# Patient Record
Sex: Female | Born: 1995 | Race: Black or African American | Hispanic: No | Marital: Single | Smoking: Never smoker
Health system: Southern US, Community
[De-identification: ages and names within clinical notes are randomized; demographics above are authoritative.]

## PROBLEM LIST (undated history)

## (undated) ENCOUNTER — Inpatient Hospital Stay (HOSPITAL_COMMUNITY): Payer: Self-pay

## (undated) DIAGNOSIS — F32A Depression, unspecified: Secondary | ICD-10-CM

## (undated) DIAGNOSIS — E669 Obesity, unspecified: Secondary | ICD-10-CM

## (undated) DIAGNOSIS — K8020 Calculus of gallbladder without cholecystitis without obstruction: Secondary | ICD-10-CM

## (undated) DIAGNOSIS — E559 Vitamin D deficiency, unspecified: Secondary | ICD-10-CM

## (undated) DIAGNOSIS — G43109 Migraine with aura, not intractable, without status migrainosus: Secondary | ICD-10-CM

## (undated) DIAGNOSIS — N946 Dysmenorrhea, unspecified: Secondary | ICD-10-CM

## (undated) DIAGNOSIS — F419 Anxiety disorder, unspecified: Secondary | ICD-10-CM

## (undated) HISTORY — DX: Dysmenorrhea, unspecified: N94.6

## (undated) HISTORY — PX: TONSILLECTOMY AND ADENOIDECTOMY: SUR1326

## (undated) HISTORY — PX: OTHER SURGICAL HISTORY: SHX169

## (undated) HISTORY — PX: HERNIA REPAIR: SHX51

## (undated) HISTORY — DX: Migraine with aura, not intractable, without status migrainosus: G43.109

## (undated) HISTORY — DX: Vitamin D deficiency, unspecified: E55.9

---
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
## 2016-03-16 ENCOUNTER — Ambulatory Visit: Payer: Self-pay

## 2016-03-18 ENCOUNTER — Ambulatory Visit (INDEPENDENT_AMBULATORY_CARE_PROVIDER_SITE_OTHER): Payer: BLUE CROSS/BLUE SHIELD

## 2016-03-18 DIAGNOSIS — Z3042 Encounter for surveillance of injectable contraceptive: Secondary | ICD-10-CM | POA: Diagnosis not present

## 2016-03-18 DIAGNOSIS — N912 Amenorrhea, unspecified: Secondary | ICD-10-CM

## 2016-03-18 LAB — POCT URINE PREGNANCY: PREG TEST UR: NEGATIVE

## 2016-03-18 MED ORDER — MEDROXYPROGESTERONE ACETATE 150 MG/ML IM SUSP
150.0000 mg | Freq: Once | INTRAMUSCULAR | Status: AC
  Administered 2016-03-18: 150 mg via INTRAMUSCULAR

## 2016-03-18 NOTE — Progress Notes (Signed)
Patient presents today for 2nd depo provera injection. Her last injection was 13 weeks and 2 days ago on 12/16/15. Per protocol, a urine pregnancy test was performed. Results were negative. Depo Provera given as noted in MAR.

## 2016-03-19 ENCOUNTER — Ambulatory Visit: Payer: Self-pay

## 2016-06-10 ENCOUNTER — Ambulatory Visit: Payer: Self-pay

## 2016-06-16 ENCOUNTER — Ambulatory Visit (INDEPENDENT_AMBULATORY_CARE_PROVIDER_SITE_OTHER): Payer: BLUE CROSS/BLUE SHIELD

## 2016-06-16 DIAGNOSIS — Z3042 Encounter for surveillance of injectable contraceptive: Secondary | ICD-10-CM | POA: Diagnosis not present

## 2016-06-16 MED ORDER — MEDROXYPROGESTERONE ACETATE 150 MG/ML IM SUSP
150.0000 mg | Freq: Once | INTRAMUSCULAR | Status: AC
  Administered 2016-06-16: 150 mg via INTRAMUSCULAR

## 2016-09-08 ENCOUNTER — Ambulatory Visit: Payer: BLUE CROSS/BLUE SHIELD

## 2016-09-21 ENCOUNTER — Ambulatory Visit: Payer: BLUE CROSS/BLUE SHIELD

## 2016-09-22 ENCOUNTER — Ambulatory Visit (INDEPENDENT_AMBULATORY_CARE_PROVIDER_SITE_OTHER): Payer: BLUE CROSS/BLUE SHIELD

## 2016-09-22 DIAGNOSIS — Z308 Encounter for other contraceptive management: Secondary | ICD-10-CM | POA: Diagnosis not present

## 2016-09-22 LAB — POCT PREGNANCY, URINE: Preg Test, Ur: NEGATIVE

## 2016-09-22 MED ORDER — MEDROXYPROGESTERONE ACETATE 150 MG/ML IM SUSP
150.0000 mg | Freq: Once | INTRAMUSCULAR | Status: AC
  Administered 2016-09-22: 150 mg via INTRAMUSCULAR

## 2016-09-22 NOTE — Progress Notes (Signed)
Pt here for depo which was given IM right glut after neg urine preg test.  NDC# [address]-2 

## 2016-11-05 ENCOUNTER — Ambulatory Visit: Payer: BLUE CROSS/BLUE SHIELD | Admitting: Maternal Newborn

## 2016-11-18 ENCOUNTER — Ambulatory Visit: Payer: BLUE CROSS/BLUE SHIELD | Admitting: Maternal Newborn

## 2016-12-15 ENCOUNTER — Ambulatory Visit: Payer: BLUE CROSS/BLUE SHIELD

## 2017-01-05 HISTORY — PX: WISDOM TOOTH EXTRACTION: SHX21

## 2017-02-08 ENCOUNTER — Other Ambulatory Visit: Payer: Self-pay

## 2017-02-08 ENCOUNTER — Emergency Department (HOSPITAL_COMMUNITY)
Admission: EM | Admit: 2017-02-08 | Discharge: 2017-02-08 | Disposition: A | Discharge status: Home / Self Care | Payer: BLUE CROSS/BLUE SHIELD | Attending: Emergency Medicine | Admitting: Emergency Medicine

## 2017-02-08 ENCOUNTER — Encounter (HOSPITAL_COMMUNITY): Payer: Self-pay | Admitting: Emergency Medicine

## 2017-02-08 DIAGNOSIS — K0889 Other specified disorders of teeth and supporting structures: Secondary | ICD-10-CM | POA: Diagnosis not present

## 2017-02-08 MED ORDER — OXYCODONE-ACETAMINOPHEN 5-325 MG PO TABS
1.0000 | ORAL_TABLET | ORAL | Status: DC | PRN
  Administered 2017-02-08: 1 via ORAL
  Filled 2017-02-08: qty 1

## 2017-02-08 MED ORDER — NAPROXEN 500 MG PO TABS: 500.0000 mg | ORAL_TABLET | Freq: Two times a day (BID) | ORAL | 0 refills | Status: DC

## 2017-02-08 NOTE — Discharge Instructions (Signed)
Please go to your follow-up appointment with the oral surgeon.  Please take Naprosyn 500 mg twice a day.  Return to the ER if you have fever greater than 100.4 F, neck swelling, face swelling or have any new or worsening symptoms.

## 2017-02-08 NOTE — ED Triage Notes (Signed)
Pt mother sts pt had 4 wisdom teeth pulled last week. Pt is out of medication and needs something to help. Pt sts that her L ear also has pain along with he throat.

## 2017-02-08 NOTE — ED Provider Notes (Signed)
[facility] Provider Note   CSN: [id number] Arrival date & time: 02/08/17  [id number]     History   Chief Complaint Chief Complaint  Patient presents with  . Dental Pain    HPI Paige Blanchard is a 22 y.o. female.  HPI   Ms. Paige Blanchard is a 22 year old female with a history of migraines who presents to the emergency department for evaluation of bilateral upper and lower mouth pain.  Patient states that she had her 4 wisdom teeth removed 4 days ago and has had persistent pain ever since.  Pain is 10/10 in severity and throbbing.  Her pain radiates into the left ear at times.  Worsened with chewing. Has been taking ibuprofen every 6 hours for pain along with acetaminophen-codeine prescribed by her oral surgeon. Tried percocet in the ER without relief. 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  States that she has a follow-up appointment on Friday.  She denies fevers, chills, facial swelling, neck pain or swelling, dysphagia, trismus.  Past Medical History:  Diagnosis Date  . Dysmenorrhea   . Migraine with aura   . Vitamin D deficiency     There are no active problems to display for this patient.   Past Surgical History:  Procedure Laterality Date  . HERNIA REPAIR     AGE 71  . TONSILLECTOMY AND ADENOIDECTOMY    . TUBES IN EAR      OB History    Gravida Para Term Preterm AB Living   0 0 0 0 0 0   SAB TAB Ectopic Multiple Live Births   0 0 0 0 0       Home Medications    Prior to Admission medications   Not on File    Family History Family History  Problem Relation Age of Onset  . Diabetes Mother   . Hypertension Mother   . Heart disease Father   . Diabetes Maternal Grandmother        TYPE 2  . Diabetes Maternal Grandfather        TYPE 2  . Breast cancer Other   . Cancer Other        PROSTATE    Social History Social History   Tobacco Use  . Smoking status: Never Smoker  . Smokeless tobacco:  Never Used  Substance Use Topics  . Alcohol use: No    Frequency: Never  . Drug use: No     Allergies   Peanut oil   Review of Systems Review of Systems  Constitutional: Negative for chills and fever.  HENT: Positive for dental problem and ear pain. Negative for ear discharge, facial swelling, hearing loss and trouble swallowing.   Respiratory: Negative for shortness of breath.      Physical Exam Updated Vital Signs BP (!) 137/92 (BP Location: Right Arm)   Pulse (!) 109   Temp 98.2 F (36.8 C) (Oral)   SpO2 99%   Physical Exam  Constitutional: She appears well-developed and well-nourished. No distress.  HENT:  Head: Normocephalic and atraumatic.  Mouth/Throat:    Wisdom teeth absent. Pain along gums as depicted in image. No gingival erythema or abscess noted. Midline uvula. No trismus. OP moist and clear. No oropharyngeal erythema or edema. Neck supple with no tenderness. No facial edema.  Eyes: Conjunctivae are normal. Pupils are equal, round, and reactive to light. Right eye exhibits no discharge. Left eye exhibits no discharge.  Neck:  Normal range of motion. Neck supple.  Cardiovascular: Normal rate and regular rhythm. Exam reveals no friction rub.  No murmur heard. Pulmonary/Chest: Effort normal and breath sounds normal. No stridor. No respiratory distress. She has no wheezes. She has no rales.  Lymphadenopathy:    She has no cervical adenopathy.  Neurological: She is alert. Coordination normal.  Skin: She is not diaphoretic.  Psychiatric: She has a normal mood and affect. Her behavior is normal.  Nursing note and vitals reviewed.    ED Treatments / Results  Labs (all labs ordered are listed, but only abnormal results are displayed) Labs Reviewed - No data to display  EKG  EKG Interpretation None       Radiology No results found.  Procedures Procedures (including critical care time)  Medications Ordered in ED Medications    oxyCODONE-acetaminophen (PERCOCET/ROXICET) 5-325 MG per tablet 1 tablet (1 tablet Oral Given 02/08/17 1028)     Initial Impression / Assessment and Plan / ED Course  I have reviewed the triage vital signs and the nursing notes.  Pertinent labs & imaging results that were available during my care of the patient were reviewed by me and considered in my medical decision making (see chart for details).    Patient with dental pain after wisdom tooth extraction four days ago.  No gross abscess or signs of infection.  Exam unconcerning for Ludwig's angina or spread of infection.  Will treat with anti-inflammatories medicine and have her follow up with oral surgeon for further evaluation and pain management.  Discussed return precautions and patient agrees to plan.     Final Clinical Impressions(s) / ED Diagnoses   Final diagnoses:  Pain, dental    ED Discharge Orders        Ordered    naproxen (NAPROSYN) 500 MG tablet  2 times daily     02/08/17 1407       Lawrence Marseilles 02/08/17 1409    Raeford Razor, MD 02/09/17 [phone]

## 2017-03-03 ENCOUNTER — Encounter (HOSPITAL_COMMUNITY): Payer: Self-pay | Admitting: Emergency Medicine

## 2017-03-03 ENCOUNTER — Emergency Department (HOSPITAL_COMMUNITY)
Admission: EM | Admit: 2017-03-03 | Discharge: 2017-03-03 | Disposition: A | Discharge status: Home / Self Care | Payer: BLUE CROSS/BLUE SHIELD | Attending: Emergency Medicine | Admitting: Emergency Medicine

## 2017-03-03 DIAGNOSIS — R55 Syncope and collapse: Secondary | ICD-10-CM | POA: Insufficient documentation

## 2017-03-03 DIAGNOSIS — Z5321 Procedure and treatment not carried out due to patient leaving prior to being seen by health care provider: Secondary | ICD-10-CM | POA: Insufficient documentation

## 2017-03-03 LAB — BASIC METABOLIC PANEL
ANION GAP: 8 (ref 5–15)
BUN: 14 mg/dL (ref 6–20)
CHLORIDE: 107 mmol/L (ref 101–111)
CO2: 24 mmol/L (ref 22–32)
Calcium: 9.1 mg/dL (ref 8.9–10.3)
Creatinine, Ser: 0.92 mg/dL (ref 0.44–1.00)
GFR calc Af Amer: >60 mL/min (ref >60)
GFR calc non Af Amer: >60 mL/min (ref >60)
Glucose, Bld: 113 mg/dL — ABNORMAL HIGH (ref 65–99)
POTASSIUM: 4.2 mmol/L (ref 3.5–5.1)
SODIUM: 139 mmol/L (ref 135–145)

## 2017-03-03 LAB — URINALYSIS, ROUTINE W REFLEX MICROSCOPIC
Bilirubin Urine: NEGATIVE
GLUCOSE, UA: NEGATIVE mg/dL
Hgb urine dipstick: NEGATIVE
Ketones, ur: NEGATIVE mg/dL
LEUKOCYTES UA: NEGATIVE
Nitrite: NEGATIVE
Protein, ur: NEGATIVE mg/dL
SPECIFIC GRAVITY, URINE: 1.018 (ref 1.005–1.030)
pH: 6 (ref 5.0–8.0)

## 2017-03-03 LAB — CBC
HCT: 38.3 % (ref 36.0–46.0)
HEMOGLOBIN: 13.2 g/dL (ref 12.0–15.0)
MCH: 32.6 pg (ref 26.0–34.0)
MCHC: 34.5 g/dL (ref 30.0–36.0)
MCV: 94.6 fL (ref 78.0–100.0)
Platelets: 268 10*3/uL (ref 150–400)
RBC: 4.05 MIL/uL (ref 3.87–5.11)
RDW: 13.3 % (ref 11.5–15.5)
WBC: 4.8 10*3/uL (ref 4.0–10.5)

## 2017-03-03 LAB — I-STAT BETA HCG BLOOD, ED (MC, WL, AP ONLY)

## 2017-03-03 MED ORDER — ONDANSETRON 4 MG PO TBDP
4.0000 mg | ORAL_TABLET | Freq: Once | ORAL | Status: AC
  Administered 2017-03-03: 4 mg via ORAL
  Filled 2017-03-03: qty 1

## 2017-03-03 NOTE — ED Notes (Signed)
Pt decided to leave without being seen by a Dr. Rock NephewPt was urged to stay but she refused.

## 2017-03-03 NOTE — ED Triage Notes (Signed)
Pt reports she had syncopal episode lasting approx 5 minutes. Witnessed by her roommate. Pt now states she is dizzy, lightheaded, nauseous. Pt dry heaving in triage. Pt also reports HA, states she has hx of migraines.

## 2017-03-04 NOTE — ED Notes (Signed)
03/04/2017, 15:20, follow -up call completed.

## 2017-03-14 HISTORY — PX: OTHER SURGICAL HISTORY: SHX169

## 2017-03-19 ENCOUNTER — Ambulatory Visit: Payer: BLUE CROSS/BLUE SHIELD | Admitting: Maternal Newborn

## 2017-03-24 ENCOUNTER — Encounter: Payer: Self-pay | Admitting: Maternal Newborn

## 2017-03-24 ENCOUNTER — Ambulatory Visit (INDEPENDENT_AMBULATORY_CARE_PROVIDER_SITE_OTHER): Payer: BLUE CROSS/BLUE SHIELD | Admitting: Maternal Newborn

## 2017-03-24 VITALS — BP 120/90 | HR 84 | Ht 64.0 in | Wt 288.0 lb

## 2017-03-24 DIAGNOSIS — Z3009 Encounter for other general counseling and advice on contraception: Secondary | ICD-10-CM | POA: Diagnosis not present

## 2017-03-24 NOTE — Progress Notes (Signed)
Obstetrics & Gynecology Office Visit   Chief Complaint:  Chief Complaint  Patient presents with  . Contraception    overdue for annual    History of Present Illness: Patient stopped Depo Provera in December. Does not desire to restart due to weight gain and other side effects. Not a candidate for estrogen containing contraception as she has migraines with aura. Wants to discuss other forms of birth control.  Review of Systems: 10 point review of systems negative unless otherwise noted in HPI.  Past Medical History:  Past Medical History:  Diagnosis Date  . Dysmenorrhea   . Migraine with aura   . Vitamin D deficiency     Past Surgical History:  Past Surgical History:  Procedure Laterality Date  . abcessed tooth  03/14/2017   drained in ED of [facility]  . HERNIA REPAIR     AGE 22  . TONSILLECTOMY AND ADENOIDECTOMY    . TUBES IN EAR    . WISDOM TOOTH EXTRACTION  01/2017    Gynecologic History: Patient's last menstrual period was 03/01/2017.  Obstetric History: G0P0000  Family History:  Family History  Problem Relation Age of Onset  . Diabetes Mother   . Hypertension Mother   . Heart disease Father   . Diabetes Maternal Grandmother        TYPE 2  . Diabetes Maternal Grandfather        TYPE 2  . Breast cancer Other   . Cancer Other        PROSTATE    Social History:  Social History   Socioeconomic History  . Marital status: Single    Spouse name: Not on file  . Number of children: 0  . Years of education: Not on file  . Highest education level: Not on file  Occupational History  . Not on file  Social Needs  . Financial resource strain: Not on file  . Food insecurity:    Worry: Not on file    Inability: Not on file  . Transportation needs:    Medical: Not on file    Non-medical: Not on file  Tobacco Use  . Smoking status: Never Smoker  . Smokeless tobacco: Never Used  Substance and Sexual Activity  . Alcohol use: No    Frequency: Never  . Drug  use: No  . Sexual activity: Yes    Birth control/protection: Injection, None  Lifestyle  . Physical activity:    Days per week: Not on file    Minutes per session: Not on file  . Stress: Not on file  Relationships  . Social connections:    Talks on phone: Not on file    Gets together: Not on file    Attends religious service: Not on file    Active member of club or organization: Not on file    Attends meetings of clubs or organizations: Not on file    Relationship status: Not on file  . Intimate partner violence:    Fear of current or ex partner: Not on file    Emotionally abused: Not on file    Physically abused: Not on file    Forced sexual activity: Not on file  Other Topics Concern  . Not on file  Social History Narrative  . Not on file    Allergies:  Allergies  Allergen Reactions  . Peanut Oil Hives    Medications: Prior to Admission medications   Medication Sig Start Date End Date Taking? Authorizing Provider  naproxen (NAPROSYN) 500 MG tablet Take 1 tablet (500 mg total) by mouth 2 (two) times daily. 02/08/17   Kellie Shropshire, PA-C    Physical Exam Vitals:  Vitals:   03/24/17 1109  BP: 120/90  Pulse: 84   Patient's last menstrual period was 03/01/2017.  General: NAD HEENT: normocephalic, anicteric Pulmonary: No increased work of breathing Genitourinary: deferred Neurologic: Grossly intact Psychiatric: mood appropriate, affect full  Assessment: 22 y.o. G0P0000 presents for contraceptive counseling.  Plan: Problem List Items Addressed This Visit    None    Visit Diagnoses    Encounter for other general counseling or advice on contraception    -  Primary     Discussed birth control options including over the counter/barrier methods; hormonal contraceptive medication including injection, contraceptive implant; and hormonal and nonhormonal IUDs. Efficacy, risks, side effects and benefits reviewed.  Questions were answered.  Written information and  brochures were given to patient to review. She is currently considering a [place] IUD. Advised to use condoms until she makes her decision.  A total of 15 minutes were spent in face-to-face contact with the patient during this encounter with over half of that time devoted to counseling and coordination of care.  Marcelyn Bruins, CNM 03/24/2017

## 2017-03-30 ENCOUNTER — Encounter: Payer: Self-pay | Admitting: Maternal Newborn

## 2017-04-05 ENCOUNTER — Ambulatory Visit: Payer: BLUE CROSS/BLUE SHIELD | Admitting: Maternal Newborn

## 2017-04-22 ENCOUNTER — Encounter: Payer: Self-pay | Admitting: Obstetrics and Gynecology

## 2017-04-22 ENCOUNTER — Ambulatory Visit (INDEPENDENT_AMBULATORY_CARE_PROVIDER_SITE_OTHER): Payer: BLUE CROSS/BLUE SHIELD | Admitting: Obstetrics and Gynecology

## 2017-04-22 VITALS — BP 126/74 | Ht 64.0 in | Wt 288.0 lb

## 2017-04-22 DIAGNOSIS — Z1339 Encounter for screening examination for other mental health and behavioral disorders: Secondary | ICD-10-CM | POA: Diagnosis not present

## 2017-04-22 DIAGNOSIS — Z01419 Encounter for gynecological examination (general) (routine) without abnormal findings: Secondary | ICD-10-CM | POA: Diagnosis not present

## 2017-04-22 DIAGNOSIS — N926 Irregular menstruation, unspecified: Secondary | ICD-10-CM | POA: Diagnosis not present

## 2017-04-22 DIAGNOSIS — Z124 Encounter for screening for malignant neoplasm of cervix: Secondary | ICD-10-CM | POA: Diagnosis not present

## 2017-04-22 DIAGNOSIS — Z1331 Encounter for screening for depression: Secondary | ICD-10-CM

## 2017-04-22 DIAGNOSIS — Z113 Encounter for screening for infections with a predominantly sexual mode of transmission: Secondary | ICD-10-CM | POA: Diagnosis not present

## 2017-04-22 LAB — POCT URINE PREGNANCY: Preg Test, Ur: NEGATIVE

## 2017-04-22 NOTE — Progress Notes (Signed)
Gynecology Annual Exam  PCP: Patient, No Pcp Per  Chief Complaint  Patient presents with  . Annual Exam   History of Present Illness:  Ms. Hilma FavorsKeziah R Ketterman is a 22 y.o. G0P0000 who LMP was Patient's last menstrual period was 03/07/2017., presents today for her annual examination.  Her menses are regular every 24 days, lasting 5 day(s).  Dysmenorrhea mild, occurring first 1-2 days of flow. She does not have intermenstrual bleeding. She is late for her most recent period. She took one home UPT that was positive, one that was "unresponsive" and one that was negative. She did pass some big clots on 03/16/17.  Her last depo provera dose in 12/2016 after taking it for one year.    She is single partner, contraception - none.  Last Pap: never   Hx of STDs: none  There is no FH of breast cancer. There is no FH of ovarian cancer. The patient does not do self-breast exams.  Tobacco use: The patient denies current or previous tobacco use. Alcohol use: none Exercise: moderately active  The patient wears seatbelts: yes.   The patient reports that domestic violence in her life is absent.   Past Medical History:  Diagnosis Date  . Dysmenorrhea   . Migraine with aura   . Vitamin D deficiency     Past Surgical History:  Procedure Laterality Date  . abcessed tooth  03/14/2017   drained in ED of [facility]  . HERNIA REPAIR     AGE 22  . TONSILLECTOMY AND ADENOIDECTOMY    . TUBES IN EAR    . WISDOM TOOTH EXTRACTION  01/2017    Medications   Medication Sig Start Date End Date Taking? Authorizing Provider  albuterol (PROVENTIL HFA) 108 (90 Base) MCG/ACT inhaler Inhale 2 puffs into the lungs as needed. 03/12/15  Yes [provider]  Cholecalciferol (VITAMIN D3) 1000 units CAPS Take 1 capsule by mouth daily. 11/15/16 11/15/17 Yes [provider]  topiramate (TOPAMAX) 50 MG tablet Take 200 mg by mouth daily. 03/17/17 03/17/18 Yes [provider]    Allergies  Allergen  Reactions  . Peanut Oil Hives   Obstetric History: G0P0000  Social History   Socioeconomic History  . Marital status: Single    Spouse name: Not on file  . Number of children: 0  . Years of education: Not on file  . Highest education level: Not on file  Occupational History  . Not on file  Social Needs  . Financial resource strain: Not on file  . Food insecurity:    Worry: Not on file    Inability: Not on file  . Transportation needs:    Medical: Not on file    Non-medical: Not on file  Tobacco Use  . Smoking status: Never Smoker  . Smokeless tobacco: Never Used  Substance and Sexual Activity  . Alcohol use: No    Frequency: Never  . Drug use: No  . Sexual activity: Yes    Birth control/protection: None  Lifestyle  . Physical activity:    Days per week: Not on file    Minutes per session: Not on file  . Stress: Not on file  Relationships  . Social connections:    Talks on phone: Not on file    Gets together: Not on file    Attends religious service: Not on file    Active member of club or organization: Not on file    Attends meetings of clubs or  organizations: Not on file    Relationship status: Not on file  . Intimate partner violence:    Fear of current or ex partner: Not on file    Emotionally abused: Not on file    Physically abused: Not on file    Forced sexual activity: Not on file  Other Topics Concern  . Not on file  Social History Narrative  . Not on file    Family History  Problem Relation Age of Onset  . Diabetes Mother   . Hypertension Mother   . Heart disease Father   . Diabetes Maternal Grandmother        TYPE 2  . Diabetes Maternal Grandfather        TYPE 2  . Breast cancer Other   . Cancer Other        PROSTATE    Review of Systems  Constitutional: Negative.   HENT: Negative.   Eyes: Negative.   Respiratory: Negative.   Cardiovascular: Negative.   Gastrointestinal: Negative.   Genitourinary: Negative.   Musculoskeletal:  Negative.   Skin: Negative.   Neurological: Negative.   Psychiatric/Behavioral: Negative.      Physical Exam BP 126/74   Ht 5\' 4"  (1.626 m)   Wt 288 lb (130.6 kg)   LMP 03/07/2017   BMI 49.44 kg/m    Physical Exam  Constitutional: She is oriented to person, place, and time. She appears well-developed and well-nourished. No distress.  Genitourinary: Uterus normal. Pelvic exam was performed with patient supine. There is no rash, tenderness, lesion or injury on the right labia. There is no rash, tenderness, lesion or injury on the left labia. No erythema, tenderness or bleeding in the vagina. No signs of injury around the vagina. No vaginal discharge found. Right adnexum does not display mass, does not display tenderness and does not display fullness. Left adnexum does not display mass, does not display tenderness and does not display fullness. Cervix does not exhibit motion tenderness, lesion, discharge or polyp.   Uterus is mobile and anteverted. Uterus is not enlarged, tender or exhibiting a mass.  HENT:  Head: Normocephalic and atraumatic.  Eyes: EOM are normal. No scleral icterus.  Neck: Normal range of motion. Neck supple. No thyromegaly present.  Cardiovascular: Normal rate and regular rhythm. Exam reveals no gallop and no friction rub.  No murmur heard. Pulmonary/Chest: Effort normal and breath sounds normal. No respiratory distress. She has no wheezes. She has no rales. Right breast exhibits no inverted nipple, no mass, no nipple discharge, no skin change and no tenderness. Left breast exhibits no inverted nipple, no mass, no nipple discharge, no skin change and no tenderness.  Abdominal: Soft. Bowel sounds are normal. She exhibits no distension and no mass. There is no tenderness. There is no rebound and no guarding.  Musculoskeletal: Normal range of motion. She exhibits no edema or tenderness.  Lymphadenopathy:    She has no cervical adenopathy.       Right: No inguinal  adenopathy present.       Left: No inguinal adenopathy present.  Neurological: She is alert and oriented to person, place, and time. No cranial nerve deficit.  Skin: Skin is warm and dry. No rash noted. No erythema.  Psychiatric: She has a normal mood and affect. Her behavior is normal. Judgment normal.   Female chaperone present for pelvic and breast  portions of the physical exam  Results: AUDIT Questionnaire (screen for alcoholism): 0 PHQ-9: 8  Lab Results  Component Value  Date   PREGTESTUR Negative 04/22/2017   Assessment: 22 y.o. G0P0000 female here for routine annual gynecologic examination  Plan: Problem List Items Addressed This Visit    None    Visit Diagnoses    Missed period    -  Primary   Relevant Orders   POCT urine pregnancy (Completed)   Women's annual routine gynecological examination       Screening for depression       Screening for alcoholism       Pap smear for cervical cancer screening       Screen for STD (sexually transmitted disease)         Screening: -- Blood pressure screen normal -- Weight screening: obese: discussed management options, including lifestyle, dietary, and exercise. -- Depression screening negative (PHQ-9) -- Nutrition: normal -- cholesterol screening: not due for screening -- osteoporosis screening: not due -- tobacco screening: not using -- alcohol screening: AUDIT questionnaire indicates low-risk usage. -- family history of breast cancer screening: done. not at high risk. -- no evidence of domestic violence or intimate partner violence. -- STD screening: gonorrhea/chlamydia NAAT collected -- pap smear collected per ASCCP guidelines   Thomasene Mohair, MD 04/22/2017 10:06 AM

## 2017-04-26 ENCOUNTER — Other Ambulatory Visit: Payer: Self-pay

## 2017-04-26 ENCOUNTER — Encounter (HOSPITAL_COMMUNITY): Payer: Self-pay

## 2017-04-26 ENCOUNTER — Ambulatory Visit (HOSPITAL_COMMUNITY)
Admission: EM | Admit: 2017-04-26 | Discharge: 2017-04-26 | Disposition: A | Discharge status: Home / Self Care | Payer: BLUE CROSS/BLUE SHIELD | Attending: Family Medicine | Admitting: Family Medicine

## 2017-04-26 DIAGNOSIS — H6690 Otitis media, unspecified, unspecified ear: Secondary | ICD-10-CM

## 2017-04-26 DIAGNOSIS — R05 Cough: Secondary | ICD-10-CM

## 2017-04-26 DIAGNOSIS — J321 Chronic frontal sinusitis: Secondary | ICD-10-CM

## 2017-04-26 DIAGNOSIS — R059 Cough, unspecified: Secondary | ICD-10-CM

## 2017-04-26 MED ORDER — AMOXICILLIN-POT CLAVULANATE 875-125 MG PO TABS: 1.0000 | ORAL_TABLET | Freq: Two times a day (BID) | ORAL | 0 refills | Status: AC

## 2017-04-26 NOTE — Discharge Instructions (Signed)
Get plenty of rest and push fluids Use OTC medication as needed for symptomatic relief (afrin or sudafed for congestion, antihistamine or allergy medication for runny nose, tylenol/ibuprofen for pain) Take antibiotic as directed and to completion Follow up with PCP if symptoms persist Return or go to ER if you have any new or worsening symptoms

## 2017-04-26 NOTE — ED Provider Notes (Signed)
[facility]    CSN: [id number] Arrival date & time: 04/26/17  1010     History   Chief Complaint Chief Complaint  Patient presents with  . Nasal Congestion  . Otalgia    HPI Paige Blanchard is a 22 y.o. female.   Patient complains of chills, congestion, sinus headache and left ear ache for 5 days. Patient also reports dry cough that began yesterday.  She denies sick contacts or precipitating event.  She has tried theraflu without relief.  Her symptoms are made worse at night.  She reports history of chronic ear infections as a child and had tubes placed in both ears.          Past Medical History:  Diagnosis Date  . Dysmenorrhea   . Migraine with aura   . Vitamin D deficiency     Patient Active Problem List   Diagnosis Date Noted  . Recurrent major depressive disorder (HCC) 10/26/2014  . Class 3 severe obesity due to excess calories without serious comorbidity with body mass index (BMI) of 50.0 to 59.9 in adult (HCC) 11/14/2013  . Migraine 12/17/2011    Past Surgical History:  Procedure Laterality Date  . abcessed tooth  03/14/2017   drained in ED of [facility]  . HERNIA REPAIR     AGE 71  . TONSILLECTOMY AND ADENOIDECTOMY    . TUBES IN EAR    . WISDOM TOOTH EXTRACTION  01/2017    OB History    Gravida  0   Para  0   Term  0   Preterm  0   AB  0   Living  0     SAB  0   TAB  0   Ectopic  0   Multiple  0   Live Births  0            Home Medications    Prior to Admission medications   Medication Sig Start Date End Date Taking? Authorizing Provider  albuterol (PROVENTIL HFA) 108 (90 Base) MCG/ACT inhaler Inhale 2 puffs into the lungs as needed. 03/12/15  Yes [provider]  Cholecalciferol (VITAMIN D3) 1000 units CAPS Take 1 capsule by mouth daily. 11/15/16 11/15/17 Yes [provider]  topiramate (TOPAMAX) 50 MG tablet Take 200 mg by mouth daily. 03/17/17 03/17/18 Yes [provider]    amoxicillin-clavulanate (AUGMENTIN) 875-125 MG tablet Take 1 tablet by mouth every 12 (twelve) hours for 10 days. 04/26/17 05/06/17  Rennis Harding, PA-C    Family History Family History  Problem Relation Age of Onset  . Diabetes Mother   . Hypertension Mother   . Heart disease Father   . Diabetes Maternal Grandmother        TYPE 2  . Diabetes Maternal Grandfather        TYPE 2  . Breast cancer Other   . Cancer Other        PROSTATE    Social History Social History   Tobacco Use  . Smoking status: Never Smoker  . Smokeless tobacco: Never Used  Substance Use Topics  . Alcohol use: No    Frequency: Never  . Drug use: No     Allergies   Peanut oil   Review of Systems Review of Systems  Constitutional: Positive for chills and fatigue. Negative for fever.  HENT: Positive for congestion, ear pain, postnasal drip, rhinorrhea, sinus pressure, sinus pain and sneezing. Negative for ear discharge and sore throat.   Respiratory:  Positive for cough. Negative for shortness of breath and wheezing.   Cardiovascular: Negative for chest pain.  Gastrointestinal: Positive for nausea. Negative for abdominal pain and vomiting.  Genitourinary: Negative for difficulty urinating and dysuria.     Physical Exam Triage Vital Signs ED Triage Vitals  Enc Vitals Group     BP 04/26/17 1114 (!) 111/50     Pulse Rate 04/26/17 1111 99     Resp 04/26/17 1111 18     Temp 04/26/17 1111 98.3 F (36.8 C)     Temp src --      SpO2 04/26/17 1111 98 %     Weight 04/26/17 1108 288 lb (130.6 kg)     Height --      Head Circumference --      Peak Flow --      Pain Score 04/26/17 1108 10     Pain Loc --      Pain Edu? --      Excl. in GC? --    No data found.  Updated Vital Signs BP (!) 111/50   Pulse 99   Temp 98.3 F (36.8 C)   Resp 18   Wt 288 lb (130.6 kg)   LMP 03/12/2017   SpO2 98%   BMI 49.44 kg/m      Physical Exam  Constitutional: She is oriented to person, place, and  time. She appears well-developed and well-nourished. No distress.  HENT:  Head: Normocephalic and atraumatic.  Right Ear: Hearing, external ear and ear canal normal. No lacerations. No drainage or tenderness. No foreign bodies. No mastoid tenderness. Tympanic membrane is scarred. Tympanic membrane is not erythematous.  Left Ear: Hearing and external ear normal. No lacerations. No drainage or tenderness. No foreign bodies. No mastoid tenderness. Tympanic membrane is scarred and erythematous.  Nose: Rhinorrhea present. Right sinus exhibits maxillary sinus tenderness and frontal sinus tenderness. Left sinus exhibits maxillary sinus tenderness and frontal sinus tenderness.  Mouth/Throat: Uvula is midline and mucous membranes are normal. Uvula swelling present. No oropharyngeal exudate.  Bilateral TMs show scarring.  LT ear has mild erythema.  LT EAC mild cerumen present.    Eyes: Pupils are equal, round, and reactive to light. Conjunctivae and EOM are normal.  Neck: Normal range of motion. Neck supple.  Cardiovascular: Normal rate, regular rhythm and normal heart sounds. Exam reveals no gallop and no friction rub.  No murmur heard. Pulmonary/Chest: Effort normal and breath sounds normal. No respiratory distress. She has no wheezes. She has no rales.  Lymphadenopathy:    She has cervical adenopathy.  Neurological: She is alert and oriented to person, place, and time.  Skin: Skin is warm and dry. Capillary refill takes 2 to 3 seconds. She is not diaphoretic.  Psychiatric: She has a normal mood and affect. Her behavior is normal. Judgment and thought content normal.  Nursing note and vitals reviewed.    UC Treatments / Results  Labs (all labs ordered are listed, but only abnormal results are displayed) Labs Reviewed - No data to display  EKG None Radiology No results found.  Procedures Procedures (including critical care time)  Medications Ordered in UC Medications - No data to  display   Initial Impression / Assessment and Plan / UC Course  I have reviewed the triage vital signs and the nursing notes.  Pertinent labs & imaging results that were available during my care of the patient were reviewed by me and considered in my medical decision making (see chart for  details).     Discussed with patient history and exam most consistent with otitis media and sinusitis. Augmentin 875 prescribed. Symptomatic treatment as needed. Push fluids. Return and ER precautions given.   Final Clinical Impressions(s) / UC Diagnoses   Final diagnoses:  Acute otitis media, unspecified otitis media type  Chronic frontal sinusitis  Cough    ED Discharge Orders        Ordered    amoxicillin-clavulanate (AUGMENTIN) 875-125 MG tablet  Every 12 hours     04/26/17 1134       Controlled Substance Prescriptions [place] Controlled Substance Registry consulted? Not Applicable   Rennis Harding, [place] 04/26/17 1146

## 2017-04-26 NOTE — ED Triage Notes (Signed)
Presents with complaints of chills, congestions, headache and ear ache x 5 days.

## 2017-04-27 LAB — IGP, CTNG, RFX APTIMA HPV ASCU
Chlamydia, Nuc. Acid Amp: NEGATIVE
Gonococcus by Nucleic Acid Amp: NEGATIVE
PAP SMEAR COMMENT: 0

## 2017-05-07 ENCOUNTER — Telehealth: Payer: Self-pay | Admitting: Obstetrics and Gynecology

## 2017-05-07 NOTE — Telephone Encounter (Signed)
Discussed LGSIL pap smear with her. Per ASCCP guidelines, recommendation for repeat pap smear in one year. Discussed importance of follow up and risk of cancer with no follow up. She voiced understanding and agreement.

## 2017-05-21 DIAGNOSIS — R87612 Low grade squamous intraepithelial lesion on cytologic smear of cervix (LGSIL): Secondary | ICD-10-CM | POA: Insufficient documentation

## 2017-05-26 ENCOUNTER — Encounter: Payer: Self-pay | Admitting: Obstetrics and Gynecology

## 2017-05-26 ENCOUNTER — Ambulatory Visit (INDEPENDENT_AMBULATORY_CARE_PROVIDER_SITE_OTHER): Payer: BLUE CROSS/BLUE SHIELD | Admitting: Obstetrics and Gynecology

## 2017-05-26 VITALS — BP 128/82 | HR 94 | Ht 64.0 in | Wt 294.5 lb

## 2017-05-26 DIAGNOSIS — N644 Mastodynia: Secondary | ICD-10-CM | POA: Insufficient documentation

## 2017-05-26 DIAGNOSIS — N938 Other specified abnormal uterine and vaginal bleeding: Secondary | ICD-10-CM

## 2017-05-26 DIAGNOSIS — Z3202 Encounter for pregnancy test, result negative: Secondary | ICD-10-CM

## 2017-05-26 LAB — POCT URINE PREGNANCY: Preg Test, Ur: NEGATIVE

## 2017-05-26 NOTE — Patient Instructions (Signed)
I value your feedback and entrusting us with your care. If you get a [facility] patient survey, I would appreciate you taking the time to let us know about your experience today. Thank you! 

## 2017-05-26 NOTE — Progress Notes (Signed)
Patient, No Pcp Per   Chief Complaint  Patient presents with  . Menstrual Problem    LMP started on 05/11/17 and is still on her period     HPI:      Ms. Paige Blanchard is a 22 y.o. G0P0000 who LMP was Patient's last menstrual period was 05/11/2017 (exact date)., presents today for irreg bleeding. Pt started bleeding 05/11/17 and has been bleeding since. Changing pads/tampons about QID. Blood is red or dark brown/black. Pt missed 4/19 menses. Last depo given 12/18. Had neg pap/STD testing 4/19. Did have menses  monthly 2/19 and 3/19, lasting 5 days. Pt is sex active, not using BC/condoms. She had wt gain with depo and is working with PCP to lose wt. Doesn't want hormones. Has also noticed breast/nipple pain bilat for the past month. No masses, nipple d/c. Denies caffeine use.    Past Medical History:  Diagnosis Date  . Dysmenorrhea   . Migraine with aura   . Vitamin D deficiency     Past Surgical History:  Procedure Laterality Date  . abcessed tooth  03/14/2017   drained in ED of [facility]  . HERNIA REPAIR     AGE 64  . TONSILLECTOMY AND ADENOIDECTOMY    . TUBES IN EAR    . WISDOM TOOTH EXTRACTION  01/2017    Family History  Problem Relation Age of Onset  . Diabetes Mother   . Hypertension Mother   . Heart disease Father   . Diabetes Maternal Grandmother        TYPE 2  . Diabetes Maternal Grandfather        TYPE 2  . Breast cancer Other   . Cancer Other        PROSTATE    Social History   Socioeconomic History  . Marital status: Single    Spouse name: Not on file  . Number of children: 0  . Years of education: Not on file  . Highest education level: Not on file  Occupational History  . Not on file  Social Needs  . Financial resource strain: Not on file  . Food insecurity:    Worry: Not on file    Inability: Not on file  . Transportation needs:    Medical: Not on file    Non-medical: Not on file  Tobacco Use  . Smoking status: Never Smoker  . Smokeless  tobacco: Never Used  Substance and Sexual Activity  . Alcohol use: No    Frequency: Never  . Drug use: No  . Sexual activity: Yes    Birth control/protection: None  Lifestyle  . Physical activity:    Days per week: Not on file    Minutes per session: Not on file  . Stress: Not on file  Relationships  . Social connections:    Talks on phone: Not on file    Gets together: Not on file    Attends religious service: Not on file    Active member of club or organization: Not on file    Attends meetings of clubs or organizations: Not on file    Relationship status: Not on file  . Intimate partner violence:    Fear of current or ex partner: Not on file    Emotionally abused: Not on file    Physically abused: Not on file    Forced sexual activity: Not on file  Other Topics Concern  . Not on file  Social History Narrative  . Not  on file    Outpatient Medications Prior to Visit  Medication Sig Dispense Refill  . albuterol (PROVENTIL HFA) 108 (90 Base) MCG/ACT inhaler Inhale 2 puffs into the lungs as needed.    Marland Kitchen buPROPion (WELLBUTRIN XL) 150 MG 24 hr tablet Take by mouth.    . topiramate (TOPAMAX) 100 MG tablet     . Cholecalciferol (VITAMIN D3) 1000 units CAPS Take 1 capsule by mouth daily.    Marland Kitchen topiramate (TOPAMAX) 50 MG tablet Take 200 mg by mouth daily.     No facility-administered medications prior to visit.     ROS:  Review of Systems  Constitutional: Negative for fever.  Gastrointestinal: Negative for blood in stool, constipation, diarrhea, nausea and vomiting.  Genitourinary: Positive for menstrual problem and vaginal pain. Negative for dyspareunia, dysuria, flank pain, frequency, hematuria, urgency, vaginal bleeding and vaginal discharge.  Musculoskeletal: Negative for back pain.  Skin: Negative for rash.   BREAST: tenderness, nipple pain  OBJECTIVE:   Vitals:  BP 128/82   Pulse 94   Ht  (1.626 m)   Wt 294 lb 8 oz (133.6 kg)   LMP 05/11/2017 (Exact Date)    BMI 50.55 kg/m   Physical Exam  Constitutional: She is oriented to person, place, and time. Vital signs are normal. She appears well-developed.  Pulmonary/Chest: Effort normal. Right breast exhibits tenderness. Right breast exhibits no inverted nipple, no mass, no nipple discharge and no skin change. Left breast exhibits tenderness. Left breast exhibits no inverted nipple, no mass, no nipple discharge and no skin change. Breasts are symmetrical.  Genitourinary: Uterus normal. There is no rash, tenderness or lesion on the right labia. There is no rash, tenderness or lesion on the left labia. Uterus is not enlarged and not tender. Cervix exhibits no motion tenderness. Right adnexum displays no mass and no tenderness. Left adnexum displays no mass and no tenderness. There is bleeding in the vagina. No erythema or tenderness in the vagina. No vaginal discharge found.  Musculoskeletal: Normal range of motion.  Lymphadenopathy:    She has no axillary adenopathy.  Neurological: She is alert and oriented to person, place, and time.  Psychiatric: She has a normal mood and affect. Her behavior is normal. Judgment and thought content normal.  Vitals reviewed.   Results: Results for orders placed or performed in visit on 05/26/17 (from the past 24 hour(s))  POCT urine pregnancy     Status: Normal   Collection Time: 05/26/17  1:46 PM  Result Value Ref Range   Preg Test, Ur Negative Negative    Assessment/Plan: DUB (dysfunctional uterine bleeding) - For 1 cycle/neg UPT. Last depo given 12/18- most likely related to hormonal changes wtih depo. F/u if sx persist. Discussed short term OCPs for cycle control. - Plan: POCT urine pregnancy  Mastodynia - Neg exam except tenderness. Most likely related to hormonal changes with depo. NSAIDs. F/u prn.     Return if symptoms worsen or fail to improve.  Yoshiko Keleher B. Baraa Tubbs, PA-C 05/26/2017 4:59 PM

## 2017-07-13 ENCOUNTER — Telehealth: Payer: Self-pay | Admitting: Obstetrics and Gynecology

## 2017-07-13 NOTE — Telephone Encounter (Signed)
Pt aware he periods are probably still trying to get regular from being on the depo. She states its not heavy, and I told her she could come in and try some other type of BC to get her periods on a regular schedule and declined, please advise if there is anything else I should recommend

## 2017-07-13 NOTE — Telephone Encounter (Signed)
RN response if correct. Had neg eval 5/19. Last depo 12/18. Still getting out of her system.

## 2017-07-13 NOTE — Telephone Encounter (Signed)
Patient wants to know if having two periods in one month is normal.  She had her cycle two weeks again and has started again.

## 2017-07-20 ENCOUNTER — Ambulatory Visit (INDEPENDENT_AMBULATORY_CARE_PROVIDER_SITE_OTHER): Payer: BLUE CROSS/BLUE SHIELD | Admitting: Obstetrics and Gynecology

## 2017-07-20 ENCOUNTER — Other Ambulatory Visit: Payer: Self-pay

## 2017-07-20 ENCOUNTER — Encounter: Payer: Self-pay | Admitting: Obstetrics and Gynecology

## 2017-07-20 VITALS — BP 118/80 | HR 88 | Temp 98.5°F | Ht 64.0 in | Wt 301.0 lb

## 2017-07-20 DIAGNOSIS — Z3202 Encounter for pregnancy test, result negative: Secondary | ICD-10-CM

## 2017-07-20 DIAGNOSIS — N3001 Acute cystitis with hematuria: Secondary | ICD-10-CM | POA: Diagnosis not present

## 2017-07-20 DIAGNOSIS — R300 Dysuria: Secondary | ICD-10-CM | POA: Diagnosis not present

## 2017-07-20 DIAGNOSIS — N938 Other specified abnormal uterine and vaginal bleeding: Secondary | ICD-10-CM

## 2017-07-20 LAB — POCT URINALYSIS DIPSTICK
Appearance: NORMAL
Bilirubin, UA: NEGATIVE
Blood, UA: NEGATIVE
Glucose, UA: NEGATIVE
KETONES UA: NEGATIVE
NITRITE UA: NEGATIVE
Odor: NORMAL
PH UA: 5 (ref 5.0–8.0)
PROTEIN UA: NEGATIVE
SPEC GRAV UA: 1.02 (ref 1.010–1.025)
UROBILINOGEN UA: 0.2 U/dL

## 2017-07-20 LAB — POCT URINE PREGNANCY: Preg Test, Ur: NEGATIVE

## 2017-07-20 MED ORDER — NITROFURANTOIN MONOHYD MACRO 100 MG PO CAPS: 100.0000 mg | ORAL_CAPSULE | Freq: Two times a day (BID) | ORAL | 0 refills | Status: AC

## 2017-07-20 NOTE — Progress Notes (Signed)
Patient, No Pcp Per   Chief Complaint  Patient presents with  . Urinary Tract Infection    Urinary Frequency & Dysuria, nausea x 2 days blood when wipes.    HPI:      Ms. Paige Blanchard is a 22 y.o. G0P0000 who LMP was Patient's last menstrual period was 07/13/2017., presents today for dysuria, urinary frequency/urgency for the past 2 days.  Did notice blood with urination today but none in toilet/on underwear. No LBP, belly pain, fevers. No vag sx.  Pt with irreg bleeidng since stopping depo 12/18. Bled 6/24 and then again 7/9. Pt would like to conceive. Is sex active, no new partners. Neg STD test 4/19.  Neg UPT 5/19 at DUB eval. Discussed trying OCPs for a few months to regulate cycles as depo getting out of system. Pt not interested.    Past Medical History:  Diagnosis Date  . Dysmenorrhea   . Migraine with aura   . Vitamin D deficiency     Past Surgical History:  Procedure Laterality Date  . abcessed tooth  03/14/2017   drained in ED of [facility]  . HERNIA REPAIR     AGE 62  . TONSILLECTOMY AND ADENOIDECTOMY    . TUBES IN EAR    . WISDOM TOOTH EXTRACTION  01/2017    Family History  Problem Relation Age of Onset  . Diabetes Mother   . Hypertension Mother   . Heart disease Father   . Diabetes Maternal Grandmother        TYPE 2  . Diabetes Maternal Grandfather        TYPE 2  . Breast cancer Other   . Cancer Other        PROSTATE    Social History   Socioeconomic History  . Marital status: Single    Spouse name: Not on file  . Number of children: 0  . Years of education: Not on file  . Highest education level: Not on file  Occupational History  . Not on file  Social Needs  . Financial resource strain: Not on file  . Food insecurity:    Worry: Not on file    Inability: Not on file  . Transportation needs:    Medical: Not on file    Non-medical: Not on file  Tobacco Use  . Smoking status: Never Smoker  . Smokeless tobacco: Never Used  Substance and  Sexual Activity  . Alcohol use: No    Frequency: Never  . Drug use: No  . Sexual activity: Yes    Birth control/protection: None  Lifestyle  . Physical activity:    Days per week: Not on file    Minutes per session: Not on file  . Stress: Not on file  Relationships  . Social connections:    Talks on phone: Not on file    Gets together: Not on file    Attends religious service: Not on file    Active member of club or organization: Not on file    Attends meetings of clubs or organizations: Not on file    Relationship status: Not on file  . Intimate partner violence:    Fear of current or ex partner: Not on file    Emotionally abused: Not on file    Physically abused: Not on file    Forced sexual activity: Not on file  Other Topics Concern  . Not on file  Social History Narrative  . Not on file  Outpatient Medications Prior to Visit  Medication Sig Dispense Refill  . albuterol (PROVENTIL HFA) 108 (90 Base) MCG/ACT inhaler Inhale 2 puffs into the lungs as needed.    Marland Kitchen buPROPion (WELLBUTRIN XL) 150 MG 24 hr tablet Take by mouth.    . Cholecalciferol (VITAMIN D3) 1000 units CAPS Take 1 capsule by mouth daily.    Marland Kitchen topiramate (TOPAMAX) 100 MG tablet     . topiramate (TOPAMAX) 50 MG tablet Take 200 mg by mouth daily.     No facility-administered medications prior to visit.       ROS:  Review of Systems  Constitutional: Negative for fever.  Gastrointestinal: Negative for blood in stool, constipation, diarrhea, nausea and vomiting.  Genitourinary: Positive for dysuria, frequency, urgency, vaginal bleeding and vaginal discharge. Negative for dyspareunia, flank pain, hematuria and vaginal pain.  Musculoskeletal: Negative for back pain.  Skin: Negative for rash.    OBJECTIVE:   Vitals:  BP 118/80 (BP Location: Left Arm, Patient Position: Sitting, Cuff Size: Large)   Pulse 88   Temp 98.5 F (36.9 C)   Ht 5\' 4"  (1.626 m)   Wt (!) 301 lb (136.5 kg)   LMP 07/13/2017  Comment: 2 periods in past month/BTB  BMI 51.67 kg/m   Physical Exam  Constitutional: She is oriented to person, place, and time. Vital signs are normal. She appears well-developed.  Neck: Normal range of motion.  Pulmonary/Chest: Effort normal.  Abdominal: There is no CVA tenderness.  Genitourinary: Vagina normal and uterus normal. There is no rash, tenderness or lesion on the right labia. There is no rash, tenderness or lesion on the left labia. Uterus is not enlarged and not tender. Cervix exhibits no motion tenderness. Right adnexum displays no mass and no tenderness. Left adnexum displays no mass and no tenderness. No erythema, tenderness or bleeding in the vagina. No vaginal discharge found.  Genitourinary Comments: No vaginal bleeding.   Musculoskeletal: Normal range of motion.  Neurological: She is alert and oriented to person, place, and time. No cranial nerve deficit.  Psychiatric: She has a normal mood and affect. Her behavior is normal. Judgment and thought content normal.  Vitals reviewed.   Results: Results for orders placed or performed in visit on 07/20/17 (from the past 24 hour(s))  POCT Urinalysis Dipstick     Status: Abnormal   Collection Time: 07/20/17  4:04 PM  Result Value Ref Range   Color, UA yeloow    Clarity, UA clear    Glucose, UA Negative Negative   Bilirubin, UA Neg    Ketones, UA neg    Spec Grav, UA 1.020 1.010 - 1.025   Blood, UA Neg    pH, UA 5.0 5.0 - 8.0   Protein, UA Negative Negative   Urobilinogen, UA 0.2 0.2 or 1.0 E.U./dL   Nitrite, UA neg    Leukocytes, UA Trace (A) Negative   Appearance normal    Odor normal   POCT urine pregnancy     Status: Normal   Collection Time: 07/20/17  4:09 PM  Result Value Ref Range   Preg Test, Ur Negative Negative     Assessment/Plan: Acute cystitis with hematuria - Dip with small leuks, pos sx. Treat wtih Rx macrobid. Check C&S. F/u prn.  - Plan: nitrofurantoin, macrocrystal-monohydrate, (MACROBID)  100 MG capsule  Dysuria - Plan: POCT Urinalysis Dipstick, Urine Culture  DUB (dysfunctional uterine bleeding) - Neg UPT. Discussed OCPS short term prn cycle control off depo. Pt wants to follow expectantly  -  Plan: POCT urine pregnancy    Meds ordered this encounter  Medications  . nitrofurantoin, macrocrystal-monohydrate, (MACROBID) 100 MG capsule    Sig: Take 1 capsule (100 mg total) by mouth 2 (two) times daily for 5 days.    Dispense:  10 capsule    Refill:  0    Order Specific Question:   Supervising Provider    Answer:   Nadara Mustard [[id number]]      Return if symptoms worsen or fail to improve.  Alicia B. Copland, PA-C 07/20/2017 4:09 PM

## 2017-07-20 NOTE — Patient Instructions (Signed)
I value your feedback and entrusting us with your care. If you get a [facility] patient survey, I would appreciate you taking the time to let us know about your experience today. Thank you! 

## 2017-07-22 LAB — URINE CULTURE

## 2017-09-23 ENCOUNTER — Ambulatory Visit: Payer: BLUE CROSS/BLUE SHIELD | Admitting: Obstetrics and Gynecology

## 2018-01-05 NOTE — L&D Delivery Note (Signed)
Delivery Note At 8:12 AM a viable and healthy female was delivered via  (Presentation: Right occiput; anterior ).  APGAR: 8, 9; weight pending .   Placenta status: spontaneous, intact .  Cord: 3V with loose nuchal x 1 reduced on perineum  Anesthesia:  Epidural  Episiotomy:  None Lacerations:  Hymenal Suture Repair: 2.0 vicryl rapide Est. Blood Loss (mL):  537  Mom to postpartum.  Baby to Couplet care / Skin to Skin.  Vanessa Kick 09/01/2018, 8:29 AM

## 2018-01-13 ENCOUNTER — Telehealth: Payer: Self-pay

## 2018-01-13 NOTE — Telephone Encounter (Signed)
Pt has had 3 positive preg tests; is 6d late.  Has been cramping like period cramps.  Is this normal?  [phone]  Adv cramping is normal through out the whole pregnancy as long as it doesn't double her over or stop her in her tracks.  Pt worried about miscarriage b/c of the cramping.  She has not seen blood at all.  Pt reassured.

## 2018-01-18 ENCOUNTER — Emergency Department (HOSPITAL_COMMUNITY): Payer: BLUE CROSS/BLUE SHIELD

## 2018-01-18 ENCOUNTER — Encounter (HOSPITAL_COMMUNITY): Payer: Self-pay | Admitting: Emergency Medicine

## 2018-01-18 ENCOUNTER — Emergency Department (HOSPITAL_COMMUNITY)
Admission: EM | Admit: 2018-01-18 | Discharge: 2018-01-18 | Disposition: A | Discharge status: Home / Self Care | Payer: BLUE CROSS/BLUE SHIELD | Attending: Emergency Medicine | Admitting: Emergency Medicine

## 2018-01-18 ENCOUNTER — Other Ambulatory Visit: Payer: Self-pay

## 2018-01-18 DIAGNOSIS — O26859 Spotting complicating pregnancy, unspecified trimester: Secondary | ICD-10-CM | POA: Diagnosis not present

## 2018-01-18 DIAGNOSIS — O4690 Antepartum hemorrhage, unspecified, unspecified trimester: Secondary | ICD-10-CM

## 2018-01-18 DIAGNOSIS — Z3A00 Weeks of gestation of pregnancy not specified: Secondary | ICD-10-CM | POA: Diagnosis not present

## 2018-01-18 LAB — WET PREP, GENITAL
CLUE CELLS WET PREP: NONE SEEN
Sperm: NONE SEEN
Trich, Wet Prep: NONE SEEN
WBC WET PREP: NONE SEEN
Yeast Wet Prep HPF POC: NONE SEEN

## 2018-01-18 LAB — ABO/RH: ABO/RH(D): A POS

## 2018-01-18 LAB — HCG, QUANTITATIVE, PREGNANCY: hCG, Beta Chain, Quant, S: 1688 m[IU]/mL — ABNORMAL HIGH (ref <5)

## 2018-01-18 NOTE — ED Provider Notes (Signed)
[facility] Provider Note   CSN: [id number] Arrival date & time: 01/18/18  1712     History   Chief Complaint Chief Complaint  Patient presents with  . Vaginal Bleeding    HPI Paige Blanchard is a 23 y.o. female.  The history is provided by the patient. No language interpreter was used.  Possible Pregnancy  This is a new problem. The current episode started more than 1 week ago. The problem occurs constantly. Pertinent negatives include no abdominal pain. Nothing aggravates the symptoms. Nothing relieves the symptoms. She has tried nothing for the symptoms. The treatment provided no relief.  Pt complains of spotting and early pregnancy. Pt reports she had a positive test.  Pt noticed a small amount of blood   Past Medical History:  Diagnosis Date  . Dysmenorrhea   . Migraine with aura   . Vitamin D deficiency     Patient Active Problem List   Diagnosis Date Noted  . Mastodynia 05/26/2017  . Recurrent major depressive disorder (HCC) 10/26/2014  . Class 3 severe obesity due to excess calories without serious comorbidity with body mass index (BMI) of 50.0 to 59.9 in adult (HCC) 11/14/2013  . Migraine 12/17/2011    Past Surgical History:  Procedure Laterality Date  . abcessed tooth  03/14/2017   drained in ED of [facility]  . HERNIA REPAIR     AGE 61  . TONSILLECTOMY AND ADENOIDECTOMY    . TUBES IN EAR    . WISDOM TOOTH EXTRACTION  01/2017     OB History    Gravida  1   Para  0   Term  0   Preterm  0   AB  0   Living  0     SAB  0   TAB  0   Ectopic  0   Multiple  0   Live Births  0            Home Medications    Prior to Admission medications   Medication Sig Start Date End Date Taking? Authorizing Provider  albuterol (PROVENTIL HFA) 108 (90 Base) MCG/ACT inhaler Inhale 2 puffs into the lungs as needed. 03/12/15   [provider]  buPROPion (WELLBUTRIN XL) 150 MG 24 hr tablet Take by mouth.  05/21/17 06/20/17  [provider]  topiramate (TOPAMAX) 100 MG tablet  05/21/17   [provider]  topiramate (TOPAMAX) 50 MG tablet Take 200 mg by mouth daily. 03/17/17 03/17/18  [provider]    Family History Family History  Problem Relation Age of Onset  . Diabetes Mother   . Hypertension Mother   . Heart disease Father   . Diabetes Maternal Grandmother        TYPE 2  . Diabetes Maternal Grandfather        TYPE 2  . Breast cancer Other   . Cancer Other        PROSTATE    Social History Social History   Tobacco Use  . Smoking status: Never Smoker  . Smokeless tobacco: Never Used  Substance Use Topics  . Alcohol use: No    Frequency: Never  . Drug use: No     Allergies   Peanut oil   Review of Systems Review of Systems  Gastrointestinal: Negative for abdominal pain.  Genitourinary: Negative for pelvic pain.  All other systems reviewed and are negative.    Physical Exam Updated Vital Signs BP (!) 142/85 (  BP Location: Right Arm)   Pulse (!) 108   Temp 98.6 F (37 C) (Oral)   Resp 16   Ht 5\' 4"  (1.626 m)   Wt 136.1 kg   LMP 07/13/2017 Comment: 2 periods in past month/BTB  SpO2 99%   BMI 51.49 kg/m   Physical Exam Vitals signs and nursing note reviewed.  Constitutional:      Appearance: She is well-developed.  HENT:     Head: Normocephalic.     Right Ear: Tympanic membrane normal.     Left Ear: Tympanic membrane normal.     Nose: Nose normal.  Eyes:     Pupils: Pupils are equal, round, and reactive to light.  Neck:     Musculoskeletal: Normal range of motion.  Cardiovascular:     Rate and Rhythm: Tachycardia present.  Pulmonary:     Effort: Pulmonary effort is normal.  Abdominal:     General: Abdomen is flat. There is no distension.  Genitourinary:    Comments: Small amount of dark dried blood  Musculoskeletal: Normal range of motion.  Skin:    General: Skin is warm.  Neurological:     General: No focal deficit  present.     Mental Status: She is alert and oriented to person, place, and time.  Psychiatric:        Mood and Affect: Mood normal.      ED Treatments / Results  Labs (all labs ordered are listed, but only abnormal results are displayed) Labs Reviewed  WET PREP, GENITAL  HCG, QUANTITATIVE, PREGNANCY  ABO/RH  GC/CHLAMYDIA PROBE AMP ([place]) NOT AT [facility]    EKG None  Radiology No results found.  Procedures Procedures (including critical care time)  Medications Ordered in ED Medications - No data to display   Initial Impression / Assessment and Plan / ED Course  I have reviewed the triage vital signs and the nursing notes.  Pertinent labs & imaging results that were available during my care of the patient were reviewed by me and considered in my medical decision making (see chart for details).     Ultrasound shows early gest sac. [facility]   Final Clinical Impressions(s) / ED Diagnoses   Final diagnoses:  Vaginal bleeding in pregnancy    ED Discharge Orders    None    An After Visit Summary was printed and given to the patient.    Elson AreasSofia, Valori Hollenkamp K, Cordelia Poche-C 01/18/18 2308    Wynetta FinesMessick, Peter C, MD 01/19/18 1059

## 2018-01-18 NOTE — ED Notes (Signed)
Patient verbalizes understanding of discharge instructions. Opportunity for questioning and answers were provided. Armband removed by staff, pt discharged from ED by Kathlene November, RN.

## 2018-01-18 NOTE — ED Triage Notes (Signed)
Pt presents to ED for evaluation of vaginal bleeding and abdominal cramping. Pt [redacted]w[redacted]d pregnant and presents for evaluation of vaginal bleeding onset today and abdominal cramping that began 6 days ago.

## 2018-01-18 NOTE — Discharge Instructions (Signed)
Follow up at [facility] for recheck and repeat ultrasound in 2 weeks

## 2018-01-18 NOTE — ED Notes (Signed)
Patient transported to Ultrasound 

## 2018-01-19 ENCOUNTER — Telehealth: Payer: Self-pay

## 2018-01-19 LAB — GC/CHLAMYDIA PROBE AMP (~~LOC~~) NOT AT ARMC
CHLAMYDIA, DNA PROBE: NEGATIVE
Neisseria Gonorrhea: NEGATIVE

## 2018-01-19 NOTE — Telephone Encounter (Signed)
Pt calling; is early pregnant; brown d/c; still cramping; is it okay to take cold med?  [phone]  Adv brown means it's old, cramping normal as long as it doesn't double her over or stop her in her tracks.  May take plain robitussin, plain sudafed, Hall's cough drops; e.s. tylenol; warm salt water gargles; sip hot beverages/hot soup; push fluids and rest.  Pt has appt on the 21st.  Adv it worsens to be seen sooner.

## 2018-01-26 ENCOUNTER — Encounter

## 2018-01-26 ENCOUNTER — Ambulatory Visit (INDEPENDENT_AMBULATORY_CARE_PROVIDER_SITE_OTHER): Payer: BLUE CROSS/BLUE SHIELD | Admitting: Maternal Newborn

## 2018-01-26 ENCOUNTER — Encounter: Payer: Self-pay | Admitting: Maternal Newborn

## 2018-01-26 VITALS — BP 124/74

## 2018-01-26 DIAGNOSIS — O0991 Supervision of high risk pregnancy, unspecified, first trimester: Secondary | ICD-10-CM

## 2018-01-26 DIAGNOSIS — O99211 Obesity complicating pregnancy, first trimester: Secondary | ICD-10-CM

## 2018-01-26 DIAGNOSIS — Z369 Encounter for antenatal screening, unspecified: Secondary | ICD-10-CM

## 2018-01-26 DIAGNOSIS — Z3A01 Less than 8 weeks gestation of pregnancy: Secondary | ICD-10-CM

## 2018-01-26 DIAGNOSIS — Z6841 Body Mass Index (BMI) 40.0 and over, adult: Secondary | ICD-10-CM

## 2018-01-26 DIAGNOSIS — O0990 Supervision of high risk pregnancy, unspecified, unspecified trimester: Secondary | ICD-10-CM

## 2018-01-26 NOTE — Progress Notes (Signed)
01/26/2018   Chief Complaint: Amenorrhea, positive home pregnancy test, desires prenatal care.  Transfer of Care Patient: no  History of Present Illness: Paige Blanchard is a 23 y.o. G2P0010 at [redacted]w[redacted]d based on Patient's last menstrual period on 12/15/2017, with an Estimated Date of Delivery: 09/21/2018, with the above CC.   Her periods were: irregular periods  She has Positive signs or symptoms of nausea/vomiting of pregnancy. She has Negative signs or symptoms of miscarriage or preterm labor She identifies Negative Zika risk factors for her and her partner On any different medications around the time she conceived/early pregnancy: Yes , albuterol, Topamax  History of varicella: No   Review of Systems  Constitutional: Negative.   HENT: Negative.   Eyes: Negative.   Respiratory: Negative for shortness of breath and wheezing.   Cardiovascular: Negative for chest pain and palpitations.  Gastrointestinal: Positive for diarrhea and nausea.  Genitourinary: Positive for frequency. Negative for dysuria.  Musculoskeletal: Negative.   Skin: Negative.   Neurological: Positive for headaches.  Endo/Heme/Allergies: Negative.   Psychiatric/Behavioral: Negative.   Breasts: positive for breast tenderness Review of systems was otherwise negative, except as stated in the above HPI.  OBGYN History: As per HPI. OB History  Gravida Para Term Preterm AB Living  2 0 0 0 1 0  SAB TAB Ectopic Multiple Live Births  1 0 0 0 0    # Outcome Date GA Lbr Len/2nd Weight Sex Delivery Anes PTL Lv  2 Current           1 SAB             Any issues with any prior pregnancies: yes, SAB G1 Any prior children are healthy, doing well, without any problems or issues: not applicable History of pap smears: Yes. Last pap smear 04/22/2017. Abnormal: yes, LSIL History of STIs: No   Past Medical History: Past Medical History:  Diagnosis Date  . Dysmenorrhea   . Migraine with aura   . Vitamin D deficiency     Past  Surgical History: Past Surgical History:  Procedure Laterality Date  . abcessed tooth  03/14/2017   drained in ED of [facility]  . HERNIA REPAIR     AGE 23  . TONSILLECTOMY AND ADENOIDECTOMY    . TUBES IN EAR    . WISDOM TOOTH EXTRACTION  01/2017    Family History:  Family History  Problem Relation Age of Onset  . Diabetes Mother   . Hypertension Mother   . Heart disease Father   . Diabetes Maternal Grandmother        TYPE 2  . Diabetes Maternal Grandfather        TYPE 2  . Breast cancer Other   . Cancer Other        PROSTATE   She denies any female cancers, bleeding or blood clotting disorders.  She denies any history of intellectual disability, birth defects or genetic disorders in her or the FOB's history  Social History:  Social History   Socioeconomic History  . Marital status: Single    Spouse name: Not on file  . Number of children: 0  . Years of education: Not on file  . Highest education level: Not on file  Occupational History  . Not on file  Social Needs  . Financial resource strain: Not on file  . Food insecurity:    Worry: Not on file    Inability: Not on file  . Transportation needs:    Medical: Not  on file    Non-medical: Not on file  Tobacco Use  . Smoking status: Never Smoker  . Smokeless tobacco: Never Used  Substance and Sexual Activity  . Alcohol use: No    Frequency: Never  . Drug use: No  . Sexual activity: Yes    Birth control/protection: None  Lifestyle  . Physical activity:    Days per week: Not on file    Minutes per session: Not on file  . Stress: Not on file  Relationships  . Social connections:    Talks on phone: Not on file    Gets together: Not on file    Attends religious service: Not on file    Active member of club or organization: Not on file    Attends meetings of clubs or organizations: Not on file    Relationship status: Not on file  . Intimate partner violence:    Fear of current or ex partner: Not on file     Emotionally abused: Not on file    Physically abused: Not on file    Forced sexual activity: Not on file  Other Topics Concern  . Not on file  Social History Narrative  . Not on file   Any cats in the household: no Domestic violence screening was negative.  Allergy: Allergies  Allergen Reactions  . Peanut Oil Hives    Current Outpatient Medications:  Current Outpatient Medications:  .  albuterol (PROVENTIL HFA) 108 (90 Base) MCG/ACT inhaler, Inhale 2 puffs into the lungs as needed., Disp: , Rfl:  .  buPROPion (WELLBUTRIN XL) 150 MG 24 hr tablet, Take by mouth., Disp: , Rfl:  .  topiramate (TOPAMAX) 100 MG tablet, , Disp: , Rfl:  .  topiramate (TOPAMAX) 50 MG tablet, Take 200 mg by mouth daily., Disp: , Rfl:    Physical Exam:   BP 124/74   LMP 12/15/2017 Comment: 2 periods in past month/BTB . Constitutional: Well nourished, well developed female in no acute distress.  Neck:  Supple, normal appearance, and no thyromegaly  Cardiovascular: S1, S2 normal, no murmur, rub or gallop, regular rate and rhythm Respiratory:  Clear to auscultation bilaterally. Normal respiratory effort Abdomen: No masses, hernias; diffusely non tender to palpation, non distended Breasts: breasts appear normal, no suspicious masses, no skin or nipple changes or axillary nodes. Neuro/Psych:  Normal mood and affect.  Skin:  Warm and dry.  Lymphatic:  No inguinal lymphadenopathy.   Pelvic exam: External genitalia, Bartholin's glands, Urethra, Skene's glands: within normal limits Vagina: within normal limits and with no blood in the vault  Cervix: deferred after shared decision-making Uterus:  normal contour Adnexa:  no mass, fullness, tenderness  Assessment: Paige Blanchard is a 23 y.o. G2P0010 at [redacted]w[redacted]d based on Patient's last menstrual period on 12/15/2017, with an Estimated Date of Delivery: 09/21/2018, presenting for prenatal care.  Plan:  1) Avoid alcoholic beverages. 2) Patient encouraged not to  smoke.  3) Discontinue the use of all non-medicinal drugs and chemicals.  4) Take prenatal vitamins daily.  5) Seatbelt use advised 6) Nutrition, food safety (fish, cheese advisories, and high nitrite foods) and exercise discussed. 7) Hospital and practice style delivering at [facility] discussed  8) Patient is asked about travel to areas at risk for the Zika virus, and counseled to avoid travel and exposure to people who may have themselves been exposed to the virus. Testing is discussed, and will be ordered as appropriate.  9) [facility]birth classes at [facility] advised 10) Genetic Screening,  such as with 1st Trimester Screening, cell free fetal DNA, AFP testing, and Ultrasound, as well as with amniocentesis and CVS as appropriate, is discussed with patient. She plans to have genetic testing this pregnancy. 11) GTT and NOB labs ordered, will return for these. 12) Dating/viability scan ordered. She had some spotting on 1/15 and was seen in the ED. Has not been spotting since then. Ultrasound showed an early gestational sac at that time.  Problem list reviewed and updated.  Marcelyn BruinsJacelyn Kamla Skilton, CNM [facility], [facility] 01/26/2018

## 2018-01-26 NOTE — Progress Notes (Signed)
NOB today. LMP 12/15/2017

## 2018-01-27 LAB — URINE DRUG PANEL 7
AMPHETAMINES, URINE: NEGATIVE ng/mL
BARBITURATE QUANT UR: NEGATIVE ng/mL
BENZODIAZEPINE QUANT UR: NEGATIVE ng/mL
Cannabinoid Quant, Ur: NEGATIVE ng/mL
Cocaine (Metab.): NEGATIVE ng/mL
OPIATE QUANT UR: NEGATIVE ng/mL
PCP Quant, Ur: NEGATIVE ng/mL

## 2018-01-28 LAB — URINE CULTURE

## 2018-02-02 ENCOUNTER — Ambulatory Visit (INDEPENDENT_AMBULATORY_CARE_PROVIDER_SITE_OTHER): Payer: BLUE CROSS/BLUE SHIELD | Admitting: Advanced Practice Midwife

## 2018-02-02 ENCOUNTER — Ambulatory Visit (INDEPENDENT_AMBULATORY_CARE_PROVIDER_SITE_OTHER): Payer: BLUE CROSS/BLUE SHIELD

## 2018-02-02 ENCOUNTER — Encounter: Payer: Self-pay | Admitting: Advanced Practice Midwife

## 2018-02-02 VITALS — BP 128/84 | Wt 304.0 lb

## 2018-02-02 DIAGNOSIS — O0990 Supervision of high risk pregnancy, unspecified, unspecified trimester: Secondary | ICD-10-CM

## 2018-02-02 DIAGNOSIS — O99210 Obesity complicating pregnancy, unspecified trimester: Secondary | ICD-10-CM

## 2018-02-02 DIAGNOSIS — Z369 Encounter for antenatal screening, unspecified: Secondary | ICD-10-CM

## 2018-02-02 DIAGNOSIS — Z131 Encounter for screening for diabetes mellitus: Secondary | ICD-10-CM

## 2018-02-02 DIAGNOSIS — Z3687 Encounter for antenatal screening for uncertain dates: Secondary | ICD-10-CM | POA: Diagnosis not present

## 2018-02-02 DIAGNOSIS — O99211 Obesity complicating pregnancy, first trimester: Secondary | ICD-10-CM

## 2018-02-02 DIAGNOSIS — Z3A01 Less than 8 weeks gestation of pregnancy: Secondary | ICD-10-CM

## 2018-02-02 NOTE — Progress Notes (Signed)
No vb. No lof. U/s today.  

## 2018-02-02 NOTE — Progress Notes (Signed)
  Routine Prenatal Care Visit  Subjective  Paige Blanchard is a 23 y.o. G2P0010 at [redacted]w[redacted]d being seen today for ongoing prenatal care.  She is currently monitored for the following issues for this high-risk pregnancy and has Migraine; Class 3 severe obesity due to excess calories without serious comorbidity with body mass index (BMI) of 50.0 to 59.9 in adult [facility]); Recurrent major depressive disorder (HCC); Mastodynia; Peanut allergy; LGSIL on Pap smear of cervix; and Supervision of high risk pregnancy, antepartum on their problem list.  ----------------------------------------------------------------------------------- Patient reports no complaints.    . Vag. Bleeding: None.   . Denies leaking of fluid.  ----------------------------------------------------------------------------------- The following portions of the patient's history were reviewed and updated as appropriate: allergies, current medications, past family history, past medical history, past social history, past surgical history and problem list. Problem list updated.   Objective  Blood pressure 128/84, weight (!) 304 lb (137.9 kg), last menstrual period 12/15/2017. Pregravid weight 291 lb (132 kg) Total Weight Gain 13 lb (5.897 kg) Urinalysis: Urine Protein    Urine Glucose    Fetal Status: Fetal Heart Rate (bpm): 123         Dating scan today: [redacted]w[redacted]d with EDD of 09/23/2018, no adjustment of EDD  General:  Alert, oriented and cooperative. Patient is in no acute distress.  Skin: Skin is warm and dry. No rash noted.   Cardiovascular: Normal heart rate noted  Respiratory: Normal respiratory effort, no problems with respiration noted  Abdomen: Soft, gravid, appropriate for gestational age.       Pelvic:  Cervical exam deferred        Extremities: Normal range of motion.     Mental Status: Normal mood and affect. Normal behavior. Normal judgment and thought content.   Assessment   23 y.o. G2P0010 at [redacted]w[redacted]d by  09/21/2018, by Last  Menstrual Period presenting for routine prenatal visit  Plan   pregnancy Problems (from 01/26/18 to present)    No problems associated with this episode.       Preterm labor symptoms and general obstetric precautions including but not limited to vaginal bleeding, contractions, leaking of fluid and fetal movement were reviewed in detail with the patient. Please refer to After Visit Summary for other counseling recommendations.   Return in about 3 weeks (around 02/23/2018) for early 1 hour gtt and other labs and rob.  Tresea Mall, CNM 02/02/2018 3:18 PM

## 2018-02-02 NOTE — Patient Instructions (Signed)
First Trimester of Pregnancy  The first trimester of pregnancy is from week 1 until the end of week 13 (months 1 through 3). A week after a sperm fertilizes an egg, the egg will implant on the wall of the uterus. This embryo will begin to develop into a baby. Genes from you and your partner will form the baby. The female genes will determine whether the baby will be a boy or a girl. At 6-8 weeks, the eyes and face will be formed, and the heartbeat can be seen on ultrasound. At the end of 12 weeks, all the baby's organs will be formed.  Now that you are pregnant, you will want to do everything you can to have a healthy baby. Two of the most important things are to get good prenatal care and to follow your health care provider's instructions. Prenatal care is all the medical care you receive before the baby's birth. This care will help prevent, find, and treat any problems during the pregnancy and childbirth.  Body changes during your first trimester  Your body goes through many changes during pregnancy. The changes vary from woman to woman.   You may gain or lose a couple of pounds at first.   You may feel sick to your stomach (nauseous) and you may throw up (vomit). If the vomiting is uncontrollable, call your health care provider.   You may tire easily.   You may develop headaches that can be relieved by medicines. All medicines should be approved by your health care provider.   You may urinate more often. Painful urination may mean you have a bladder infection.   You may develop heartburn as a result of your pregnancy.   You may develop constipation because certain hormones are causing the muscles that push stool through your intestines to slow down.   You may develop hemorrhoids or swollen veins (varicose veins).   Your breasts may begin to grow larger and become tender. Your nipples may stick out more, and the tissue that surrounds them (areola) may become darker.   Your gums may bleed and may be  sensitive to brushing and flossing.   Dark spots or blotches (chloasma, mask of pregnancy) may develop on your face. This will likely fade after the baby is born.   Your menstrual periods will stop.   You may have a loss of appetite.   You may develop cravings for certain kinds of food.   You may have changes in your emotions from day to day, such as being excited to be pregnant or being concerned that something may go wrong with the pregnancy and baby.   You may have more vivid and strange dreams.   You may have changes in your hair. These can include thickening of your hair, rapid growth, and changes in texture. Some women also have hair loss during or after pregnancy, or hair that feels dry or thin. Your hair will most likely return to normal after your baby is born.  What to expect at prenatal visits  During a routine prenatal visit:   You will be weighed to make sure you and the baby are growing normally.   Your blood pressure will be taken.   Your abdomen will be measured to track your baby's growth.   The fetal heartbeat will be listened to between weeks 10 and 14 of your pregnancy.   Test results from any previous visits will be discussed.  Your health care provider may ask you:     How you are feeling.   If you are feeling the baby move.   If you have had any abnormal symptoms, such as leaking fluid, bleeding, severe headaches, or abdominal cramping.   If you are using any tobacco products, including cigarettes, chewing tobacco, and electronic cigarettes.   If you have any questions.  Other tests that may be performed during your first trimester include:   Blood tests to find your blood type and to check for the presence of any previous infections. The tests will also be used to check for low iron levels (anemia) and protein on red blood cells (Rh antibodies). Depending on your risk factors, or if you previously had diabetes during pregnancy, you may have tests to check for high blood sugar  that affects pregnant women (gestational diabetes).   Urine tests to check for infections, diabetes, or protein in the urine.   An ultrasound to confirm the proper growth and development of the baby.   Fetal screens for spinal cord problems (spina bifida) and Down syndrome.   HIV (human immunodeficiency virus) testing. Routine prenatal testing includes screening for HIV, unless you choose not to have this test.   You may need other tests to make sure you and the baby are doing well.  Follow these instructions at home:  Medicines   Follow your health care provider's instructions regarding medicine use. Specific medicines may be either safe or unsafe to take during pregnancy.   Take a prenatal vitamin that contains at least 600 micrograms (mcg) of folic acid.   If you develop constipation, try taking a stool softener if your health care provider approves.  Eating and drinking     Eat a balanced diet that includes fresh fruits and vegetables, whole grains, good sources of protein such as meat, eggs, or tofu, and low-fat dairy. Your health care provider will help you determine the amount of weight gain that is right for you.   Avoid raw meat and uncooked cheese. These carry germs that can cause birth defects in the baby.   Eating four or five small meals rather than three large meals a day may help relieve nausea and vomiting. If you start to feel nauseous, eating a few soda crackers can be helpful. Drinking liquids between meals, instead of during meals, also seems to help ease nausea and vomiting.   Limit foods that are high in fat and processed sugars, such as fried and sweet foods.   To prevent constipation:  ? Eat foods that are high in fiber, such as fresh fruits and vegetables, whole grains, and beans.  ? Drink enough fluid to keep your urine clear or pale yellow.  Activity   Exercise only as directed by your health care provider. Most women can continue their usual exercise routine during  pregnancy. Try to exercise for 30 minutes at least 5 days a week. Exercising will help you:  ? Control your weight.  ? Stay in shape.  ? Be prepared for labor and delivery.   Experiencing pain or cramping in the lower abdomen or lower back is a good sign that you should stop exercising. Check with your health care provider before continuing with normal exercises.   Try to avoid standing for long periods of time. Move your legs often if you must stand in one place for a long time.   Avoid heavy lifting.   Wear low-heeled shoes and practice good posture.   You may continue to have sex unless your health care   provider tells you not to.  Relieving pain and discomfort   Wear a good support bra to relieve breast tenderness.   Take warm sitz baths to soothe any pain or discomfort caused by hemorrhoids. Use hemorrhoid cream if your health care provider approves.   Rest with your legs elevated if you have leg cramps or low back pain.   If you develop varicose veins in your legs, wear support hose. Elevate your feet for 15 minutes, 3-4 times a day. Limit salt in your diet.  Prenatal care   Schedule your prenatal visits by the twelfth week of pregnancy. They are usually scheduled monthly at first, then more often in the last 2 months before delivery.   Write down your questions. Take them to your prenatal visits.   Keep all your prenatal visits as told by your health care provider. This is important.  Safety   Wear your seat belt at all times when driving.   Make a list of emergency phone numbers, including numbers for family, friends, the hospital, and police and fire departments.  General instructions   Ask your health care provider for a referral to a local prenatal education class. Begin classes no later than the beginning of month 6 of your pregnancy.   Ask for help if you have counseling or nutritional needs during pregnancy. Your health care provider can offer advice or refer you to specialists for help  with various needs.   Do not use hot tubs, steam rooms, or saunas.   Do not douche or use tampons or scented sanitary pads.   Do not cross your legs for long periods of time.   Avoid cat litter boxes and soil used by cats. These carry germs that can cause birth defects in the baby and possibly loss of the fetus by miscarriage or stillbirth.   Avoid all smoking, herbs, alcohol, and medicines not prescribed by your health care provider. Chemicals in these products affect the formation and growth of the baby.   Do not use any products that contain nicotine or tobacco, such as cigarettes and e-cigarettes. If you need help quitting, ask your health care provider. You may receive counseling support and other resources to help you quit.   Schedule a dentist appointment. At home, brush your teeth with a soft toothbrush and be gentle when you floss.  Contact a health care provider if:   You have dizziness.   You have mild pelvic cramps, pelvic pressure, or nagging pain in the abdominal area.   You have persistent nausea, vomiting, or diarrhea.   You have a bad smelling vaginal discharge.   You have pain when you urinate.   You notice increased swelling in your face, hands, legs, or ankles.   You are exposed to fifth disease or chickenpox.   You are exposed to German measles (rubella) and have never had it.  Get help right away if:   You have a fever.   You are leaking fluid from your vagina.   You have spotting or bleeding from your vagina.   You have severe abdominal cramping or pain.   You have rapid weight gain or loss.   You vomit blood or material that looks like coffee grounds.   You develop a severe headache.   You have shortness of breath.   You have any kind of trauma, such as from a fall or a car accident.  Summary   The first trimester of pregnancy is from week 1 until   the end of week 13 (months 1 through 3).   Your body goes through many changes during pregnancy. The changes vary from  woman to woman.   You will have routine prenatal visits. During those visits, your health care provider will examine you, discuss any test results you may have, and talk with you about how you are feeling.  This information is not intended to replace advice given to you by your health care provider. Make sure you discuss any questions you have with your health care provider.  Document Released: 12/16/2000 Document Revised: 12/04/2015 Document Reviewed: 12/04/2015  Elsevier Interactive Patient Education  2019 [employer]

## 2018-02-05 ENCOUNTER — Inpatient Hospital Stay (HOSPITAL_COMMUNITY)
Admission: AD | Admit: 2018-02-05 | Discharge: 2018-02-05 | Disposition: A | Discharge status: Home / Self Care | Payer: BLUE CROSS/BLUE SHIELD | Source: Ambulatory Visit | Attending: Obstetrics and Gynecology | Admitting: Obstetrics and Gynecology

## 2018-02-05 ENCOUNTER — Encounter (HOSPITAL_COMMUNITY): Payer: Self-pay | Admitting: *Deleted

## 2018-02-05 ENCOUNTER — Inpatient Hospital Stay (HOSPITAL_COMMUNITY): Payer: BLUE CROSS/BLUE SHIELD

## 2018-02-05 DIAGNOSIS — Z3A01 Less than 8 weeks gestation of pregnancy: Secondary | ICD-10-CM

## 2018-02-05 DIAGNOSIS — N930 Postcoital and contact bleeding: Secondary | ICD-10-CM

## 2018-02-05 DIAGNOSIS — O208 Other hemorrhage in early pregnancy: Secondary | ICD-10-CM | POA: Diagnosis present

## 2018-02-05 LAB — URINALYSIS, MICROSCOPIC (REFLEX)

## 2018-02-05 LAB — URINALYSIS, ROUTINE W REFLEX MICROSCOPIC
Bilirubin Urine: NEGATIVE
GLUCOSE, UA: NEGATIVE mg/dL
KETONES UR: NEGATIVE mg/dL
LEUKOCYTES UA: NEGATIVE
Nitrite: NEGATIVE
PROTEIN: 30 mg/dL — AB
Specific Gravity, Urine: 1.02 (ref 1.005–1.030)
pH: 6 (ref 5.0–8.0)

## 2018-02-05 LAB — CBC
HCT: 37.1 % (ref 36.0–46.0)
Hemoglobin: 12.6 g/dL (ref 12.0–15.0)
MCH: 31.7 pg (ref 26.0–34.0)
MCHC: 34 g/dL (ref 30.0–36.0)
MCV: 93.2 fL (ref 80.0–100.0)
NRBC: 0 % (ref 0.0–0.2)
Platelets: 221 10*3/uL (ref 150–400)
RBC: 3.98 MIL/uL (ref 3.87–5.11)
RDW: 14 % (ref 11.5–15.5)
WBC: 6.5 10*3/uL (ref 4.0–10.5)

## 2018-02-05 NOTE — MAU Note (Signed)
Paige Blanchard is a 23 y.o. at [redacted]w[redacted]d here in MAU reporting:  +vaginal bleeding with clots States had her first ultrasound on Wednesday and was assured everything was fine at the time. Goes to [facility] but wants to transfer care to [facility]. Onset of complaint: 622pm prior to arrival +lower abdominal cramping. Intermittent. Has been ongoing and was told was normal Pain score: 8/10 Vitals:   02/05/18 1856  BP: 115/64  Pulse: (!) 104  Resp: 17  Temp: 98.3 F (36.8 C)  SpO2: 100%   Lab orders placed from triage: ua

## 2018-02-05 NOTE — Discharge Instructions (Signed)

## 2018-02-05 NOTE — MAU Provider Note (Signed)
History     CSN: [id number]  Arrival date and time: 02/05/18 1837    Chief Complaint  Patient presents with  . Vaginal Bleeding  . Abdominal Pain   HPI Paige Blanchard is a 23 y.o. G2P0010 at [redacted]w[redacted]d who presents with vaginal bleeding. She states she had intercourse last night and today noticed that she had bleeding when she wiped. She states she passed one clot. She denies any pain.  OB History    Gravida  2   Para  0   Term  0   Preterm  0   AB  1   Living  0     SAB  1   TAB  0   Ectopic  0   Multiple  0   Live Births  0           Past Medical History:  Diagnosis Date  . Dysmenorrhea   . Migraine with aura   . Vitamin D deficiency     Past Surgical History:  Procedure Laterality Date  . abcessed tooth  03/14/2017   drained in ED of [facility]  . HERNIA REPAIR     AGE 32  . TONSILLECTOMY AND ADENOIDECTOMY    . TUBES IN EAR    . WISDOM TOOTH EXTRACTION  01/2017    Family History  Problem Relation Age of Onset  . Diabetes Mother   . Hypertension Mother   . Heart disease Father   . Diabetes Maternal Grandmother        TYPE 2  . Diabetes Maternal Grandfather        TYPE 2  . Breast cancer Other   . Cancer Other        PROSTATE    Social History   Tobacco Use  . Smoking status: Never Smoker  . Smokeless tobacco: Never Used  Substance Use Topics  . Alcohol use: No    Frequency: Never  . Drug use: No    Allergies:  Allergies  Allergen Reactions  . Peanut Oil Hives    Medications Prior to Admission  Medication Sig Dispense Refill Last Dose  . albuterol (PROVENTIL HFA) 108 (90 Base) MCG/ACT inhaler Inhale 2 puffs into the lungs as needed.   Not Taking  . buPROPion (WELLBUTRIN XL) 150 MG 24 hr tablet Take by mouth.   Taking    Review of Systems  Constitutional: Negative.  Negative for fatigue and fever.  HENT: Negative.   Respiratory: Negative.  Negative for shortness of breath.   Cardiovascular: Negative.  Negative for chest pain.   Gastrointestinal: Negative.  Negative for abdominal pain, constipation, diarrhea, nausea and vomiting.  Genitourinary: Positive for vaginal bleeding. Negative for dysuria.  Neurological: Negative.  Negative for dizziness and headaches.   Physical Exam   Blood pressure 115/64, pulse (!) 104, temperature 98.3 F (36.8 C), temperature source Oral, resp. rate 17, weight (!) 137.9 kg, last menstrual period 12/15/2017, SpO2 100 %.  Physical Exam  Nursing note and vitals reviewed. Constitutional: She is oriented to person, place, and time. She appears well-developed and well-nourished. No distress.  HENT:  Head: Normocephalic.  Eyes: Pupils are equal, round, and reactive to light.  Cardiovascular: Normal rate, regular rhythm and normal heart sounds.  Respiratory: Effort normal and breath sounds normal. No respiratory distress.  GI: Soft. Bowel sounds are normal. She exhibits no distension. There is no abdominal tenderness.  Genitourinary:    Genitourinary Comments: Scant vaginal bleeding   Neurological: She is alert and  oriented to person, place, and time.  Skin: Skin is warm and dry.  Psychiatric: She has a normal mood and affect. Her behavior is normal. Judgment and thought content normal.    MAU Course  Procedures Results for orders placed or performed during the hospital encounter of 02/05/18 (from the past 24 hour(s))  CBC     Status: None   Collection Time: 02/05/18  7:22 PM  Result Value Ref Range   WBC 6.5 4.0 - 10.5 K/uL   RBC 3.98 3.87 - 5.11 MIL/uL   Hemoglobin 12.6 12.0 - 15.0 g/dL   HCT 19.4 17.4 - 08.1 %   MCV 93.2 80.0 - 100.0 fL   MCH 31.7 26.0 - 34.0 pg   MCHC 34.0 30.0 - 36.0 g/dL   RDW 44.8 18.5 - 63.1 %   Platelets 221 150 - 400 K/uL   nRBC 0.0 0.0 - 0.2 %  Urinalysis, Routine w reflex microscopic     Status: Abnormal   Collection Time: 02/05/18  7:27 PM  Result Value Ref Range   Color, Urine YELLOW YELLOW   APPearance CLEAR CLEAR   Specific Gravity, Urine  1.020 1.005 - 1.030   pH 6.0 5.0 - 8.0   Glucose, UA NEGATIVE NEGATIVE mg/dL   Hgb urine dipstick LARGE (A) NEGATIVE   Bilirubin Urine NEGATIVE NEGATIVE   Ketones, ur NEGATIVE NEGATIVE mg/dL   Protein, ur 30 (A) NEGATIVE mg/dL   Nitrite NEGATIVE NEGATIVE   Leukocytes, UA NEGATIVE NEGATIVE  Urinalysis, Microscopic (reflex)     Status: Abnormal   Collection Time: 02/05/18  7:27 PM  Result Value Ref Range   RBC / HPF 21-50 0 - 5 RBC/hpf   WBC, UA 0-5 0 - 5 WBC/hpf   Bacteria, UA FEW (A) NONE SEEN   Squamous Epithelial / LPF 0-5 0 - 5   US Ob Transvaginal  Result Date: 02/05/2018 CLINICAL DATA:  Cramping with vaginal bleeding. Positive pregnancy test. EXAM: TRANSVAGINAL OB ULTRASOUND TECHNIQUE: Transvaginal ultrasound was performed for complete evaluation of the gestation as well as the maternal uterus, adnexal regions, and pelvic cul-de-sac. COMPARISON:  01/18/2018 and office ultrasound 02/02/2018 FINDINGS: Intrauterine gestational sac: Single. Yolk sac:  visualized. Embryo:  Visualized. Cardiac Activity: Visualized. Heart Rate: 131 bpm CRL: 9.7 mm   7 w   0 d                  [place] EDC: 09/24/2018 Subchorionic hemorrhage:  Small Maternal uterus/adnexae: Maternal ovaries unremarkable with a tiny amount of simple appearing free fluid in the cul-de-sac. IMPRESSION: Single living intrauterine gestation at 7 week 0 day gestational age by crown-rump length. Small subchorionic hemorrhage. Electronically Signed   By: Kennith Center M.D.   On: 02/05/2018 20:25    MDM UA CBC US OB Transvaginal  Assessment and Plan   1. Postcoital bleeding   2. [redacted] weeks gestation of pregnancy    -Discharge home in stable condition -Vaginal bleeding precautions discussed -Patient advised to follow-up with [facility] as scheduled for prenatal care -Patient may return to MAU as needed or if her condition were to change or worsen  Rolm Bookbinder CNM 02/05/2018, 7:35 PM

## 2018-02-07 ENCOUNTER — Ambulatory Visit (INDEPENDENT_AMBULATORY_CARE_PROVIDER_SITE_OTHER): Payer: BLUE CROSS/BLUE SHIELD | Admitting: Certified Nurse Midwife

## 2018-02-07 ENCOUNTER — Other Ambulatory Visit (INDEPENDENT_AMBULATORY_CARE_PROVIDER_SITE_OTHER): Payer: BLUE CROSS/BLUE SHIELD

## 2018-02-07 ENCOUNTER — Telehealth: Payer: Self-pay

## 2018-02-07 ENCOUNTER — Other Ambulatory Visit: Payer: Self-pay | Admitting: Certified Nurse Midwife

## 2018-02-07 VITALS — BP 104/66 | Wt 303.0 lb

## 2018-02-07 DIAGNOSIS — O208 Other hemorrhage in early pregnancy: Secondary | ICD-10-CM | POA: Diagnosis not present

## 2018-02-07 DIAGNOSIS — O209 Hemorrhage in early pregnancy, unspecified: Secondary | ICD-10-CM

## 2018-02-07 DIAGNOSIS — Z3A01 Less than 8 weeks gestation of pregnancy: Secondary | ICD-10-CM

## 2018-02-07 DIAGNOSIS — O200 Threatened abortion: Secondary | ICD-10-CM

## 2018-02-07 NOTE — Telephone Encounter (Signed)
Pt was seen in ED Saturday for bleeding c clots; they said the baby was fine; pt is still bleeding c clots.  [phone]  Pt states today blood is purplish brown color, has had one clot, cramping that makes her stop in her tracks.  Tx'd to [facility] to schedule.

## 2018-02-07 NOTE — Progress Notes (Signed)
Pt is experiencing bright red heavy bleeding with clots & cramps since Saturday.

## 2018-02-07 NOTE — Progress Notes (Signed)
Obstetrics & Gynecology Office Visit   Chief Complaint:  Chief Complaint  Patient presents with  . Routine Prenatal Visit    Bright Red bleeding w/clots & cramping since Saturday.     History of Present Illness: EMAAN CHEONG is a 23 y.o. G2P0010 at [redacted]w[redacted]d by her LMP and confirmed with a 6wk5d ultrasound. She was seen 2 days ago at [facility] for vaginal bleeding following intercourse. Her bleeding 2 days ago was bright red with clots and cramping. An ultrasound at that time revealed a viable 7week embryo and a small SCH. She has continued to have bleeding, but it is brown in color. She is still passing nickel sized clots and cramping intermittently. She has A positive blood type.   Review of Systems:  ROS   Past Medical History:  Past Medical History:  Diagnosis Date  . Dysmenorrhea   . Migraine with aura   . Vitamin D deficiency     Past Surgical History:  Past Surgical History:  Procedure Laterality Date  . abcessed tooth  03/14/2017   drained in ED of [facility]  . HERNIA REPAIR     AGE 65  . TONSILLECTOMY AND ADENOIDECTOMY    . TUBES IN EAR    . WISDOM TOOTH EXTRACTION  01/2017    Gynecologic History: Patient's last menstrual period was 12/15/2017 (exact date).  Obstetric History: G2P0010  Family History:  Family History  Problem Relation Age of Onset  . Diabetes Mother   . Hypertension Mother   . Heart disease Father   . Diabetes Maternal Grandmother        TYPE 2  . Diabetes Maternal Grandfather        TYPE 2  . Breast cancer Other   . Cancer Other        PROSTATE    Social History:  Social History   Socioeconomic History  . Marital status: Single    Spouse name: Not on file  . Number of children: 0  . Years of education: Not on file  . Highest education level: Not on file  Occupational History  . Not on file  Social Needs  . Financial resource strain: Not on file  . Food insecurity:    Worry: Not on file    Inability: Not on file  .  Transportation needs:    Medical: Not on file    Non-medical: Not on file  Tobacco Use  . Smoking status: Never Smoker  . Smokeless tobacco: Never Used  Substance and Sexual Activity  . Alcohol use: No    Frequency: Never  . Drug use: No  . Sexual activity: Yes    Birth control/protection: None  Lifestyle  . Physical activity:    Days per week: Not on file    Minutes per session: Not on file  . Stress: Not on file  Relationships  . Social connections:    Talks on phone: Not on file    Gets together: Not on file    Attends religious service: Not on file    Active member of club or organization: Not on file    Attends meetings of clubs or organizations: Not on file    Relationship status: Not on file  . Intimate partner violence:    Fear of current or ex partner: Not on file    Emotionally abused: Not on file    Physically abused: Not on file    Forced sexual activity: Not on file  Other  Topics Concern  . Not on file  Social History Narrative  . Not on file    Allergies:  Allergies  Allergen Reactions  . Peanut Oil Hives    Medications: Prior to Admission medications   Medication Sig Start Date End Date Taking? Authorizing Provider  albuterol (PROVENTIL HFA) 108 (90 Base) MCG/ACT inhaler Inhale 2 puffs into the lungs as needed. 03/12/15  Yes [provider]  Prenatal Vit-Fe Fumarate-FA (MULTIVITAMIN-PRENATAL) 27-0.8 MG TABS tablet Take 1 tablet by mouth daily at 12 noon.   Yes [provider]           Physical Exam Vitals: BP 104/66   Wt (!) 303 lb (137.4 kg)   BMI 52.01 kg/m  General: BF in NAD  TVUS today:  Singleton intrauterine pregnancy is visualized with a CRL consistent with 8450w1d gestation, giving an (U/S) EDD of 09/25/2018. Marland Kitchen.  FHR: 140 BPM CRL measurement: 11.2 mm Yolk sac is visualized and appears normal and early anatomy is normal. Amnion: visualized and appears normal   -Subchorionic hemorrhage measures 1.0 x 0.6 x 0.7  cm.   Assessment: 23 y.o. G2P0010 with threatened abortion    Plan: SAB precautions A positive blood type- Rhogam not indicated Pelvic rest until no bleeding x 2 weeks ROB in 1 week or sooner for increased bleeding  Farrel Connersolleen Lissie Hinesley, CNM

## 2018-02-14 ENCOUNTER — Other Ambulatory Visit: Payer: Self-pay | Admitting: Certified Nurse Midwife

## 2018-02-14 ENCOUNTER — Ambulatory Visit (INDEPENDENT_AMBULATORY_CARE_PROVIDER_SITE_OTHER): Payer: BLUE CROSS/BLUE SHIELD | Admitting: Certified Nurse Midwife

## 2018-02-14 ENCOUNTER — Ambulatory Visit (INDEPENDENT_AMBULATORY_CARE_PROVIDER_SITE_OTHER): Payer: BLUE CROSS/BLUE SHIELD

## 2018-02-14 VITALS — BP 128/70 | Wt 302.0 lb

## 2018-02-14 DIAGNOSIS — Z3A08 8 weeks gestation of pregnancy: Secondary | ICD-10-CM

## 2018-02-14 DIAGNOSIS — O200 Threatened abortion: Secondary | ICD-10-CM

## 2018-02-14 DIAGNOSIS — O0990 Supervision of high risk pregnancy, unspecified, unspecified trimester: Secondary | ICD-10-CM

## 2018-02-14 DIAGNOSIS — O209 Hemorrhage in early pregnancy, unspecified: Secondary | ICD-10-CM

## 2018-02-14 LAB — POCT URINALYSIS DIPSTICK OB: Glucose, UA: NEGATIVE

## 2018-02-14 NOTE — Progress Notes (Signed)
ROB Bleeding when wipes

## 2018-02-17 NOTE — Progress Notes (Signed)
ROB at 8wk5d: continues to have bleeding and cramping intermittently Today having dark blood and stringy clots. Ultrasound today: viable fetus measuring 8wk4d with FCA 169 BPM ROB in 1 week

## 2018-02-21 ENCOUNTER — Inpatient Hospital Stay (HOSPITAL_COMMUNITY): Payer: BLUE CROSS/BLUE SHIELD

## 2018-02-21 ENCOUNTER — Telehealth: Payer: Self-pay

## 2018-02-21 ENCOUNTER — Encounter (HOSPITAL_COMMUNITY): Payer: Self-pay | Admitting: *Deleted

## 2018-02-21 ENCOUNTER — Inpatient Hospital Stay (HOSPITAL_COMMUNITY)
Admission: AD | Admit: 2018-02-21 | Discharge: 2018-02-21 | Disposition: A | Discharge status: Home / Self Care | Payer: BLUE CROSS/BLUE SHIELD | Attending: Obstetrics and Gynecology | Admitting: Obstetrics and Gynecology

## 2018-02-21 ENCOUNTER — Other Ambulatory Visit: Payer: Self-pay

## 2018-02-21 DIAGNOSIS — O4691 Antepartum hemorrhage, unspecified, first trimester: Secondary | ICD-10-CM

## 2018-02-21 DIAGNOSIS — R109 Unspecified abdominal pain: Secondary | ICD-10-CM | POA: Diagnosis not present

## 2018-02-21 DIAGNOSIS — O26891 Other specified pregnancy related conditions, first trimester: Secondary | ICD-10-CM | POA: Diagnosis not present

## 2018-02-21 DIAGNOSIS — R1031 Right lower quadrant pain: Secondary | ICD-10-CM | POA: Insufficient documentation

## 2018-02-21 DIAGNOSIS — Z3A09 9 weeks gestation of pregnancy: Secondary | ICD-10-CM | POA: Insufficient documentation

## 2018-02-21 DIAGNOSIS — O26899 Other specified pregnancy related conditions, unspecified trimester: Secondary | ICD-10-CM | POA: Diagnosis present

## 2018-02-21 DIAGNOSIS — O4690 Antepartum hemorrhage, unspecified, unspecified trimester: Secondary | ICD-10-CM

## 2018-02-21 DIAGNOSIS — O209 Hemorrhage in early pregnancy, unspecified: Secondary | ICD-10-CM | POA: Insufficient documentation

## 2018-02-21 LAB — WET PREP, GENITAL
Clue Cells Wet Prep HPF POC: NONE SEEN
Trich, Wet Prep: NONE SEEN
WBC, Wet Prep HPF POC: NONE SEEN
Yeast Wet Prep HPF POC: NONE SEEN

## 2018-02-21 NOTE — MAU Provider Note (Signed)
History     CSN: [id number]  Arrival date and time: 02/21/18 1606   First Provider Initiated Contact with Patient 02/21/18 1757      Chief Complaint  Patient presents with  . Vaginal Bleeding  . Abdominal Pain   HPI  Ms.  Paige Blanchard is a 23 y.o. year old G47P0010 female at [redacted]w[redacted]d weeks gestation who presents to MAU reporting sharp RLQ pain after SI (3-4 hours prior to coming here) and a "couple of spots of blood with wiping." She is a patient of [facility] in [place], [place], but planning to switch to [facility] provider. She was dx'd with a [facility] on 2/3, but has had an U/S since then where the [facility] has been resolved.  Past Medical History:  Diagnosis Date  . Dysmenorrhea   . Migraine with aura   . Vitamin D deficiency     Past Surgical History:  Procedure Laterality Date  . abcessed tooth  03/14/2017   drained in ED of [facility]  . HERNIA REPAIR     AGE 24  . TONSILLECTOMY AND ADENOIDECTOMY    . TUBES IN EAR    . WISDOM TOOTH EXTRACTION  01/2017    Family History  Problem Relation Age of Onset  . Diabetes Mother   . Hypertension Mother   . Heart disease Father   . Diabetes Maternal Grandmother        TYPE 2  . Diabetes Maternal Grandfather        TYPE 2  . Breast cancer Other   . Cancer Other        PROSTATE    Social History   Tobacco Use  . Smoking status: Never Smoker  . Smokeless tobacco: Never Used  Substance Use Topics  . Alcohol use: No    Frequency: Never  . Drug use: No    Allergies:  Allergies  Allergen Reactions  . Peanut Oil Hives    Medications Prior to Admission  Medication Sig Dispense Refill Last Dose  . acetaminophen (TYLENOL) 500 MG tablet Take 1,000 mg by mouth every 6 (six) hours as needed.   02/19/2018  . Prenatal Vit-Fe Fumarate-FA (MULTIVITAMIN-PRENATAL) 27-0.8 MG TABS tablet Take 1 tablet by mouth daily at 12 noon.   02/21/2018 at Unknown time  . albuterol (PROVENTIL HFA) 108 (90 Base) MCG/ACT inhaler Inhale 2 puffs into the  lungs as needed.   More than a month at Unknown time    Review of Systems  Constitutional: Negative.   HENT: Negative.   Eyes: Negative.   Respiratory: Negative.   Cardiovascular: Negative.   Gastrointestinal: Negative.   Endocrine: Negative.   Genitourinary: Positive for pelvic pain (RLQ) and vaginal bleeding.  Musculoskeletal: Negative.   Skin: Negative.   Allergic/Immunologic: Negative.   Neurological: Negative.   Hematological: Negative.   Psychiatric/Behavioral: Negative.    Physical Exam   Blood pressure 115/66, pulse 99, temperature 97.9 F (36.6 C), temperature source Oral, resp. rate 18, height 5\' 4"  (1.626 m), weight (!) 139.9 kg, last menstrual period 12/15/2017, SpO2 98 %.  Physical Exam  Nursing note and vitals reviewed. Constitutional: She is oriented to person, place, and time. She appears well-developed and well-nourished.  HENT:  Head: Normocephalic and atraumatic.  Eyes: Pupils are equal, round, and reactive to light.  Neck: Normal range of motion.  Cardiovascular: Normal rate and regular rhythm.  Respiratory: Effort normal.  GI: Soft.  Genitourinary:    Genitourinary Comments: Uterus: non-tender, SE: cervix is smooth, pink,  no lesions, small amt of watery, blood tinged fluid d/c -- WP, GC/CT done, closed/long/firm, no CMT or friability, no adnexal tenderness    Musculoskeletal: Normal range of motion.  Neurological: She is alert and oriented to person, place, and time.  Skin: Skin is warm and dry.  Psychiatric: She has a normal mood and affect. Her behavior is normal. Judgment and thought content normal.    MAU Course  Procedures  MDM Wet Prep  GC/CT -- results pending  Results for orders placed or performed during the hospital encounter of 02/21/18 (from the past 24 hour(s))  Wet prep, genital     Status: None   Collection Time: 02/21/18  6:10 PM  Result Value Ref Range   Yeast Wet Prep HPF POC NONE SEEN NONE SEEN   Trich, Wet Prep NONE SEEN  NONE SEEN   Clue Cells Wet Prep HPF POC NONE SEEN NONE SEEN   WBC, Wet Prep HPF POC NONE SEEN NONE SEEN   Sperm PRESENT     US Ob Transvaginal  Result Date: 02/21/2018 CLINICAL DATA:  Right lower quadrant pain EXAM: TRANSVAGINAL OB ULTRASOUND TECHNIQUE: Transvaginal ultrasound was performed for complete evaluation of the gestation as well as the maternal uterus, adnexal regions, and pelvic cul-de-sac. COMPARISON:  02/05/2018 FINDINGS: Intrauterine gestational sac: Single Yolk sac:  Visualized. Embryo:  Visualized. Cardiac Activity: Visualized. Heart Rate: 169 bpm CRL: 26.6 mm   9 w 3 d                  [place] EDC: 09/23/2018 Subchorionic hemorrhage:  None visualized. Maternal uterus/adnexae: Nonvisualized left ovary. Right ovary is normal and measures 3.1 x 2.2 x 2.5 cm. Trace free fluid in the pelvis IMPRESSION: 1. Single viable intrauterine pregnancy as above. Resolution of previously noted subchorionic hemorrhage. 2. Trace free fluid. Electronically Signed   By: Jasmine Pang M.D.   On: 02/21/2018 19:13    Assessment and Plan  Abdominal pain complicating pregnancy  - Advised to take Tylenol 1000 mg every 6 hrs prn pain - Ok to soak in a warm tub of water  - Information provided on abd pain in pregnancy   Vaginal bleeding in pregnancy  - Advised possibly postcoital bleeding - Return to MAU if bleeding increases to where she soaks 2 pads/hr   - Discharge patient - Keep scheduled appt with [facility] on 02/22/2018 - List of [facility] OB Providers given  Raelyn Mora, MSN, CNM 02/21/2018, 5:57 PM

## 2018-02-21 NOTE — MAU Note (Signed)
Having the sharpest pain in RLQ. Pain continues.  Had a couple spots of blood at the time. Pain started during intercourse.

## 2018-02-21 NOTE — MAU Note (Signed)
Urine sent to lab 

## 2018-02-21 NOTE — Telephone Encounter (Signed)
Pt c/o sharp pain right side ovary area.  [phone]  Pt states it is a constant pain that brings tears to her eyes.  Adv if hurting that bad to be seen at ED as we have no openings.

## 2018-02-21 NOTE — Discharge Instructions (Signed)
[facility] Prenatal Care Providers °         °[facility]  ° Phone: [phone] ° °[facility] @ [facility]  ° Phone: [phone] ° °[facility] [facility]      ° Phone: [phone]   °         °[facility] @ [place]    ° Phone: [phone] °         °[facility] @ [place]  ° Phone: [phone] ° °[facility] @ [facility] ° Phone: [phone] °    °[facility] ([place]) ° Phone: [phone] ° °

## 2018-02-22 ENCOUNTER — Other Ambulatory Visit: Payer: BLUE CROSS/BLUE SHIELD

## 2018-02-22 ENCOUNTER — Ambulatory Visit (INDEPENDENT_AMBULATORY_CARE_PROVIDER_SITE_OTHER): Payer: BLUE CROSS/BLUE SHIELD | Admitting: Certified Nurse Midwife

## 2018-02-22 VITALS — BP 120/78 | Wt 307.0 lb

## 2018-02-22 DIAGNOSIS — O4691 Antepartum hemorrhage, unspecified, first trimester: Secondary | ICD-10-CM

## 2018-02-22 DIAGNOSIS — O0990 Supervision of high risk pregnancy, unspecified, unspecified trimester: Secondary | ICD-10-CM

## 2018-02-22 DIAGNOSIS — Z131 Encounter for screening for diabetes mellitus: Secondary | ICD-10-CM

## 2018-02-22 DIAGNOSIS — O4690 Antepartum hemorrhage, unspecified, unspecified trimester: Secondary | ICD-10-CM

## 2018-02-22 DIAGNOSIS — O99210 Obesity complicating pregnancy, unspecified trimester: Secondary | ICD-10-CM

## 2018-02-22 DIAGNOSIS — Z23 Encounter for immunization: Secondary | ICD-10-CM | POA: Diagnosis not present

## 2018-02-22 DIAGNOSIS — Z3A09 9 weeks gestation of pregnancy: Secondary | ICD-10-CM

## 2018-02-22 LAB — GC/CHLAMYDIA PROBE AMP (~~LOC~~) NOT AT ARMC
CHLAMYDIA, DNA PROBE: NEGATIVE
Neisseria Gonorrhea: NEGATIVE

## 2018-02-22 NOTE — Progress Notes (Signed)
ROB/GTT Some brown discharge no active bleeding today but was bleeding yesterday, 7/10 pain in RLQ  Some heartburn  Flu shot today

## 2018-02-22 NOTE — Progress Notes (Signed)
ROB and follow up at 9wk6d: Bleeding had tapered off until yesterday-started after IC along with RLQ pain.  Was seen at [facility]. Ultrasound was normal. Good FCA. No SCH, and no masses seen. Today just having some brown discharge FHTs heard with DT today at 172 BPM. NOB labs and 1 hour GTT today ROB/ MaterniT 21 in 2 weeks Farrel Conners, CNM

## 2018-02-23 ENCOUNTER — Other Ambulatory Visit: Payer: BLUE CROSS/BLUE SHIELD

## 2018-02-23 ENCOUNTER — Encounter: Payer: BLUE CROSS/BLUE SHIELD | Admitting: Maternal Newborn

## 2018-02-24 LAB — RPR+RH+ABO+RUB AB+AB SCR+CB...
Antibody Screen: NEGATIVE
HEMOGLOBIN: 12.7 g/dL (ref 11.1–15.9)
HIV Screen 4th Generation wRfx: NONREACTIVE
Hematocrit: 36.8 % (ref 34.0–46.6)
Hepatitis B Surface Ag: NEGATIVE
MCH: 32.2 pg (ref 26.6–33.0)
MCHC: 34.5 g/dL (ref 31.5–35.7)
MCV: 93 fL (ref 79–97)
Platelets: 212 10*3/uL (ref 150–450)
RBC: 3.95 x10E6/uL (ref 3.77–5.28)
RDW: 13.4 % (ref 11.7–15.4)
RPR Ser Ql: NONREACTIVE
RUBELLA: 4.91 {index} (ref >0.99)
Rh Factor: POSITIVE
Varicella zoster IgG: 839 index (ref >165)
WBC: 5.9 10*3/uL (ref 3.4–10.8)

## 2018-02-24 LAB — HEMOGLOBINOPATHY EVALUATION
HGB C: 0 %
HGB S: 0 %
HGB VARIANT: 0 %
Hemoglobin A2 Quantitation: 2.3 % (ref 1.8–3.2)
Hemoglobin F Quantitation: 0 % (ref 0.0–2.0)
Hgb A: 97.7 % (ref 96.4–98.8)

## 2018-02-24 LAB — GLUCOSE, 1 HOUR GESTATIONAL: Gestational Diabetes Screen: 76 mg/dL (ref 65–139)

## 2018-02-26 ENCOUNTER — Inpatient Hospital Stay (HOSPITAL_COMMUNITY)
Admission: AD | Admit: 2018-02-26 | Discharge: 2018-02-27 | Disposition: A | Discharge status: Home / Self Care | Payer: BLUE CROSS/BLUE SHIELD | Source: Ambulatory Visit | Attending: Obstetrics & Gynecology | Admitting: Obstetrics & Gynecology

## 2018-02-26 ENCOUNTER — Encounter (HOSPITAL_COMMUNITY): Payer: Self-pay | Admitting: *Deleted

## 2018-02-26 DIAGNOSIS — O26891 Other specified pregnancy related conditions, first trimester: Secondary | ICD-10-CM | POA: Diagnosis not present

## 2018-02-26 DIAGNOSIS — Z3A10 10 weeks gestation of pregnancy: Secondary | ICD-10-CM | POA: Insufficient documentation

## 2018-02-26 DIAGNOSIS — R109 Unspecified abdominal pain: Secondary | ICD-10-CM

## 2018-02-26 DIAGNOSIS — R1031 Right lower quadrant pain: Secondary | ICD-10-CM | POA: Diagnosis present

## 2018-02-26 LAB — URINALYSIS, ROUTINE W REFLEX MICROSCOPIC
Bilirubin Urine: NEGATIVE
GLUCOSE, UA: NEGATIVE mg/dL
Hgb urine dipstick: NEGATIVE
Ketones, ur: NEGATIVE mg/dL
Leukocytes,Ua: NEGATIVE
Nitrite: NEGATIVE
Protein, ur: NEGATIVE mg/dL
SPECIFIC GRAVITY, URINE: 1.015 (ref 1.005–1.030)
pH: 7 (ref 5.0–8.0)

## 2018-02-26 MED ORDER — IBUPROFEN 600 MG PO TABS
600.0000 mg | ORAL_TABLET | Freq: Once | ORAL | Status: AC
  Administered 2018-02-26: 600 mg via ORAL
  Filled 2018-02-26: qty 1

## 2018-02-26 NOTE — MAU Note (Signed)
Having sharp, stabbing pains R Groin area since this am. Hurts in groin when I pee. Saw alittle blood this am when I urinated. Hurts to touch and worse when I move. Tried tylenol and heating pad and not helping

## 2018-02-26 NOTE — MAU Note (Signed)
Admission called and states pt came to desk and states she is bleeding more. Pt brought back to Triage and did not see any blood on panties. Is not wearing a pad. Pt states she has been waiting almost 2 hrs. Explained due to acuity and business of unit pt could not be brought to a room at this time. Unable to give pt a wait time. Pt states her pain is increasing some. Pt is ambulatory and states she will cont to wait for now. Pt ambulated back to lobby.

## 2018-02-26 NOTE — Discharge Instructions (Signed)
Abdominal Pain During Pregnancy ° °Abdominal pain is common during pregnancy, and has many possible causes. Some causes are more serious than others, and sometimes the cause is not known. Abdominal pain can be a sign that labor is starting. It can also be caused by normal growth and stretching of muscles and ligaments during pregnancy. Always tell your health care provider if you have any abdominal pain. °Follow these instructions at home: °· Do not have sex or put anything in your vagina until your pain goes away completely. °· Get plenty of rest until your pain improves. °· Drink enough fluid to keep your urine pale yellow. °· Take over-the-counter and prescription medicines only as told by your health care provider. °· Keep all follow-up visits as told by your health care provider. This is important. °Contact a health care provider if: °· Your pain continues or gets worse after resting. °· You have lower abdominal pain that: °? Comes and goes at regular intervals. °? Spreads to your back. °? Is similar to menstrual cramps. °· You have pain or burning when you urinate. °Get help right away if: °· You have a fever or chills. °· You have vaginal bleeding. °· You are leaking fluid from your vagina. °· You are passing tissue from your vagina. °· You have vomiting or diarrhea that lasts for more than 24 hours. °· Your baby is moving less than usual. °· You feel very weak or faint. °· You have shortness of breath. °· You develop severe pain in your upper abdomen. °Summary °· Abdominal pain is common during pregnancy, and has many possible causes. °· If you experience abdominal pain during pregnancy, tell your health care provider right away. °· Follow your health care provider's home care instructions and keep all follow-up visits as directed. °This information is not intended to replace advice given to you by your health care provider. Make sure you discuss any questions you have with your health care  provider. °Document Released: 12/22/2004 Document Revised: 03/26/2016 Document Reviewed: 03/26/2016 °Elsevier Interactive Patient Education © 2019 [employer] Inc[employer] ° °

## 2018-02-26 NOTE — MAU Provider Note (Signed)
History     CSN: [id number]  Arrival date and time: 02/26/18 1839   First Provider Initiated Contact with Patient 02/26/18 2205      No chief complaint on file.  HPI   Ms.Paige Blanchard is a 23 y.o. female G2P0010 @ [redacted]w[redacted]d returing today with worsening RLQ pain. She was seen for this pain on 2/17 and says the pain is worse and now she has blood in her urine. +dysuria, and hematuria which started today. The pain does not radiate.  Says she is urinating however it is painful. No vaginal bleeding, only see's blood in her urine.  The patient has had 6 [place] since + pregnancy test. Subchorionic hemorrhage has resolved.   Taking tylenol which is not helping.   OB History    Gravida  2   Para  0   Term  0   Preterm  0   AB  1   Living  0     SAB  1   TAB  0   Ectopic  0   Multiple  0   Live Births  0           Past Medical History:  Diagnosis Date  . Dysmenorrhea   . Migraine with aura   . Vitamin D deficiency     Past Surgical History:  Procedure Laterality Date  . abcessed tooth  03/14/2017   drained in ED of [facility]  . HERNIA REPAIR     AGE 40  . TONSILLECTOMY AND ADENOIDECTOMY    . TUBES IN EAR    . WISDOM TOOTH EXTRACTION  01/2017    Family History  Problem Relation Age of Onset  . Diabetes Mother   . Hypertension Mother   . Heart disease Father   . Diabetes Maternal Grandmother        TYPE 2  . Diabetes Maternal Grandfather        TYPE 2  . Breast cancer Other   . Cancer Other        PROSTATE    Social History   Tobacco Use  . Smoking status: Never Smoker  . Smokeless tobacco: Never Used  Substance Use Topics  . Alcohol use: No    Frequency: Never  . Drug use: No    Allergies:  Allergies  Allergen Reactions  . Peanut Oil Hives    Medications Prior to Admission  Medication Sig Dispense Refill Last Dose  . acetaminophen (TYLENOL) 500 MG tablet Take 1,000 mg by mouth every 6 (six) hours as needed.   Taking  . albuterol (PROVENTIL  HFA) 108 (90 Base) MCG/ACT inhaler Inhale 2 puffs into the lungs as needed.   Taking  . Prenatal Vit-Fe Fumarate-FA (MULTIVITAMIN-PRENATAL) 27-0.8 MG TABS tablet Take 1 tablet by mouth daily at 12 noon.   Taking   Results for orders placed or performed during the hospital encounter of 02/26/18 (from the past 48 hour(s))  Urinalysis, Routine w reflex microscopic     Status: None   Collection Time: 02/26/18  7:14 PM  Result Value Ref Range   Color, Urine YELLOW YELLOW   APPearance CLEAR CLEAR   Specific Gravity, Urine 1.015 1.005 - 1.030   pH 7.0 5.0 - 8.0   Glucose, UA NEGATIVE NEGATIVE mg/dL   Hgb urine dipstick NEGATIVE NEGATIVE   Bilirubin Urine NEGATIVE NEGATIVE   Ketones, ur NEGATIVE NEGATIVE mg/dL   Protein, ur NEGATIVE NEGATIVE mg/dL   Nitrite NEGATIVE NEGATIVE   Leukocytes,Ua NEGATIVE NEGATIVE  Comment: Microscopic not done on urines with negative protein, blood, leukocytes, nitrite, or glucose < 500 mg/dL. Performed at [facility], [address] [place], [place] [address]    Review of Systems  Genitourinary: Positive for dysuria and hematuria.   Physical Exam   Blood pressure (!) 118/54, pulse 92, temperature 98.6 F (37 C), resp. rate 18, height 5\' 4"  (1.626 m), weight (!) 140.6 kg, last menstrual period 12/15/2017.  Physical Exam  Constitutional: She is oriented to person, place, and time. She appears well-developed and well-nourished. No distress.  GI: Soft. She exhibits no distension. There is no hepatosplenomegaly. There is abdominal tenderness in the suprapubic area. There is no rigidity, no rebound, no guarding and no CVA tenderness.  Genitourinary:    Genitourinary Comments: Cervix closed No adnexal tenderness on exam No blood noted on exam glove.    Neurological: She is alert and oriented to person, place, and time.  Skin: Skin is warm. She is not diaphoretic.  Psychiatric: Her behavior is normal.    MAU Course  Procedures   Pt informed that the  ultrasound is considered a limited OB ultrasound and is not intended to be a complete ultrasound exam.  Patient also informed that the ultrasound is not being completed with the intent of assessing for fetal or placental anomalies or any pelvic abnormalities.  Explained that the purpose of today's ultrasound is to assess for  viability.  Patient acknowledges the purpose of the exam and the limitations of the study.     MDM  Ibuprofen 600 mg PO. Pain resolved  A positive blood type  Bedside US done and active fetus with + Hr noted, patient reassured.   Assessment and Plan   A:  1. Abdominal pain during pregnancy in first trimester     P:  Discharge home in stable condition Patient reassured after [place].  Ok to use ibuprofen sparingly as needed for pain Return to MAU if symptoms worsen    Kimarion Chery, Harolyn Rutherford, NP 02/28/2018 9:57 AM

## 2018-02-27 DIAGNOSIS — O26891 Other specified pregnancy related conditions, first trimester: Secondary | ICD-10-CM | POA: Diagnosis not present

## 2018-02-27 NOTE — Progress Notes (Signed)
J Rasch NP in to discuss d/c plan with pt. Written and verbal d/c instructions given and understanding voiced

## 2018-03-08 ENCOUNTER — Ambulatory Visit (INDEPENDENT_AMBULATORY_CARE_PROVIDER_SITE_OTHER): Payer: BLUE CROSS/BLUE SHIELD

## 2018-03-08 ENCOUNTER — Ambulatory Visit (INDEPENDENT_AMBULATORY_CARE_PROVIDER_SITE_OTHER): Payer: BLUE CROSS/BLUE SHIELD | Admitting: Maternal Newborn

## 2018-03-08 ENCOUNTER — Encounter: Payer: Self-pay | Admitting: Maternal Newborn

## 2018-03-08 VITALS — BP 116/70 | Wt 305.0 lb

## 2018-03-08 DIAGNOSIS — O209 Hemorrhage in early pregnancy, unspecified: Secondary | ICD-10-CM

## 2018-03-08 DIAGNOSIS — Z369 Encounter for antenatal screening, unspecified: Secondary | ICD-10-CM

## 2018-03-08 DIAGNOSIS — O0990 Supervision of high risk pregnancy, unspecified, unspecified trimester: Secondary | ICD-10-CM

## 2018-03-08 DIAGNOSIS — O0991 Supervision of high risk pregnancy, unspecified, first trimester: Secondary | ICD-10-CM

## 2018-03-08 DIAGNOSIS — Z3A11 11 weeks gestation of pregnancy: Secondary | ICD-10-CM | POA: Diagnosis not present

## 2018-03-08 DIAGNOSIS — Z1379 Encounter for other screening for genetic and chromosomal anomalies: Secondary | ICD-10-CM

## 2018-03-08 NOTE — Progress Notes (Signed)
Routine Prenatal Care Visit  Subjective  Paige Blanchard is a 23 y.o. G2P0010 at [redacted]w[redacted]d being seen today for ongoing prenatal care.  She is currently monitored for the following issues for this high-risk pregnancy and has Migraine; Class 3 severe obesity due to excess calories without serious comorbidity with body mass index (BMI) of 50.0 to 59.9 in adult [facility]); Recurrent major depressive disorder (HCC); Mastodynia; Peanut allergy; LGSIL on Pap smear of cervix; Supervision of high risk pregnancy, antepartum; and Abdominal pain complicating pregnancy on their problem list.  ----------------------------------------------------------------------------------- Patient reports nausea. She does not currently have vaginal bleeding, but reports some bleeding last Saturday.  Vag. Bleeding: None.  No leaking of fluid.  ----------------------------------------------------------------------------------- The following portions of the patient's history were reviewed and updated as appropriate: allergies, current medications, past family history, past medical history, past social history, past surgical history and problem list. Problem list updated.  Objective  Blood pressure 116/70, weight (!) 305 lb (138.3 kg), last menstrual period 12/15/2017. Pregravid weight 291 lb (132 kg) Total Weight Gain 14 lb (6.35 kg) Body mass index is 52.35 kg/m.   Fetal Status: Fetal Heart Rate (bpm): 167         General:  Alert, oriented and cooperative. Patient is in no acute distress.  Skin: Skin is warm and dry. No rash noted.   Cardiovascular: Normal heart rate noted  Respiratory: Normal respiratory effort, no problems with respiration noted  Abdomen: Soft, gravid, appropriate for gestational age. Pain/Pressure: Absent     Pelvic:  Cervical exam deferred        Extremities: Normal range of motion.     Mental Status: Normal mood and affect. Normal behavior. Normal judgment and thought content.    Assessment   22  y.o. G2P0010 at [redacted]w[redacted]d, EDD 09/21/2018 by Last Menstrual Period presenting for a routine prenatal visit.  Plan   pregnancy Problems (from 01/26/18 to present)    Problem Noted Resolved   Abdominal pain complicating pregnancy 02/21/2018 by Raelyn Mora, CNM No   Supervision of high risk pregnancy, antepartum 02/02/2018 by Tresea Mall, CNM No   Overview Addendum 03/08/2018  8:37 AM by Oswaldo Conroy, CNM    Clinic [facility] Prenatal Labs  Dating  Blood type: A/Positive/-- (02/18 1513)   Genetic Screen 1 Screen:    AFP:     Quad:     NIPS: Antibody:Negative (02/18 1513)  Anatomic [place]  Rubella: 4.91 (02/18 1513) Varicella: @VZVIGG @  GTT Early:               Third trimester:  RPR: Non Reactive (02/18 1513)   Rhogam  HBsAg: Negative (02/18 1513)   TDaP vaccine                       Flu Shot: HIV: Non Reactive (02/18 1513)   Baby Food                                GBS:   Contraception  Pap:  CBB     CS/VBAC    Support Person            Ultrasound ordered today due to recent vaginal bleeding and difficulty auscultating FHT with Doppler. Normal findings on scan with FHR at 167.   MaterniT21 collected today.  Please refer to After Visit Summary for other counseling recommendations.   Return in about 4 weeks (around 04/05/2018)  for HROB/MD visit.  Marcelyn Bruins, CNM 03/08/2018

## 2018-03-08 NOTE — Progress Notes (Signed)
ROB C/o nausea  

## 2018-03-08 NOTE — Patient Instructions (Addendum)
First Trimester of Pregnancy The first trimester of pregnancy is from week 1 until the end of week 13 (months 1 through 3). A week after a sperm fertilizes an egg, the egg will implant on the wall of the uterus. This embryo will begin to develop into a baby. Genes from you and your partner will form the baby. The female genes will determine whether the baby will be a boy or a girl. At 6-8 weeks, the eyes and face will be formed, and the heartbeat can be seen on ultrasound. At the end of 12 weeks, all the baby's organs will be formed. Now that you are pregnant, you will want to do everything you can to have a healthy baby. Two of the most important things are to get good prenatal care and to follow your health care provider's instructions. Prenatal care is all the medical care you receive before the baby's birth. This care will help prevent, find, and treat any problems during the pregnancy and childbirth. Body changes during your first trimester Your body goes through many changes during pregnancy. The changes vary from woman to woman.  You may gain or lose a couple of pounds at first.  You may feel sick to your stomach (nauseous) and you may throw up (vomit). If the vomiting is uncontrollable, call your health care provider.  You may tire easily.  You may develop headaches that can be relieved by medicines. All medicines should be approved by your health care provider.  You may urinate more often. Painful urination may mean you have a bladder infection.  You may develop heartburn as a result of your pregnancy.  You may develop constipation because certain hormones are causing the muscles that push stool through your intestines to slow down.  You may develop hemorrhoids or swollen veins (varicose veins).  Your breasts may begin to grow larger and become tender. Your nipples may stick out more, and the tissue that surrounds them (areola) may become darker.  Your gums may bleed and may be  sensitive to brushing and flossing.  Dark spots or blotches (chloasma, mask of pregnancy) may develop on your face. This will likely fade after the baby is born.  Your menstrual periods will stop.  You may have a loss of appetite.  You may develop cravings for certain kinds of food.  You may have changes in your emotions from day to day, such as being excited to be pregnant or being concerned that something may go wrong with the pregnancy and baby.  You may have more vivid and strange dreams.  You may have changes in your hair. These can include thickening of your hair, rapid growth, and changes in texture. Some women also have hair loss during or after pregnancy, or hair that feels dry or thin. Your hair will most likely return to normal after your baby is born. What to expect at prenatal visits During a routine prenatal visit:  You will be weighed to make sure you and the baby are growing normally.  Your blood pressure will be taken.  Your abdomen will be measured to track your baby's growth.  The fetal heartbeat will be listened to between weeks 10 and 14 of your pregnancy.  Test results from any previous visits will be discussed. Your health care provider may ask you:  How you are feeling.  If you are feeling the baby move.  If you have had any abnormal symptoms, such as leaking fluid, bleeding, severe headaches, or abdominal   cramping.  If you are using any tobacco products, including cigarettes, chewing tobacco, and electronic cigarettes.  If you have any questions. Other tests that may be performed during your first trimester include:  Blood tests to find your blood type and to check for the presence of any previous infections. The tests will also be used to check for low iron levels (anemia) and protein on red blood cells (Rh antibodies). Depending on your risk factors, or if you previously had diabetes during pregnancy, you may have tests to check for high blood sugar  that affects pregnant women (gestational diabetes).  Urine tests to check for infections, diabetes, or protein in the urine.  An ultrasound to confirm the proper growth and development of the baby.  Fetal screens for spinal cord problems (spina bifida) and Down syndrome.  HIV (human immunodeficiency virus) testing. Routine prenatal testing includes screening for HIV, unless you choose not to have this test.  You may need other tests to make sure you and the baby are doing well. Follow these instructions at home: Medicines  Follow your health care provider's instructions regarding medicine use. Specific medicines may be either safe or unsafe to take during pregnancy.  Take a prenatal vitamin that contains at least 600 micrograms (mcg) of folic acid.  If you develop constipation, try taking a stool softener if your health care provider approves. Eating and drinking   Eat a balanced diet that includes fresh fruits and vegetables, whole grains, good sources of protein such as meat, eggs, or tofu, and low-fat dairy. Your health care provider will help you determine the amount of weight gain that is right for you.  Avoid raw meat and uncooked cheese. These carry germs that can cause birth defects in the baby.  Eating four or five small meals rather than three large meals a day may help relieve nausea and vomiting. If you start to feel nauseous, eating a few soda crackers can be helpful. Drinking liquids between meals, instead of during meals, also seems to help ease nausea and vomiting.  Limit foods that are high in fat and processed sugars, such as fried and sweet foods.  To prevent constipation: ? Eat foods that are high in fiber, such as fresh fruits and vegetables, whole grains, and beans. ? Drink enough fluid to keep your urine clear or pale yellow. Activity  Exercise only as directed by your health care provider. Most women can continue their usual exercise routine during  pregnancy. Try to exercise for 30 minutes at least 5 days a week. Exercising will help you: ? Control your weight. ? Stay in shape. ? Be prepared for labor and delivery.  Experiencing pain or cramping in the lower abdomen or lower back is a good sign that you should stop exercising. Check with your health care provider before continuing with normal exercises.  Try to avoid standing for long periods of time. Move your legs often if you must stand in one place for a long time.  Avoid heavy lifting.  Wear low-heeled shoes and practice good posture.  You may continue to have sex unless your health care provider tells you not to. Relieving pain and discomfort  Wear a good support bra to relieve breast tenderness.  Take warm sitz baths to soothe any pain or discomfort caused by hemorrhoids. Use hemorrhoid cream if your health care provider approves.  Rest with your legs elevated if you have leg cramps or low back pain.  If you develop varicose veins in   your legs, wear support hose. Elevate your feet for 15 minutes, 3-4 times a day. Limit salt in your diet. Prenatal care  Schedule your prenatal visits by the twelfth week of pregnancy. They are usually scheduled monthly at first, then more often in the last 2 months before delivery.  Write down your questions. Take them to your prenatal visits.  Keep all your prenatal visits as told by your health care provider. This is important. Safety  Wear your seat belt at all times when driving.  Make a list of emergency phone numbers, including numbers for family, friends, the hospital, and police and fire departments. General instructions  Ask your health care provider for a referral to a local prenatal education class. Begin classes no later than the beginning of month 6 of your pregnancy.  Ask for help if you have counseling or nutritional needs during pregnancy. Your health care provider can offer advice or refer you to specialists for help  with various needs.  Do not use hot tubs, steam rooms, or saunas.  Do not douche or use tampons or scented sanitary pads.  Do not cross your legs for long periods of time.  Avoid cat litter boxes and soil used by cats. These carry germs that can cause birth defects in the baby and possibly loss of the fetus by miscarriage or stillbirth.  Avoid all smoking, herbs, alcohol, and medicines not prescribed by your health care provider. Chemicals in these products affect the formation and growth of the baby.  Do not use any products that contain nicotine or tobacco, such as cigarettes and e-cigarettes. If you need help quitting, ask your health care provider. You may receive counseling support and other resources to help you quit.  Schedule a dentist appointment. At home, brush your teeth with a soft toothbrush and be gentle when you floss. Contact a health care provider if:  You have dizziness.  You have mild pelvic cramps, pelvic pressure, or nagging pain in the abdominal area.  You have persistent nausea, vomiting, or diarrhea.  You have a bad smelling vaginal discharge.  You have pain when you urinate.  You notice increased swelling in your face, hands, legs, or ankles.  You are exposed to fifth disease or chickenpox.  You are exposed to [place] measles (rubella) and have never had it. Get help right away if:  You have a fever.  You are leaking fluid from your vagina.  You have spotting or bleeding from your vagina.  You have severe abdominal cramping or pain.  You have rapid weight gain or loss.  You vomit blood or material that looks like coffee grounds.  You develop a severe headache.  You have shortness of breath.  You have any kind of trauma, such as from a fall or a car accident. Summary  The first trimester of pregnancy is from week 1 until the end of week 13 (months 1 through 3).  Your body goes through many changes during pregnancy. The changes vary from  woman to woman.  You will have routine prenatal visits. During those visits, your health care provider will examine you, discuss any test results you may have, and talk with you about how you are feeling. This information is not intended to replace advice given to you by your health care provider. Make sure you discuss any questions you have with your health care provider. Document Released: 12/16/2000 Document Revised: 12/04/2015 Document Reviewed: 12/04/2015 Elsevier Interactive Patient Education  2019 [employer].  First  Trimester of Pregnancy The first trimester of pregnancy is from week 1 until the end of week 13 (months 1 through 3). A week after a sperm fertilizes an egg, the egg will implant on the wall of the uterus. This embryo will begin to develop into a baby. Genes from you and your partner will form the baby. The female genes will determine whether the baby will be a boy or a girl. At 6-8 weeks, the eyes and face will be formed, and the heartbeat can be seen on ultrasound. At the end of 12 weeks, all the baby's organs will be formed. Now that you are pregnant, you will want to do everything you can to have a healthy baby. Two of the most important things are to get good prenatal care and to follow your health care provider's instructions. Prenatal care is all the medical care you receive before the baby's birth. This care will help prevent, find, and treat any problems during the pregnancy and childbirth. Body changes during your first trimester Your body goes through many changes during pregnancy. The changes vary from woman to woman.  You may gain or lose a couple of pounds at first.  You may feel sick to your stomach (nauseous) and you may throw up (vomit). If the vomiting is uncontrollable, call your health care provider.  You may tire easily.  You may develop headaches that can be relieved by medicines. All medicines should be approved by your health care provider.  You may  urinate more often. Painful urination may mean you have a bladder infection.  You may develop heartburn as a result of your pregnancy.  You may develop constipation because certain hormones are causing the muscles that push stool through your intestines to slow down.  You may develop hemorrhoids or swollen veins (varicose veins).  Your breasts may begin to grow larger and become tender. Your nipples may stick out more, and the tissue that surrounds them (areola) may become darker.  Your gums may bleed and may be sensitive to brushing and flossing.  Dark spots or blotches (chloasma, mask of pregnancy) may develop on your face. This will likely fade after the baby is born.  Your menstrual periods will stop.  You may have a loss of appetite.  You may develop cravings for certain kinds of food.  You may have changes in your emotions from day to day, such as being excited to be pregnant or being concerned that something may go wrong with the pregnancy and baby.  You may have more vivid and strange dreams.  You may have changes in your hair. These can include thickening of your hair, rapid growth, and changes in texture. Some women also have hair loss during or after pregnancy, or hair that feels dry or thin. Your hair will most likely return to normal after your baby is born. What to expect at prenatal visits During a routine prenatal visit:  You will be weighed to make sure you and the baby are growing normally.  Your blood pressure will be taken.  Your abdomen will be measured to track your baby's growth.  The fetal heartbeat will be listened to between weeks 10 and 14 of your pregnancy.  Test results from any previous visits will be discussed. Your health care provider may ask you:  How you are feeling.  If you are feeling the baby move.  If you have had any abnormal symptoms, such as leaking fluid, bleeding, severe headaches, or abdominal cramping.  If you are using any  tobacco products, including cigarettes, chewing tobacco, and electronic cigarettes.  If you have any questions. Other tests that may be performed during your first trimester include:  Blood tests to find your blood type and to check for the presence of any previous infections. The tests will also be used to check for low iron levels (anemia) and protein on red blood cells (Rh antibodies). Depending on your risk factors, or if you previously had diabetes during pregnancy, you may have tests to check for high blood sugar that affects pregnant women (gestational diabetes).  Urine tests to check for infections, diabetes, or protein in the urine.  An ultrasound to confirm the proper growth and development of the baby.  Fetal screens for spinal cord problems (spina bifida) and Down syndrome.  HIV (human immunodeficiency virus) testing. Routine prenatal testing includes screening for HIV, unless you choose not to have this test.  You may need other tests to make sure you and the baby are doing well. Follow these instructions at home: Medicines  Follow your health care provider's instructions regarding medicine use. Specific medicines may be either safe or unsafe to take during pregnancy.  Take a prenatal vitamin that contains at least 600 micrograms (mcg) of folic acid.  If you develop constipation, try taking a stool softener if your health care provider approves. Eating and drinking   Eat a balanced diet that includes fresh fruits and vegetables, whole grains, good sources of protein such as meat, eggs, or tofu, and low-fat dairy. Your health care provider will help you determine the amount of weight gain that is right for you.  Avoid raw meat and uncooked cheese. These carry germs that can cause birth defects in the baby.  Eating four or five small meals rather than three large meals a day may help relieve nausea and vomiting. If you start to feel nauseous, eating a few soda crackers can  be helpful. Drinking liquids between meals, instead of during meals, also seems to help ease nausea and vomiting.  Limit foods that are high in fat and processed sugars, such as fried and sweet foods.  To prevent constipation: ? Eat foods that are high in fiber, such as fresh fruits and vegetables, whole grains, and beans. ? Drink enough fluid to keep your urine clear or pale yellow. Activity  Exercise only as directed by your health care provider. Most women can continue their usual exercise routine during pregnancy. Try to exercise for 30 minutes at least 5 days a week. Exercising will help you: ? Control your weight. ? Stay in shape. ? Be prepared for labor and delivery.  Experiencing pain or cramping in the lower abdomen or lower back is a good sign that you should stop exercising. Check with your health care provider before continuing with normal exercises.  Try to avoid standing for long periods of time. Move your legs often if you must stand in one place for a long time.  Avoid heavy lifting.  Wear low-heeled shoes and practice good posture.  You may continue to have sex unless your health care provider tells you not to. Relieving pain and discomfort  Wear a good support bra to relieve breast tenderness.  Take warm sitz baths to soothe any pain or discomfort caused by hemorrhoids. Use hemorrhoid cream if your health care provider approves.  Rest with your legs elevated if you have leg cramps or low back pain.  If you develop varicose veins in your legs,   wear support hose. Elevate your feet for 15 minutes, 3-4 times a day. Limit salt in your diet. Prenatal care  Schedule your prenatal visits by the twelfth week of pregnancy. They are usually scheduled monthly at first, then more often in the last 2 months before delivery.  Write down your questions. Take them to your prenatal visits.  Keep all your prenatal visits as told by your health care provider. This is  important. Safety  Wear your seat belt at all times when driving.  Make a list of emergency phone numbers, including numbers for family, friends, the hospital, and police and fire departments. General instructions  Ask your health care provider for a referral to a local prenatal education class. Begin classes no later than the beginning of month 6 of your pregnancy.  Ask for help if you have counseling or nutritional needs during pregnancy. Your health care provider can offer advice or refer you to specialists for help with various needs.  Do not use hot tubs, steam rooms, or saunas.  Do not douche or use tampons or scented sanitary pads.  Do not cross your legs for long periods of time.  Avoid cat litter boxes and soil used by cats. These carry germs that can cause birth defects in the baby and possibly loss of the fetus by miscarriage or stillbirth.  Avoid all smoking, herbs, alcohol, and medicines not prescribed by your health care provider. Chemicals in these products affect the formation and growth of the baby.  Do not use any products that contain nicotine or tobacco, such as cigarettes and e-cigarettes. If you need help quitting, ask your health care provider. You may receive counseling support and other resources to help you quit.  Schedule a dentist appointment. At home, brush your teeth with a soft toothbrush and be gentle when you floss. Contact a health care provider if:  You have dizziness.  You have mild pelvic cramps, pelvic pressure, or nagging pain in the abdominal area.  You have persistent nausea, vomiting, or diarrhea.  You have a bad smelling vaginal discharge.  You have pain when you urinate.  You notice increased swelling in your face, hands, legs, or ankles.  You are exposed to fifth disease or chickenpox.  You are exposed to [place] measles (rubella) and have never had it. Get help right away if:  You have a fever.  You are leaking fluid from your  vagina.  You have spotting or bleeding from your vagina.  You have severe abdominal cramping or pain.  You have rapid weight gain or loss.  You vomit blood or material that looks like coffee grounds.  You develop a severe headache.  You have shortness of breath.  You have any kind of trauma, such as from a fall or a car accident. Summary  The first trimester of pregnancy is from week 1 until the end of week 13 (months 1 through 3).  Your body goes through many changes during pregnancy. The changes vary from woman to woman.  You will have routine prenatal visits. During those visits, your health care provider will examine you, discuss any test results you may have, and talk with you about how you are feeling. This information is not intended to replace advice given to you by your health care provider. Make sure you discuss any questions you have with your health care provider. Document Released: 12/16/2000 Document Revised: 12/04/2015 Document Reviewed: 12/04/2015 Elsevier Interactive Patient Education  2019 [employer].  First Trimester of  Pregnancy The first trimester of pregnancy is from week 1 until the end of week 13 (months 1 through 3). A week after a sperm fertilizes an egg, the egg will implant on the wall of the uterus. This embryo will begin to develop into a baby. Genes from you and your partner will form the baby. The female genes will determine whether the baby will be a boy or a girl. At 6-8 weeks, the eyes and face will be formed, and the heartbeat can be seen on ultrasound. At the end of 12 weeks, all the baby's organs will be formed. Now that you are pregnant, you will want to do everything you can to have a healthy baby. Two of the most important things are to get good prenatal care and to follow your health care provider's instructions. Prenatal care is all the medical care you receive before the baby's birth. This care will help prevent, find, and treat any problems  during the pregnancy and childbirth. Body changes during your first trimester Your body goes through many changes during pregnancy. The changes vary from woman to woman.  You may gain or lose a couple of pounds at first.  You may feel sick to your stomach (nauseous) and you may throw up (vomit). If the vomiting is uncontrollable, call your health care provider.  You may tire easily.  You may develop headaches that can be relieved by medicines. All medicines should be approved by your health care provider.  You may urinate more often. Painful urination may mean you have a bladder infection.  You may develop heartburn as a result of your pregnancy.  You may develop constipation because certain hormones are causing the muscles that push stool through your intestines to slow down.  You may develop hemorrhoids or swollen veins (varicose veins).  Your breasts may begin to grow larger and become tender. Your nipples may stick out more, and the tissue that surrounds them (areola) may become darker.  Your gums may bleed and may be sensitive to brushing and flossing.  Dark spots or blotches (chloasma, mask of pregnancy) may develop on your face. This will likely fade after the baby is born.  Your menstrual periods will stop.  You may have a loss of appetite.  You may develop cravings for certain kinds of food.  You may have changes in your emotions from day to day, such as being excited to be pregnant or being concerned that something may go wrong with the pregnancy and baby.  You may have more vivid and strange dreams.  You may have changes in your hair. These can include thickening of your hair, rapid growth, and changes in texture. Some women also have hair loss during or after pregnancy, or hair that feels dry or thin. Your hair will most likely return to normal after your baby is born. What to expect at prenatal visits During a routine prenatal visit:  You will be weighed to make  sure you and the baby are growing normally.  Your blood pressure will be taken.  Your abdomen will be measured to track your baby's growth.  The fetal heartbeat will be listened to between weeks 10 and 14 of your pregnancy.  Test results from any previous visits will be discussed. Your health care provider may ask you:  How you are feeling.  If you are feeling the baby move.  If you have had any abnormal symptoms, such as leaking fluid, bleeding, severe headaches, or abdominal cramping.  If you are using any tobacco products, including cigarettes, chewing tobacco, and electronic cigarettes.  If you have any questions. Other tests that may be performed during your first trimester include:  Blood tests to find your blood type and to check for the presence of any previous infections. The tests will also be used to check for low iron levels (anemia) and protein on red blood cells (Rh antibodies). Depending on your risk factors, or if you previously had diabetes during pregnancy, you may have tests to check for high blood sugar that affects pregnant women (gestational diabetes).  Urine tests to check for infections, diabetes, or protein in the urine.  An ultrasound to confirm the proper growth and development of the baby.  Fetal screens for spinal cord problems (spina bifida) and Down syndrome.  HIV (human immunodeficiency virus) testing. Routine prenatal testing includes screening for HIV, unless you choose not to have this test.  You may need other tests to make sure you and the baby are doing well. Follow these instructions at home: Medicines  Follow your health care provider's instructions regarding medicine use. Specific medicines may be either safe or unsafe to take during pregnancy.  Take a prenatal vitamin that contains at least 600 micrograms (mcg) of folic acid.  If you develop constipation, try taking a stool softener if your health care provider approves. Eating and  drinking   Eat a balanced diet that includes fresh fruits and vegetables, whole grains, good sources of protein such as meat, eggs, or tofu, and low-fat dairy. Your health care provider will help you determine the amount of weight gain that is right for you.  Avoid raw meat and uncooked cheese. These carry germs that can cause birth defects in the baby.  Eating four or five small meals rather than three large meals a day may help relieve nausea and vomiting. If you start to feel nauseous, eating a few soda crackers can be helpful. Drinking liquids between meals, instead of during meals, also seems to help ease nausea and vomiting.  Limit foods that are high in fat and processed sugars, such as fried and sweet foods.  To prevent constipation: ? Eat foods that are high in fiber, such as fresh fruits and vegetables, whole grains, and beans. ? Drink enough fluid to keep your urine clear or pale yellow. Activity  Exercise only as directed by your health care provider. Most women can continue their usual exercise routine during pregnancy. Try to exercise for 30 minutes at least 5 days a week. Exercising will help you: ? Control your weight. ? Stay in shape. ? Be prepared for labor and delivery.  Experiencing pain or cramping in the lower abdomen or lower back is a good sign that you should stop exercising. Check with your health care provider before continuing with normal exercises.  Try to avoid standing for long periods of time. Move your legs often if you must stand in one place for a long time.  Avoid heavy lifting.  Wear low-heeled shoes and practice good posture.  You may continue to have sex unless your health care provider tells you not to. Relieving pain and discomfort  Wear a good support bra to relieve breast tenderness.  Take warm sitz baths to soothe any pain or discomfort caused by hemorrhoids. Use hemorrhoid cream if your health care provider approves.  Rest with your  legs elevated if you have leg cramps or low back pain.  If you develop varicose veins in your legs,  wear support hose. Elevate your feet for 15 minutes, 3-4 times a day. Limit salt in your diet. Prenatal care  Schedule your prenatal visits by the twelfth week of pregnancy. They are usually scheduled monthly at first, then more often in the last 2 months before delivery.  Write down your questions. Take them to your prenatal visits.  Keep all your prenatal visits as told by your health care provider. This is important. Safety  Wear your seat belt at all times when driving.  Make a list of emergency phone numbers, including numbers for family, friends, the hospital, and police and fire departments. General instructions  Ask your health care provider for a referral to a local prenatal education class. Begin classes no later than the beginning of month 6 of your pregnancy.  Ask for help if you have counseling or nutritional needs during pregnancy. Your health care provider can offer advice or refer you to specialists for help with various needs.  Do not use hot tubs, steam rooms, or saunas.  Do not douche or use tampons or scented sanitary pads.  Do not cross your legs for long periods of time.  Avoid cat litter boxes and soil used by cats. These carry germs that can cause birth defects in the baby and possibly loss of the fetus by miscarriage or stillbirth.  Avoid all smoking, herbs, alcohol, and medicines not prescribed by your health care provider. Chemicals in these products affect the formation and growth of the baby.  Do not use any products that contain nicotine or tobacco, such as cigarettes and e-cigarettes. If you need help quitting, ask your health care provider. You may receive counseling support and other resources to help you quit.  Schedule a dentist appointment. At home, brush your teeth with a soft toothbrush and be gentle when you floss. Contact a health care provider  if:  You have dizziness.  You have mild pelvic cramps, pelvic pressure, or nagging pain in the abdominal area.  You have persistent nausea, vomiting, or diarrhea.  You have a bad smelling vaginal discharge.  You have pain when you urinate.  You notice increased swelling in your face, hands, legs, or ankles.  You are exposed to fifth disease or chickenpox.  You are exposed to [place] measles (rubella) and have never had it. Get help right away if:  You have a fever.  You are leaking fluid from your vagina.  You have spotting or bleeding from your vagina.  You have severe abdominal cramping or pain.  You have rapid weight gain or loss.  You vomit blood or material that looks like coffee grounds.  You develop a severe headache.  You have shortness of breath.  You have any kind of trauma, such as from a fall or a car accident. Summary  The first trimester of pregnancy is from week 1 until the end of week 13 (months 1 through 3).  Your body goes through many changes during pregnancy. The changes vary from woman to woman.  You will have routine prenatal visits. During those visits, your health care provider will examine you, discuss any test results you may have, and talk with you about how you are feeling. This information is not intended to replace advice given to you by your health care provider. Make sure you discuss any questions you have with your health care provider. Document Released: 12/16/2000 Document Revised: 12/04/2015 Document Reviewed: 12/04/2015 Elsevier Interactive Patient Education  2019 [employer].

## 2018-03-09 ENCOUNTER — Telehealth: Payer: Self-pay

## 2018-03-09 NOTE — Telephone Encounter (Signed)
Pt is 12w and is having trouble sleeping.  Knows she can't take melatonin, but can she take 2 Benadryl?  [phone]  Adv she may take unisom, melatonin, or benadryl sparingly.  Adv may try: no caffein after 3pm, no heavy meal, warm bath, turn lights down low, limit screen time - set the mood for sleep.

## 2018-03-12 LAB — MATERNIT 21 PLUS CORE, BLOOD
Chromosome 13: NEGATIVE
Chromosome 18: NEGATIVE
Chromosome 21: NEGATIVE
Fetal Fraction: 4
Y Chromosome: NOT DETECTED

## 2018-03-14 ENCOUNTER — Telehealth: Payer: Self-pay

## 2018-03-14 NOTE — Telephone Encounter (Signed)
Pt is [redacted]w[redacted]d; saw brownish/purpleish d/c about an hour ago.  Does she need to come in or is this normal?  [phone]  Adv brown means it's old.  To be seen if becomes more like a period. Pt also asked about the gender test.  Adv it takes about 14d to get back.

## 2018-03-15 ENCOUNTER — Ambulatory Visit (INDEPENDENT_AMBULATORY_CARE_PROVIDER_SITE_OTHER): Payer: BLUE CROSS/BLUE SHIELD | Admitting: Obstetrics and Gynecology

## 2018-03-15 VITALS — BP 112/60 | Wt 304.0 lb

## 2018-03-15 DIAGNOSIS — Z3A12 12 weeks gestation of pregnancy: Secondary | ICD-10-CM | POA: Diagnosis not present

## 2018-03-15 DIAGNOSIS — O99211 Obesity complicating pregnancy, first trimester: Secondary | ICD-10-CM

## 2018-03-15 DIAGNOSIS — O0990 Supervision of high risk pregnancy, unspecified, unspecified trimester: Secondary | ICD-10-CM

## 2018-03-15 DIAGNOSIS — Z6841 Body Mass Index (BMI) 40.0 and over, adult: Secondary | ICD-10-CM

## 2018-03-15 DIAGNOSIS — O99210 Obesity complicating pregnancy, unspecified trimester: Secondary | ICD-10-CM | POA: Insufficient documentation

## 2018-03-15 DIAGNOSIS — O208 Other hemorrhage in early pregnancy: Secondary | ICD-10-CM | POA: Diagnosis not present

## 2018-03-15 NOTE — Progress Notes (Signed)
OB problem visit C/o brown discharge, severe pain x 2 days Asked if we have someone she can see for first time moms, she having anxiety and stress EPDS given and GAD-7

## 2018-03-15 NOTE — Progress Notes (Signed)
Routine Prenatal Care Visit  Subjective  Paige Blanchard is a 23 y.o. G2P0010 at [redacted]w[redacted]d being seen today for ongoing prenatal care.  She is currently monitored for the following issues for this high-risk pregnancy and has Migraine; Class 3 severe obesity due to excess calories without serious comorbidity with body mass index (BMI) of 50.0 to 59.9 in adult [facility]); Recurrent major depressive disorder (HCC); Mastodynia; Peanut allergy; LGSIL on Pap smear of cervix; Supervision of high risk pregnancy, antepartum; and Abdominal pain complicating pregnancy on their problem list.  ----------------------------------------------------------------------------------- Patient reports bleeding.   Contractions: Not present. Vag. Bleeding: Scant.   . Denies leaking of fluid.  ----------------------------------------------------------------------------------- The following portions of the patient's history were reviewed and updated as appropriate: allergies, current medications, past family history, past medical history, past social history, past surgical history and problem list. Problem list updated.   Objective  Blood pressure 112/60, weight (!) 304 lb (137.9 kg), last menstrual period 12/15/2017. Pregravid weight 291 lb (132 kg) Total Weight Gain 13 lb (5.897 kg) Urinalysis:      Fetal Status: Fetal Heart Rate (bpm): 168         General:  Alert, oriented and cooperative. Patient is in no acute distress.  Skin: Skin is warm and dry. No rash noted.   Cardiovascular: Normal heart rate noted  Respiratory: Normal respiratory effort, no problems with respiration noted  Abdomen: Soft, gravid, appropriate for gestational age. Pain/Pressure: Present     Pelvic:  small brown discharge at cervix, less than 1 dossil        Extremities: Normal range of motion.     Mental Status: Normal mood and affect. Normal behavior. Normal judgment and thought content.   Bedside US showed IUP with good movement, FHR  168bpm.  Small subchorionic hemorrhage seen, less than 1cm in size.  Assessment   23 y.o. G2P0010 at [redacted]w[redacted]d by  09/21/2018, by Last Menstrual Period presenting for work-in prenatal visit  Plan   pregnancy Problems (from 01/26/18 to present)    Problem Noted Resolved   Abdominal pain complicating pregnancy 02/21/2018 by Raelyn Mora, CNM No   Supervision of high risk pregnancy, antepartum 02/02/2018 by Tresea Mall, CNM No   Overview Addendum 03/15/2018 10:16 AM by Natale Milch, MD    [facility]inic [facility] Prenatal Labs  Dating  6 wk US= LMP Blood type: A/Positive/-- (02/18 1513)   Genetic Screen  NIPS: normal XX Antibody:Negative (02/18 1513)  Anatomic [place]  Rubella: 4.91 (02/18 1513) Varicella: Immune  GTT Early:               Third trimester:  RPR: Non Reactive (02/18 1513)   Rhogam  HBsAg: Negative (02/18 1513)   TDaP vaccine                       Flu Shot: HIV: Non Reactive (02/18 1513)   Baby Food                                GBS:   Contraception  Pap: LSIL 04/2017, needs repeat 04/2018  CBB     CS/VBAC    Support Person               Gestational age appropriate obstetric precautions including but not limited to vaginal bleeding, contractions, leaking of fluid and fetal movement were reviewed in detail with the patient.    Small scant  brown at cervix, very minimal. Normal scores for depression and anxiety screens Spoke with Jola Babinski and felt better. Patient overwhelmed with trying to finish school and having a child.  RH +  No follow-ups on file.  Natale Milch MD [facility], [facility] 03/15/2018, 8:26 PM

## 2018-03-24 ENCOUNTER — Telehealth: Payer: Self-pay

## 2018-03-24 ENCOUNTER — Other Ambulatory Visit: Payer: Self-pay

## 2018-03-24 ENCOUNTER — Ambulatory Visit (INDEPENDENT_AMBULATORY_CARE_PROVIDER_SITE_OTHER): Payer: BLUE CROSS/BLUE SHIELD | Admitting: Certified Nurse Midwife

## 2018-03-24 VITALS — BP 112/60 | Wt 308.0 lb

## 2018-03-24 DIAGNOSIS — Z3A14 14 weeks gestation of pregnancy: Secondary | ICD-10-CM

## 2018-03-24 DIAGNOSIS — O209 Hemorrhage in early pregnancy, unspecified: Secondary | ICD-10-CM

## 2018-03-24 DIAGNOSIS — O0990 Supervision of high risk pregnancy, unspecified, unspecified trimester: Secondary | ICD-10-CM

## 2018-03-24 NOTE — Telephone Encounter (Signed)
Pt c/o 14w; extreme right side ovary pain; the pain makes her dizzy.  Has tried tylenol and warm bath - doesn't help.  [phone]  Tx'd to SP to schedule appt.

## 2018-03-26 DIAGNOSIS — O209 Hemorrhage in early pregnancy, unspecified: Secondary | ICD-10-CM | POA: Insufficient documentation

## 2018-03-26 NOTE — Progress Notes (Signed)
ROB at 14wk1d: Having some brown discharge. No frank bleeding since last visit. Unable to hear FHT with DT FHT 154 using ultrasound. 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  Farrel Conners, CNM

## 2018-04-04 ENCOUNTER — Other Ambulatory Visit: Payer: BLUE CROSS/BLUE SHIELD

## 2018-04-04 ENCOUNTER — Telehealth: Payer: Self-pay

## 2018-04-04 NOTE — Telephone Encounter (Signed)
Called pt to verify telephone appointment, pt states she is having pain pelvic pain  both sides, should she come in the office to be seen? Or you call and go from there?

## 2018-04-05 ENCOUNTER — Encounter: Payer: BLUE CROSS/BLUE SHIELD | Admitting: Obstetrics & Gynecology

## 2018-04-05 NOTE — Telephone Encounter (Signed)
Pt aware.

## 2018-04-06 ENCOUNTER — Telehealth: Payer: Self-pay

## 2018-04-06 NOTE — Telephone Encounter (Signed)
Pt called after hour nurse this morning c/o 17wks, cold sxs, ear pain, sore throat, h/a, decreased fetal movement after taking nighttime musinex.  She was adv by after hour nurse to go to L&D per Telephone Advice Record. Pt states she didn't not go to L&D.  She was told to quarantine herself.  Adv can take plain musinex/plain robitussin, plain sudafed, warm salt water gargles, sip hot beverages/soup, e.s. tylenol.

## 2018-04-11 ENCOUNTER — Encounter: Payer: BLUE CROSS/BLUE SHIELD | Admitting: Obstetrics & Gynecology

## 2018-04-22 ENCOUNTER — Inpatient Hospital Stay (HOSPITAL_COMMUNITY)
Admission: AD | Admit: 2018-04-22 | Discharge: 2018-04-23 | Disposition: A | Discharge status: Home / Self Care | Payer: BLUE CROSS/BLUE SHIELD | Source: Ambulatory Visit | Attending: Obstetrics | Admitting: Obstetrics

## 2018-04-22 ENCOUNTER — Other Ambulatory Visit: Payer: Self-pay

## 2018-04-22 DIAGNOSIS — O26892 Other specified pregnancy related conditions, second trimester: Secondary | ICD-10-CM | POA: Insufficient documentation

## 2018-04-22 DIAGNOSIS — R109 Unspecified abdominal pain: Secondary | ICD-10-CM

## 2018-04-22 DIAGNOSIS — K219 Gastro-esophageal reflux disease without esophagitis: Secondary | ICD-10-CM | POA: Insufficient documentation

## 2018-04-22 DIAGNOSIS — O99612 Diseases of the digestive system complicating pregnancy, second trimester: Secondary | ICD-10-CM | POA: Insufficient documentation

## 2018-04-22 DIAGNOSIS — Z3A18 18 weeks gestation of pregnancy: Secondary | ICD-10-CM | POA: Insufficient documentation

## 2018-04-22 DIAGNOSIS — R1010 Upper abdominal pain, unspecified: Secondary | ICD-10-CM | POA: Insufficient documentation

## 2018-04-22 DIAGNOSIS — O26899 Other specified pregnancy related conditions, unspecified trimester: Secondary | ICD-10-CM

## 2018-04-23 ENCOUNTER — Encounter (HOSPITAL_COMMUNITY): Payer: Self-pay | Admitting: *Deleted

## 2018-04-23 DIAGNOSIS — O26892 Other specified pregnancy related conditions, second trimester: Secondary | ICD-10-CM | POA: Diagnosis not present

## 2018-04-23 DIAGNOSIS — Z3A18 18 weeks gestation of pregnancy: Secondary | ICD-10-CM | POA: Diagnosis not present

## 2018-04-23 DIAGNOSIS — R109 Unspecified abdominal pain: Secondary | ICD-10-CM | POA: Diagnosis not present

## 2018-04-23 DIAGNOSIS — O99612 Diseases of the digestive system complicating pregnancy, second trimester: Secondary | ICD-10-CM | POA: Diagnosis not present

## 2018-04-23 DIAGNOSIS — K219 Gastro-esophageal reflux disease without esophagitis: Secondary | ICD-10-CM

## 2018-04-23 DIAGNOSIS — R1010 Upper abdominal pain, unspecified: Secondary | ICD-10-CM | POA: Diagnosis not present

## 2018-04-23 LAB — URINALYSIS, ROUTINE W REFLEX MICROSCOPIC
Bilirubin Urine: NEGATIVE
Glucose, UA: NEGATIVE mg/dL
Hgb urine dipstick: NEGATIVE
Ketones, ur: NEGATIVE mg/dL
Nitrite: NEGATIVE
Protein, ur: NEGATIVE mg/dL
Specific Gravity, Urine: 1.021 (ref 1.005–1.030)
pH: 6 (ref 5.0–8.0)

## 2018-04-23 LAB — CBC WITH DIFFERENTIAL/PLATELET
Abs Immature Granulocytes: 0.03 10*3/uL (ref 0.00–0.07)
Basophils Absolute: 0 10*3/uL (ref 0.0–0.1)
Basophils Relative: 0 %
Eosinophils Absolute: 0.1 10*3/uL (ref 0.0–0.5)
Eosinophils Relative: 1 %
HCT: 34.8 % — ABNORMAL LOW (ref 36.0–46.0)
Hemoglobin: 11.6 g/dL — ABNORMAL LOW (ref 12.0–15.0)
Immature Granulocytes: 0 %
Lymphocytes Relative: 23 %
Lymphs Abs: 2 10*3/uL (ref 0.7–4.0)
MCH: 31.4 pg (ref 26.0–34.0)
MCHC: 33.3 g/dL (ref 30.0–36.0)
MCV: 94.1 fL (ref 80.0–100.0)
Monocytes Absolute: 0.7 10*3/uL (ref 0.1–1.0)
Monocytes Relative: 9 %
Neutro Abs: 5.9 10*3/uL (ref 1.7–7.7)
Neutrophils Relative %: 67 %
Platelets: 192 10*3/uL (ref 150–400)
RBC: 3.7 MIL/uL — ABNORMAL LOW (ref 3.87–5.11)
RDW: 13.5 % (ref 11.5–15.5)
WBC: 8.7 10*3/uL (ref 4.0–10.5)
nRBC: 0 % (ref 0.0–0.2)

## 2018-04-23 LAB — COMPREHENSIVE METABOLIC PANEL
ALT: 41 U/L (ref 0–44)
AST: 28 U/L (ref 15–41)
Albumin: 2.9 g/dL — ABNORMAL LOW (ref 3.5–5.0)
Alkaline Phosphatase: 37 U/L — ABNORMAL LOW (ref 38–126)
Anion gap: 9 (ref 5–15)
BUN: 9 mg/dL (ref 6–20)
CO2: 22 mmol/L (ref 22–32)
Calcium: 9.2 mg/dL (ref 8.9–10.3)
Chloride: 106 mmol/L (ref 98–111)
Creatinine, Ser: 0.58 mg/dL (ref 0.44–1.00)
GFR calc Af Amer: >60 mL/min (ref >60)
GFR calc non Af Amer: >60 mL/min (ref >60)
Glucose, Bld: 93 mg/dL (ref 70–99)
Potassium: 3.7 mmol/L (ref 3.5–5.1)
Sodium: 137 mmol/L (ref 135–145)
Total Bilirubin: 0.3 mg/dL (ref 0.3–1.2)
Total Protein: 6.5 g/dL (ref 6.5–8.1)

## 2018-04-23 LAB — AMYLASE: Amylase: 78 U/L (ref 28–100)

## 2018-04-23 LAB — LIPASE, BLOOD: Lipase: 32 U/L (ref 11–51)

## 2018-04-23 MED ORDER — ALUM & MAG HYDROXIDE-SIMETH 200-200-20 MG/5ML PO SUSP
30.0000 mL | Freq: Once | ORAL | Status: AC
  Administered 2018-04-23: 30 mL via ORAL
  Filled 2018-04-23: qty 30

## 2018-04-23 MED ORDER — DICYCLOMINE HCL 10 MG/5ML PO SOLN
10.0000 mg | Freq: Once | ORAL | Status: AC
  Administered 2018-04-23: 01:00:00 10 mg via ORAL
  Filled 2018-04-23: qty 5

## 2018-04-23 MED ORDER — FAMOTIDINE 40 MG PO TABS: 40.0000 mg | ORAL_TABLET | Freq: Every day | ORAL | 1 refills | Status: DC

## 2018-04-23 NOTE — MAU Provider Note (Signed)
History     CSN: [id number]  Arrival date and time: 04/22/18 2340   First Provider Initiated Contact with Patient 04/23/18 0034      Chief Complaint  Patient presents with  . Abdominal Pain   Paige Blanchard is a 23 y.o. G2P0010 at 3941w3d who presents today with upper abdominal pain x 1 day. She denies any VB or LOF. She believes she has started to feel fetal movement. Next OB visit 05/04/2018.  Abdominal Pain  This is a new problem. The current episode started yesterday. The onset quality is sudden. The problem occurs constantly. The problem has been gradually improving. The pain is located in the periumbilical region and epigastric region. The pain is at a severity of 5/10. The quality of the pain is colicky and sharp. The abdominal pain radiates to the back. Pertinent negatives include no constipation, diarrhea, dysuria, fever, frequency, nausea or vomiting. Nothing aggravates the pain. Relieved by: hot shower  She has tried acetaminophen for the symptoms. The treatment provided no relief.    OB History    Gravida  2   Para  0   Term  0   Preterm  0   AB  1   Living  0     SAB  1   TAB  0   Ectopic  0   Multiple  0   Live Births  0           Past Medical History:  Diagnosis Date  . Dysmenorrhea   . Migraine with aura   . Vitamin D deficiency     Past Surgical History:  Procedure Laterality Date  . abcessed tooth  03/14/2017   drained in ED of [facility]  . HERNIA REPAIR     AGE 2  . TONSILLECTOMY AND ADENOIDECTOMY    . TUBES IN EAR    . WISDOM TOOTH EXTRACTION  01/2017    Family History  Problem Relation Age of Onset  . Diabetes Mother   . Hypertension Mother   . Heart disease Father   . Diabetes Maternal Grandmother        TYPE 2  . Diabetes Maternal Grandfather        TYPE 2  . Breast cancer Other   . Cancer Other        PROSTATE    Social History   Tobacco Use  . Smoking status: Never Smoker  . Smokeless tobacco: Never Used   Substance Use Topics  . Alcohol use: No    Frequency: Never  . Drug use: No    Allergies:  Allergies  Allergen Reactions  . Peanut Oil Hives    Medications Prior to Admission  Medication Sig Dispense Refill Last Dose  . acetaminophen (TYLENOL) 500 MG tablet Take 1,000 mg by mouth every 6 (six) hours as needed.   04/23/2018 at Unknown time  . Prenatal Vit-Fe Fumarate-FA (MULTIVITAMIN-PRENATAL) 27-0.8 MG TABS tablet Take 1 tablet by mouth daily at 12 noon.   04/23/2018 at Unknown time  . albuterol (PROVENTIL HFA) 108 (90 Base) MCG/ACT inhaler Inhale 2 puffs into the lungs as needed.   More than a month at Unknown time    Review of Systems  Constitutional: Negative for chills and fever.  Gastrointestinal: Positive for abdominal pain. Negative for constipation, diarrhea, nausea and vomiting.  Genitourinary: Negative for dysuria, frequency, pelvic pain, vaginal bleeding and vaginal discharge.   Physical Exam   Temperature 98.5 F (36.9 C), resp. rate 18, height 5'  4" (1.626 m), weight (!) 141.5 kg, last menstrual period 12/15/2017.  Physical Exam  Nursing note and vitals reviewed. Constitutional: She is oriented to person, place, and time. She appears well-developed and well-nourished. No distress.  HENT:  Head: Normocephalic.  Cardiovascular: Normal rate.  Respiratory: Effort normal.  GI: Soft. There is no abdominal tenderness. There is no rebound.  Neurological: She is alert and oriented to person, place, and time.  Skin: Skin is warm and dry.  Psychiatric: She has a normal mood and affect.   FHT 155 with doppler   Results for orders placed or performed during the hospital encounter of 04/22/18 (from the past 24 hour(s))  Urinalysis, Routine w reflex microscopic     Status: Abnormal   Collection Time: 04/23/18 12:18 AM  Result Value Ref Range   Color, Urine YELLOW YELLOW   APPearance HAZY (A) CLEAR   Specific Gravity, Urine 1.021 1.005 - 1.030   pH 6.0 5.0 - 8.0    Glucose, UA NEGATIVE NEGATIVE mg/dL   Hgb urine dipstick NEGATIVE NEGATIVE   Bilirubin Urine NEGATIVE NEGATIVE   Ketones, ur NEGATIVE NEGATIVE mg/dL   Protein, ur NEGATIVE NEGATIVE mg/dL   Nitrite NEGATIVE NEGATIVE   Leukocytes,Ua TRACE (A) NEGATIVE   RBC / HPF 0-5 0 - 5 RBC/hpf   WBC, UA 11-20 0 - 5 WBC/hpf   Bacteria, UA FEW (A) NONE SEEN   Squamous Epithelial / LPF 6-10 0 - 5   Mucus PRESENT   CBC with Differential/Platelet     Status: Abnormal   Collection Time: 04/23/18 12:37 AM  Result Value Ref Range   WBC 8.7 4.0 - 10.5 K/uL   RBC 3.70 (L) 3.87 - 5.11 MIL/uL   Hemoglobin 11.6 (L) 12.0 - 15.0 g/dL   HCT 54.0 (L) 98.1 - 19.1 %   MCV 94.1 80.0 - 100.0 fL   MCH 31.4 26.0 - 34.0 pg   MCHC 33.3 30.0 - 36.0 g/dL   RDW 47.8 29.5 - 62.1 %   Platelets 192 150 - 400 K/uL   nRBC 0.0 0.0 - 0.2 %   Neutrophils Relative % 67 %   Neutro Abs 5.9 1.7 - 7.7 K/uL   Lymphocytes Relative 23 %   Lymphs Abs 2.0 0.7 - 4.0 K/uL   Monocytes Relative 9 %   Monocytes Absolute 0.7 0.1 - 1.0 K/uL   Eosinophils Relative 1 %   Eosinophils Absolute 0.1 0.0 - 0.5 K/uL   Basophils Relative 0 %   Basophils Absolute 0.0 0.0 - 0.1 K/uL   Immature Granulocytes 0 %   Abs Immature Granulocytes 0.03 0.00 - 0.07 K/uL  Comprehensive metabolic panel     Status: Abnormal   Collection Time: 04/23/18 12:37 AM  Result Value Ref Range   Sodium 137 135 - 145 mmol/L   Potassium 3.7 3.5 - 5.1 mmol/L   Chloride 106 98 - 111 mmol/L   CO2 22 22 - 32 mmol/L   Glucose, Bld 93 70 - 99 mg/dL   BUN 9 6 - 20 mg/dL   Creatinine, Ser 3.08 0.44 - 1.00 mg/dL   Calcium 9.2 8.9 - 65.7 mg/dL   Total Protein 6.5 6.5 - 8.1 g/dL   Albumin 2.9 (L) 3.5 - 5.0 g/dL   AST 28 15 - 41 U/L   ALT 41 0 - 44 U/L   Alkaline Phosphatase 37 (L) 38 - 126 U/L   Total Bilirubin 0.3 0.3 - 1.2 mg/dL   GFR calc non Af Amer >60 >  60 mL/min   GFR calc Af Amer >60 >60 mL/min   Anion gap 9 5 - 15  Amylase     Status: None   Collection Time:  04/23/18 12:37 AM  Result Value Ref Range   Amylase 78 28 - 100 U/L  Lipase, blood     Status: None   Collection Time: 04/23/18 12:37 AM  Result Value Ref Range   Lipase 32 11 - 51 U/L    MAU Course  Procedures  MDM Patient given GI cocktail. She reports that she is feeling better.   Assessment and Plan   1. Gastroesophageal reflux disease, esophagitis presence not specified   2. Abdominal pain complicating pregnancy   3. [redacted] weeks gestation of pregnancy    DC home Comfort measures reviewed  2nd/3rd Trimester precautions  PTL precautions  Fetal kick counts RX: pepcid 40mg  QD #30 Return to MAU as needed FU with OB as planned  Follow-up Information    Ob/Gyn, [facility] Follow up.   Contact information: [address] [place] [address] [phone]          Thressa Sheller DNP, CNM  04/23/18  1:48 AM

## 2018-04-23 NOTE — MAU Note (Signed)
Having pain in upper to mid abdomen yesterday morning. Took Tylenol and not helping. Tried heating pad and hot showers and not helping. Pain feels like a "charlie horse" at top of stomach

## 2018-04-23 NOTE — Discharge Instructions (Signed)
Social Distancing FAQ: How It Helps Prevent COVID-19 (Coronavirus) and Steps We Can Take to Protect Ourselves There are many things we can do to prevent the spread of COVID-19 (coronavirus): washing our hands, coughing into our elbows, avoiding touching our faces, staying home if we're feeling sick and social distancing. But what is social distancing? These frequently asked questions explain how to practice social distancing in order to protect yourself, your loved ones and your community. General Information About Social Distancing Q: What is social distancing? A: Social distancing is the practice of purposefully reducing close contact between people. According to the CDC, social distancing means: . Remaining out of "congregate settings" as much as possible. . Avoiding mass gatherings. . Maintaining distance of about 6 feet from others when possible. Q: Why is social distancing important? A: Social distancing is crucial for preventing the spread of contagious illnesses such as COVID-19 (coronavirus). COVID-19 can spread through coughing, sneezing and close contact. By minimizing the amount of close contact we have with others, we reduce our chances of catching the virus and spreading it to our loved ones and within our community. Q: Who is social distancing important for? A: Social distancing is important for all of us, but those of us who are at higher risk of serious complications caused by COVID-19 should be especially cautious about social distancing. People who are at high risk of complications include: . Older adults. . People who have serious chronic medical conditions like heart disease, diabetes and lung disease. Q: What is "flattening the curve"? What does it have to do with social distancing? A: "Flattening the curve" refers to reducing the number of people who are sick at one time. If there are high surges in the number of COVID-19 cases all at once, health care systems and resources  could potentially become overwhelmed. Efforts that help stop COVID-19 from spreading rapidly - like social distancing - help keep the number of people who are sick at one time as low as possible. Q: When should I start practicing social distancing? A: The best time to begin social distancing is before an illness like COVID-19 becomes widespread throughout your community. Each community's situation is unique, so it is important to follow the guidance of local government, health departments and health care providers. Currently in [place], gatherings of more than 10 people are banned. For the latest information on COVID-19 policies in [place], visit the [place] Department of Health and Human Services website and view news releases and executive orders. The advice below is applicable if you are symptom-free and have reasonable confidence that you have not had exposure to COVID-19. Always follow the guidance of local government, health departments and your health care providers. Visiting Public Spaces Q: How can I practice social distancing in the workplace? A: When possible, keeping about 6 feet of distance between yourself and others is key. It's also important to practice other preventative measures such as washing hands, avoiding touching your face, coughing into your elbow and staying home if you feel sick. Depending on your job and your community's situation, working from home may be an option. Always follow local guidance. Q: How can my child practice social distancing at school or at college?  A: Many schools and universities in the [place] have postponed in-person classes; currently in [place], K-12 schools have been closed until May 15 (this is subject to change). It's important to follow local guidance, as each community's situation is unique. It's important for people   of all ages to follow preventative measures, including staying about 6 feet away from others, avoiding  touching your face, washing your hands and coughing into your elbow. Q: Should I be concerned about going to the grocery store? A: In any place where large numbers of people gather, there is potential risk for disease transmission. When you visit the grocery store, keep about 6 feet between yourself and others and use prevention techniques like avoiding touching your face and washing your hands. If possible, visit the store at times when there are likely to be fewer people shopping. Q: Should I take public transportation? A: If you have the option, driving yourself, walking to work or working from home can help reduce the number of people who are using public transportation, which benefits you and your community. In any of these situations, it's very important to keep distance between yourself and others in addition to practicing other preventative measures. Q: Should I stop visiting restaurants and bars? A: Currently in [place], restaurants and bars have been closed until further notice. Always follow local guidance. Avoiding public places as much as possible helps prevent diseases from spreading. If dining out is a non-essential activity, then it is generally in the best interest of you and your loved ones to avoid it. Q: Can I still go to the gym? A: Currently in [place], gyms have been closed until further notice. Always follow local guidance. Alternatives could include exercising in your home or yard or walking through your neighborhood. If you do go to the gym, wipe down and sanitize your equipment, keep distance between yourself and others, avoid touching other people and practice other preventative techniques.  Q: What about events and places where many people gather, such as concerts, festivals, sporting events and churches? A: Risk of disease transmission is much higher in large groups of people. Effective March 25 at 5 PM in [place], meetings of over 10 people are prohibited  in order to prevent COVID-19 from spreading rapidly. Avoiding these gatherings are generally in the best interest of protecting yourself, your loved ones and your community. Always follow local guidance. Meeting with Others Q: Should I stop visiting my elderly relatives and friends?  A: Older adults are at high risk of serious complications from COVID-19. Limiting their exposure as much as possible to those who may be sick or who may be carrying the disease is crucial. This is a great opportunity to try other methods of connecting, such as over the phone or through a video chat. Q: What about social distancing with other people in my household? A: Avoiding close contact within a household is almost impossible, and social distancing is mainly focused on large groups. However, if someone in your household is sick, it's important to minimize close contact with them as much as is reasonable. Q: Should I stop meeting up with 1 or 2 friends? Should I stop dating? A: While meeting up with another person who also is symptom-free may be alright in some situations, keep in mind that risk of disease transmission is higher in public places. Now may be a good time to consider other methods of connecting with others, such as over the phone or through a video chat. Q: Can I have a small group of my extended family and/or friends over to my house? A: If possible, it's best to postpone these kinds of gatherings and look for alternative ways to connect. Avoiding non-essential gatherings is important for preventing the spread   of disease. Q: Should my family and friends cancel big gathering events like weddings and birthday parties? A: Effective March 25 at 5 PM in [place], meetings of over 10 people are prohibited in order to prevent COVID-19 from spreading rapidly. Always follow local guidance. While it's difficult to postpone important events like these, it's also very important to protect our loved ones -  especially our loved ones who are most vulnerable. It may be best to postpone or alter your plans.  Q: If I'm avoiding in-person gatherings, how can I stay connected to others? A: There are many ways you can connect with friends: phone calls, text messages, emails and video chats are all great virtual options. While physical social distancing is important for our health, so is social interaction - trying alternative ways to stay connected is a good way to take care of your emotional health. If You Are Experiencing Symptoms Q: How should I approach social distancing if I start to feel sick? A: If you begin to experience symptoms, it's important to stay home and distance yourself from others. Always follow the guidance of your health care providers and local government. Q: Can I have visitors while I am in quarantine? A: Having visitors should be avoided as much as possible. Quarantining is an important way to protect your loved ones and community from picking up the disease; visitors are at high risk of catching the illness from you. Q: Can I go outside in my yard if I am in quarantine? A: Depending on where you live and your community's situation, going outside and getting some fresh air in your yard may be safe. Always follow the guidance of your health care providers and local government.   

## 2018-04-26 ENCOUNTER — Encounter (HOSPITAL_COMMUNITY): Payer: Self-pay | Admitting: *Deleted

## 2018-04-26 ENCOUNTER — Inpatient Hospital Stay (HOSPITAL_COMMUNITY)
Admission: AD | Admit: 2018-04-26 | Discharge: 2018-04-26 | Disposition: A | Discharge status: Home / Self Care | Payer: BLUE CROSS/BLUE SHIELD | Attending: Obstetrics and Gynecology | Admitting: Obstetrics and Gynecology

## 2018-04-26 ENCOUNTER — Other Ambulatory Visit: Payer: Self-pay

## 2018-04-26 DIAGNOSIS — B373 Candidiasis of vulva and vagina: Secondary | ICD-10-CM | POA: Diagnosis not present

## 2018-04-26 DIAGNOSIS — B372 Candidiasis of skin and nail: Secondary | ICD-10-CM | POA: Diagnosis not present

## 2018-04-26 DIAGNOSIS — O0992 Supervision of high risk pregnancy, unspecified, second trimester: Secondary | ICD-10-CM

## 2018-04-26 DIAGNOSIS — R109 Unspecified abdominal pain: Secondary | ICD-10-CM

## 2018-04-26 DIAGNOSIS — O98812 Other maternal infectious and parasitic diseases complicating pregnancy, second trimester: Secondary | ICD-10-CM

## 2018-04-26 DIAGNOSIS — O26893 Other specified pregnancy related conditions, third trimester: Secondary | ICD-10-CM | POA: Insufficient documentation

## 2018-04-26 DIAGNOSIS — O26899 Other specified pregnancy related conditions, unspecified trimester: Secondary | ICD-10-CM

## 2018-04-26 DIAGNOSIS — Z3A18 18 weeks gestation of pregnancy: Secondary | ICD-10-CM | POA: Diagnosis not present

## 2018-04-26 DIAGNOSIS — O368120 Decreased fetal movements, second trimester, not applicable or unspecified: Secondary | ICD-10-CM | POA: Diagnosis not present

## 2018-04-26 DIAGNOSIS — O26892 Other specified pregnancy related conditions, second trimester: Secondary | ICD-10-CM

## 2018-04-26 DIAGNOSIS — N898 Other specified noninflammatory disorders of vagina: Secondary | ICD-10-CM

## 2018-04-26 DIAGNOSIS — O0990 Supervision of high risk pregnancy, unspecified, unspecified trimester: Secondary | ICD-10-CM

## 2018-04-26 DIAGNOSIS — O99210 Obesity complicating pregnancy, unspecified trimester: Secondary | ICD-10-CM

## 2018-04-26 MED ORDER — TERCONAZOLE 0.8 % VA CREA: 1.0000 | TOPICAL_CREAM | Freq: Every day | VAGINAL | 0 refills | Status: DC

## 2018-04-26 NOTE — Discharge Instructions (Signed)
Vaginal Yeast infection, Adult    Vaginal yeast infection is a condition that causes vaginal discharge as well as soreness, swelling, and redness (inflammation) of the vagina. This is a common condition. Some women get this infection frequently.  What are the causes?  This condition is caused by a change in the normal balance of the yeast (candida) and bacteria that live in the vagina. This change causes an overgrowth of yeast, which causes the inflammation.  What increases the risk?  The condition is more likely to develop in women who:   Take antibiotic medicines.   Have diabetes.   Take birth control pills.   Are pregnant.   Douche often.   Have a weak body defense system (immune system).   Have been taking steroid medicines for a long time.   Frequently wear tight clothing.  What are the signs or symptoms?  Symptoms of this condition include:   White, thick, creamy vaginal discharge.   Swelling, itching, redness, and irritation of the vagina. The lips of the vagina (vulva) may be affected as well.   Pain or a burning feeling while urinating.   Pain during sex.  How is this diagnosed?  This condition is diagnosed based on:   Your medical history.   A physical exam.   A pelvic exam. Your health care provider will examine a sample of your vaginal discharge under a microscope. Your health care provider may send this sample for testing to confirm the diagnosis.  How is this treated?  This condition is treated with medicine. Medicines may be over-the-counter or prescription. You may be told to use one or more of the following:   Medicine that is taken by mouth (orally).   Medicine that is applied as a cream (topically).   Medicine that is inserted directly into the vagina (suppository).  Follow these instructions at home:    Lifestyle   Do not have sex until your health care provider approves. Tell your sex partner that you have a yeast infection. That person should go to his or her health care  provider and ask if they should also be treated.   Do not wear tight clothes, such as pantyhose or tight pants.   Wear breathable cotton underwear.  General instructions   Take or apply over-the-counter and prescription medicines only as told by your health care provider.   Eat more yogurt. This may help to keep your yeast infection from returning.   Do not use tampons until your health care provider approves.   Try taking a sitz bath to help with discomfort. This is a warm water bath that is taken while you are sitting down. The water should only come up to your hips and should cover your buttocks. Do this 3-4 times per day or as told by your health care provider.   Do not douche.   If you have diabetes, keep your blood sugar levels under control.   Keep all follow-up visits as told by your health care provider. This is important.  Contact a health care provider if:   You have a fever.   Your symptoms go away and then return.   Your symptoms do not get better with treatment.   Your symptoms get worse.   You have new symptoms.   You develop blisters in or around your vagina.   You have blood coming from your vagina and it is not your menstrual period.   You develop pain in your abdomen.  Summary     Vaginal yeast infection is a condition that causes discharge as well as soreness, swelling, and redness (inflammation) of the vagina.   This condition is treated with medicine. Medicines may be over-the-counter or prescription.   Take or apply over-the-counter and prescription medicines only as told by your health care provider.   Do not douche. Do not have sex or use tampons until your health care provider approves.   Contact a health care provider if your symptoms do not get better with treatment or your symptoms go away and then return.  This information is not intended to replace advice given to you by your health care provider. Make sure you discuss any questions you have with your health care  provider.  Document Released: 10/01/2004 Document Revised: 05/10/2017 Document Reviewed: 05/10/2017  Elsevier Interactive Patient Education  2019 [employer]

## 2018-04-26 NOTE — MAU Provider Note (Signed)
History     CSN: [id number]  Arrival date and time: 04/26/18 [id number]   First Provider Initiated Contact with Patient 04/26/18 2015      Chief Complaint  Patient presents with  . Decreased Fetal Movement  . Vaginal Discharge   HPI  Ms. Paige Blanchard is a 23 y.o. G2P0010 at 9067w6d who presents to MAU today with complaint of decreased fetal movement and vaginal irritation. The patient states that she has been feeling flutters for the last few days, but not as much today. She denies vaginal bleeding or abdominal pain. She states vaginal itching and irritation x 1 week. She has been scratching a lot and saw a spot of blood today because of that. She denies abnormal discharge.   OB History    Gravida  2   Para  0   Term  0   Preterm  0   AB  1   Living  0     SAB  1   TAB  0   Ectopic  0   Multiple  0   Live Births  0           Past Medical History:  Diagnosis Date  . Dysmenorrhea   . Migraine with aura   . Vitamin D deficiency     Past Surgical History:  Procedure Laterality Date  . abcessed tooth  03/14/2017   drained in ED of [facility]  . HERNIA REPAIR     AGE 75  . TONSILLECTOMY AND ADENOIDECTOMY    . TUBES IN EAR    . WISDOM TOOTH EXTRACTION  01/2017    Family History  Problem Relation Age of Onset  . Diabetes Mother   . Hypertension Mother   . Heart disease Father   . Diabetes Maternal Grandmother        TYPE 2  . Diabetes Maternal Grandfather        TYPE 2  . Breast cancer Other   . Cancer Other        PROSTATE    Social History   Tobacco Use  . Smoking status: Never Smoker  . Smokeless tobacco: Never Used  Substance Use Topics  . Alcohol use: No    Frequency: Never  . Drug use: No    Allergies:  Allergies  Allergen Reactions  . Peanut Oil Hives    Medications Prior to Admission  Medication Sig Dispense Refill Last Dose  . acetaminophen (TYLENOL) 500 MG tablet Take 1,000 mg by mouth every 6 (six) hours as needed.   Past Month  at Unknown time  . famotidine (PEPCID) 40 MG tablet Take 1 tablet (40 mg total) by mouth daily. 30 tablet 1 Past Week at Unknown time  . Prenatal Vit-Fe Fumarate-FA (MULTIVITAMIN-PRENATAL) 27-0.8 MG TABS tablet Take 1 tablet by mouth daily at 12 noon.   04/26/2018 at Unknown time  . albuterol (PROVENTIL HFA) 108 (90 Base) MCG/ACT inhaler Inhale 2 puffs into the lungs as needed.   More than a month at Unknown time    Review of Systems  Constitutional: Negative for fever.  Gastrointestinal: Negative for abdominal pain.  Genitourinary: Positive for vaginal pain. Negative for vaginal bleeding and vaginal discharge.   Physical Exam   Last menstrual period 12/15/2017.  Physical Exam  Nursing note and vitals reviewed. Constitutional: She is oriented to person, place, and time. She appears well-developed and well-nourished. No distress.  HENT:  Head: Normocephalic and atraumatic.  Cardiovascular: Normal rate.  Respiratory: Effort normal.  GI: Soft. She exhibits no distension. There is no abdominal tenderness.  Genitourinary:    There is rash on the right labia. There is no lesion on the right labia. There is rash on the left labia. There is no lesion on the left labia.    No vaginal discharge or bleeding.  No bleeding in the vagina.  Neurological: She is alert and oriented to person, place, and time.  Skin: Skin is warm and dry. No erythema.  Psychiatric: She has a normal mood and affect.    MAU Course  Procedures None  MDM FHR - 155 with doppler   Assessment and Plan  A: SIUP at [redacted]w[redacted]d Vaginal yeast infection   P:  Discharge home Rx for Terazole given to patient  Second trimester precautions discussed Patient advised to follow-up with [facility] tomorrow as planned. Patient encouraged to avoid scratching tonight and have MD re-evaluate vulvar area tomorrow Patient may return to MAU as needed or if her condition were to change or worsen  Vonzella Nipple, PA-C  04/26/2018, 8:15 PM

## 2018-04-26 NOTE — MAU Note (Signed)
PT SAYS HAS VAG REDDNESS AND IRRITATION - STARTED ON Monday-  NO OTC  CREAMS .    SAYS DECREASED FM -  IN TRIAGE - FHR - 155.

## 2018-06-04 ENCOUNTER — Other Ambulatory Visit: Payer: Self-pay

## 2018-06-04 ENCOUNTER — Inpatient Hospital Stay (HOSPITAL_COMMUNITY)
Admission: AD | Admit: 2018-06-04 | Discharge: 2018-06-04 | Disposition: A | Discharge status: Home / Self Care | Payer: BLUE CROSS/BLUE SHIELD | Attending: Obstetrics | Admitting: Obstetrics

## 2018-06-04 ENCOUNTER — Encounter (HOSPITAL_COMMUNITY): Payer: Self-pay

## 2018-06-04 DIAGNOSIS — Z3A24 24 weeks gestation of pregnancy: Secondary | ICD-10-CM | POA: Diagnosis not present

## 2018-06-04 DIAGNOSIS — O26892 Other specified pregnancy related conditions, second trimester: Secondary | ICD-10-CM | POA: Diagnosis present

## 2018-06-04 DIAGNOSIS — R112 Nausea with vomiting, unspecified: Secondary | ICD-10-CM | POA: Diagnosis not present

## 2018-06-04 DIAGNOSIS — R55 Syncope and collapse: Secondary | ICD-10-CM

## 2018-06-04 LAB — URINALYSIS, ROUTINE W REFLEX MICROSCOPIC
Bilirubin Urine: NEGATIVE
Glucose, UA: NEGATIVE mg/dL
Hgb urine dipstick: NEGATIVE
Ketones, ur: NEGATIVE mg/dL
Leukocytes,Ua: NEGATIVE
Nitrite: NEGATIVE
Protein, ur: NEGATIVE mg/dL
Specific Gravity, Urine: 1.018 (ref 1.005–1.030)
pH: 6 (ref 5.0–8.0)

## 2018-06-04 LAB — COMPREHENSIVE METABOLIC PANEL
ALT: 24 U/L (ref 0–44)
AST: 23 U/L (ref 15–41)
Albumin: 3.1 g/dL — ABNORMAL LOW (ref 3.5–5.0)
Alkaline Phosphatase: 44 U/L (ref 38–126)
Anion gap: 8 (ref 5–15)
BUN: 7 mg/dL (ref 6–20)
CO2: 23 mmol/L (ref 22–32)
Calcium: 9.6 mg/dL (ref 8.9–10.3)
Chloride: 106 mmol/L (ref 98–111)
Creatinine, Ser: 0.63 mg/dL (ref 0.44–1.00)
GFR calc Af Amer: >60 mL/min (ref >60)
GFR calc non Af Amer: >60 mL/min (ref >60)
Glucose, Bld: 85 mg/dL (ref 70–99)
Potassium: 4.2 mmol/L (ref 3.5–5.1)
Sodium: 137 mmol/L (ref 135–145)
Total Bilirubin: 0.5 mg/dL (ref 0.3–1.2)
Total Protein: 6.9 g/dL (ref 6.5–8.1)

## 2018-06-04 LAB — CBC
HCT: 35.5 % — ABNORMAL LOW (ref 36.0–46.0)
Hemoglobin: 12.3 g/dL (ref 12.0–15.0)
MCH: 32.8 pg (ref 26.0–34.0)
MCHC: 34.6 g/dL (ref 30.0–36.0)
MCV: 94.7 fL (ref 80.0–100.0)
Platelets: 203 10*3/uL (ref 150–400)
RBC: 3.75 MIL/uL — ABNORMAL LOW (ref 3.87–5.11)
RDW: 14.1 % (ref 11.5–15.5)
WBC: 7.7 10*3/uL (ref 4.0–10.5)
nRBC: 0 % (ref 0.0–0.2)

## 2018-06-04 LAB — GLUCOSE, CAPILLARY: Glucose-Capillary: 69 mg/dL — ABNORMAL LOW (ref 70–99)

## 2018-06-04 NOTE — MAU Provider Note (Signed)
Chief Complaint:  Nausea; Dizziness; and Loss of Consciousness   First Provider Initiated Contact with Patient 06/04/18 1046     HPI: Paige Blanchard is a 23 y.o. G2P0010 at 8333w3d who presents to maternity admissions reporting n/v, dizziness, & syncopal episode.  Symptoms started this morning at 7 am while she was at work. States she had 3 chicken biscuits & drank a glass of orange juice. When she got to work she felt nauseated & "foggy". She then vomited 5 times. Went back to her desk & passed out. Immediately regained consciousness. Since then has had some vaginal pain.  Denies abdominal pain, LOF, or vaginal bleeding. Denies headache, chest pain, or SOB. Denies hx of diabetes.    Past Medical History:  Diagnosis Date  . Dysmenorrhea   . Migraine with aura   . Vitamin D deficiency    OB History  Gravida Para Term Preterm AB Living  2 0 0 0 1 0  SAB TAB Ectopic Multiple Live Births  1 0 0 0 0    # Outcome Date GA Lbr Len/2nd Weight Sex Delivery Anes PTL Lv  2 Current           1 SAB            Past Surgical History:  Procedure Laterality Date  . abcessed tooth  03/14/2017   drained in ED of [facility]  . HERNIA REPAIR     AGE 64  . TONSILLECTOMY AND ADENOIDECTOMY    . TUBES IN EAR    . WISDOM TOOTH EXTRACTION  01/2017   Family History  Problem Relation Age of Onset  . Diabetes Mother   . Hypertension Mother   . Heart disease Father   . Diabetes Maternal Grandmother        TYPE 2  . Diabetes Maternal Grandfather        TYPE 2  . Breast cancer Other   . Cancer Other        PROSTATE   Social History   Tobacco Use  . Smoking status: Never Smoker  . Smokeless tobacco: Never Used  Substance Use Topics  . Alcohol use: No    Frequency: Never  . Drug use: No   Allergies  Allergen Reactions  . Peanut Oil Hives   No medications prior to admission.    I have reviewed patient's Past Medical Hx, Surgical Hx, Family Hx, Social Hx, medications and allergies.   ROS:   Review of Systems  Constitutional: Negative.   Respiratory: Negative.   Cardiovascular: Negative.   Gastrointestinal: Positive for nausea and vomiting. Negative for abdominal pain, constipation and diarrhea.  Genitourinary: Positive for vaginal pain. Negative for dysuria, vaginal bleeding and vaginal discharge.  Neurological: Positive for syncope and light-headedness. Negative for headaches.    Physical Exam   Patient Vitals for the past 24 hrs:  BP Temp Temp src Pulse Resp SpO2 Height Weight  06/04/18 1226 113/61 - - (!) 108 - - - -  06/04/18 0932 110/64 98.2 F (36.8 C) Oral (!) 105 18 98 % - -  06/04/18 [id number] - - - - - - 5\' 4"  (1.626 m) (!) 144 kg    Constitutional: Well-developed, well-nourished female in no acute distress.  Cardiovascular: normal rate & rhythm, no murmur Respiratory: normal effort, lung sounds clear throughout GI: Abd soft, non-tender, gravid appropriate for gestational age. Pos BS x 4 MS: Extremities nontender, no edema, normal ROM Neurologic: Alert and oriented x 4.  GU:  Pelvic: NEFG, physiologic discharge, no blood, cervix clean.   Dilation: Closed Effacement (%): Thick Exam by:: Judeth Horn, NP  NST:  Baseline: 150 bpm, Variability: Good {> 6 bpm), Accelerations: Non-reactive but appropriate for gestational age and Decelerations: Absent   Labs: Results for orders placed or performed during the hospital encounter of 06/04/18 (from the past 24 hour(s))  Urinalysis, Routine w reflex microscopic     Status: Abnormal   Collection Time: 06/04/18  9:42 AM  Result Value Ref Range   Color, Urine YELLOW YELLOW   APPearance HAZY (A) CLEAR   Specific Gravity, Urine 1.018 1.005 - 1.030   pH 6.0 5.0 - 8.0   Glucose, UA NEGATIVE NEGATIVE mg/dL   Hgb urine dipstick NEGATIVE NEGATIVE   Bilirubin Urine NEGATIVE NEGATIVE   Ketones, ur NEGATIVE NEGATIVE mg/dL   Protein, ur NEGATIVE NEGATIVE mg/dL   Nitrite NEGATIVE NEGATIVE   Leukocytes,Ua NEGATIVE  NEGATIVE  Glucose, capillary     Status: Abnormal   Collection Time: 06/04/18 10:46 AM  Result Value Ref Range   Glucose-Capillary 69 (L) 70 - 99 mg/dL  CBC     Status: Abnormal   Collection Time: 06/04/18 11:28 AM  Result Value Ref Range   WBC 7.7 4.0 - 10.5 K/uL   RBC 3.75 (L) 3.87 - 5.11 MIL/uL   Hemoglobin 12.3 12.0 - 15.0 g/dL   HCT 33.3 (L) 83.2 - 91.9 %   MCV 94.7 80.0 - 100.0 fL   MCH 32.8 26.0 - 34.0 pg   MCHC 34.6 30.0 - 36.0 g/dL   RDW 16.6 06.0 - 04.5 %   Platelets 203 150 - 400 K/uL   nRBC 0.0 0.0 - 0.2 %  Comprehensive metabolic panel     Status: Abnormal   Collection Time: 06/04/18 11:28 AM  Result Value Ref Range   Sodium 137 135 - 145 mmol/L   Potassium 4.2 3.5 - 5.1 mmol/L   Chloride 106 98 - 111 mmol/L   CO2 23 22 - 32 mmol/L   Glucose, Bld 85 70 - 99 mg/dL   BUN 7 6 - 20 mg/dL   Creatinine, Ser 9.97 0.44 - 1.00 mg/dL   Calcium 9.6 8.9 - 74.1 mg/dL   Total Protein 6.9 6.5 - 8.1 g/dL   Albumin 3.1 (L) 3.5 - 5.0 g/dL   AST 23 15 - 41 U/L   ALT 24 0 - 44 U/L   Alkaline Phosphatase 44 38 - 126 U/L   Total Bilirubin 0.5 0.3 - 1.2 mg/dL   GFR calc non Af Amer >60 >60 mL/min   GFR calc Af Amer >60 >60 mL/min   Anion gap 8 5 - 15    Imaging:  No results found.  MAU Course: Orders Placed This Encounter  Procedures  . Urinalysis, Routine w reflex microscopic  . Glucose, capillary  . CBC  . Comprehensive metabolic panel  . ED EKG  . Discharge patient   No orders of the defined types were placed in this encounter.   MDM: Pt had syncopal episode after vomiting 5 times. Slightly tachycardic but otherwise vitals normal.  EKG sinus tachycardia  CBG 69. Pt given crackers  Fetal tracing appropriate for gestation. No ctx on monitor & cervix closed/thick  CBC & CMP   Assessment: 1. Vasovagal syncope   2. [redacted] weeks gestation of pregnancy     Plan: Discharge home in stable condition.  Discussed reasons to return to MAU Discussed appropriate eating  habits during pregnancy  Follow-up Information  Ob/Gyn, [facility] Follow up.   Contact information: [address] [place] [address] [phone]           Allergies as of 06/04/2018      Reactions   Peanut Oil Hives      Medication List    STOP taking these medications   terconazole 0.8 % vaginal cream Commonly known as:  Terazol 3     TAKE these medications   acetaminophen 500 MG tablet Commonly known as:  TYLENOL Take 1,000 mg by mouth every 6 (six) hours as needed.   famotidine 40 MG tablet Commonly known as:  PEPCID Take 1 tablet (40 mg total) by mouth daily.   multivitamin-prenatal 27-0.8 MG Tabs tablet Take 1 tablet by mouth daily at 12 noon.   Proventil HFA 108 (90 Base) MCG/ACT inhaler Generic drug:  albuterol Inhale 2 puffs into the lungs as needed.       Judeth Horn, NP 06/04/2018 2:12 PM

## 2018-06-04 NOTE — Discharge Instructions (Signed)
Warning Signs During Pregnancy °A pregnancy lasts about 40 weeks, starting from the first day of your last period until the baby is born. Pregnancy is divided into three phases called trimesters. °· The first trimester refers to week 1 through week 13 of pregnancy. °· The second trimester is the start of week 14 through the end of week 27. °· The third trimester is the start of week 28 until you deliver your baby. °During each trimester of pregnancy, certain signs and symptoms may indicate a problem. Talk with your health care provider about your current health and any medical conditions you have. Make sure you know the symptoms that you should watch for and report. °How does this affect me? ° °Warning signs in the first trimester °While some changes during the first trimester may be uncomfortable, most do not represent a serious problem. Let your health care provider know if you have any of the following warning signs in the first trimester: °· You cannot eat or drink without vomiting, and this lasts for longer than a day. °· You have vaginal bleeding or spotting along with menstrual-like cramping. °· You have diarrhea for longer than a day. °· You have a fever or other signs of infection, such as: °? Pain or burning when you urinate. °? Foul smelling or thick or yellowish vaginal discharge. °Warning signs in the second trimester °As your baby grows and changes during the second trimester, there are additional signs and symptoms that may indicate a problem. These include: °· Signs and symptoms of infection, including a fever. °· Signs or symptoms of a miscarriage or preterm labor, such as regular contractions, menstrual-like cramping, or lower abdominal pain. °· Bloody or watery vaginal discharge or obvious vaginal bleeding. °· Feeling like your heart is pounding. °· Having trouble breathing. °· Nausea, vomiting, or diarrhea that lasts for longer than a day. °· Craving non-food items, such as clay, chalk, or dirt.  This may be a sign of a very treatable medical condition called pica. °Later in your second trimester, watch for signs and symptoms of a serious medical condition called preeclampsia.These include: °· Changes in your vision. °· A severe headache that does not go away. °· Nausea and vomiting. °It is also important to notice if your baby stops moving or moves less than usual during this time. °Warning signs in the third trimester °As you approach the third trimester, your baby is growing and your body is preparing for the birth of your baby. In your third trimester, be sure to let your health care provider know if: °· You have signs and symptoms of infection, including a fever. °· You have vaginal bleeding. °· You notice that your baby is moving less than usual or is not moving. °· You have nausea, vomiting, or diarrhea that lasts for longer than a day. °· You have a severe headache that does not go away. °· You have vision changes, including seeing spots or having blurry or double vision. °· You have increased swelling in your hands or face. °How does this affect my baby? °Throughout your pregnancy, always report any of the warning signs of a problem to your health care provider. This can help prevent complications that may affect your baby, including: °· Increased risk for premature birth. °· Infection that may be transmitted to your baby. °· Increased risk for stillbirth. °Contact a health care provider if: °· You have any of the warning signs of a problem for the current trimester of your pregnancy. °·   Any of the following apply to you during any trimester of pregnancy: ? You have strong emotions, such as sadness or anxiety, that interfere with work or personal relationships. ? You feel unsafe in your home and need help finding a safe place to live. ? You are using tobacco products, alcohol, or drugs and you need help to stop. Get help right away if: You have signs or symptoms of labor before 37 weeks of  pregnancy. These include:  Contractions that are 5 minutes or less apart, or that increase in frequency, intensity, or length.  Sudden, sharp abdominal pain or low back pain.  Uncontrolled gush or trickle of fluid from your vagina. Summary  A pregnancy lasts about 40 weeks, starting from the first day of your last period until the baby is born. Pregnancy is divided into three phases called trimesters. Each trimester has warning signs to watch for.  Always report any warning signs to your health care provider in order to prevent complications that may affect both you and your baby.  Talk with your health care provider about your current health and any medical conditions you have. Make sure you know the symptoms that you should watch for and report. This information is not intended to replace advice given to you by your health care provider. Make sure you discuss any questions you have with your health care provider. Document Released: 10/08/2016 Document Revised: 10/08/2016 Document Reviewed: 10/08/2016 Elsevier Interactive Patient Education  2019 [employer]    Eating Plan for Pregnant Women While you are pregnant, your body requires additional nutrition to help support your growing baby. You also have a higher need for some vitamins and minerals, such as folic acid, calcium, iron, and vitamin D. Eating a healthy, well-balanced diet is very important for your health and your baby's health. Your need for extra calories varies for the three 8510-month segments of your pregnancy (trimesters). For most women, it is recommended to consume:  150 extra calories a day during the first trimester.  300 extra calories a day during the second trimester.  300 extra calories a day during the third trimester. What are tips for following this plan?   Do not try to lose weight or go on a diet during pregnancy.  Limit your overall intake of foods that have "empty calories." These are foods that have  little nutritional value, such as sweets, desserts, candies, and sugar-sweetened beverages.  Eat a variety of foods (especially fruits and vegetables) to get a full range of vitamins and minerals.  Take a prenatal vitamin to help meet your additional vitamin and mineral needs during pregnancy, specifically for folic acid, iron, calcium, and vitamin D.  Remember to stay active. Ask your health care provider what types of exercise and activities are safe for you.  Practice good food safety and cleanliness. Wash your hands before you eat and after you prepare raw meat. Wash all fruits and vegetables well before peeling or eating. Taking these actions can help to prevent food-borne illnesses that can be very dangerous to your baby, such as listeriosis. Ask your health care provider for more information about listeriosis. What does 150 extra calories look like? Healthy options that provide 150 extra calories each day could be any of the following:  6-8 oz (170-230 g) of plain low-fat yogurt with  cup of berries.  1 apple with 2 teaspoons (11 g) of peanut butter.  Cut-up vegetables with  cup (60 g) of hummus.  8 oz (230 mL)  or 1 cup of low-fat chocolate milk.  1 stick of string cheese with 1 medium orange.  1 peanut butter and jelly sandwich that is made with one slice of whole-wheat bread and 1 tsp (5 g) of peanut butter. For 300 extra calories, you could eat two of those healthy options each day. What is a healthy amount of weight to gain? The right amount of weight gain for you is based on your BMI before you became pregnant. If your BMI:  Was less than 18 (underweight), you should gain 28-40 lb (13-18 kg).  Was 18-24.9 (normal), you should gain 25-35 lb (11-16 kg).  Was 25-29.9 (overweight), you should gain 15-25 lb (7-11 kg).  Was 30 or greater (obese), you should gain 11-20 lb (5-9 kg). What if I am having twins or multiples? Generally, if you are carrying twins or  multiples:  You may need to eat 300-600 extra calories a day.  The recommended range for total weight gain is 25-54 lb (11-25 kg), depending on your BMI before pregnancy.  Talk with your health care provider to find out about nutritional needs, weight gain, and exercise that is right for you. What foods can I eat?  Grains All grains. Choose whole grains, such as whole-wheat bread, oatmeal, or brown rice. Vegetables All vegetables. Eat a variety of colors and types of vegetables. Remember to wash your vegetables well before peeling or eating. Fruits All fruits. Eat a variety of colors and types of fruit. Remember to wash your fruits well before peeling or eating. Meats and other protein foods Lean meats, including chicken, [place], fish, and lean cuts of beef, veal, or pork. If you eat fish or seafood, choose options that are higher in omega-3 fatty acids and lower in mercury, such as salmon, herring, mussels, trout, sardines, pollock, shrimp, crab, and lobster. Tofu. Tempeh. Beans. Eggs. Peanut butter and other nut butters. Make sure that all meats, poultry, and eggs are cooked to food-safe temperatures or "well-done." Two or more servings of fish are recommended each week in order to get the most benefits from omega-3 fatty acids that are found in seafood. Choose fish that are lower in mercury. You can find more information online:  PumpkinSearch.com.ee Dairy Pasteurized milk and milk alternatives (such as almond milk). Pasteurized yogurt and pasteurized cheese. Cottage cheese. Sour cream. Beverages Water. Juices that contain 100% fruit juice or vegetable juice. Caffeine-free teas and decaffeinated coffee. Drinks that contain caffeine are okay to drink, but it is better to avoid caffeine. Keep your total caffeine intake to less than 200 mg each day (which is 12 oz or 355 mL of coffee, tea, or soda) or the limit as told by your health care provider. Fats and oils Fats and oils are okay to include in  moderation. Sweets and desserts Sweets and desserts are okay to include in moderation. Seasoning and other foods All pasteurized condiments. The items listed above may not be a complete list of recommended foods and beverages. Contact your dietitian for more options. What foods are not recommended? Vegetables Raw (unpasteurized) vegetable juices. Fruits Unpasteurized fruit juices. Meats and other protein foods Lunch meats, bologna, hot dogs, or other deli meats. (If you must eat those meats, reheat them until they are steaming hot.) Refrigerated pat, meat spreads from a meat counter, smoked seafood that is found in the refrigerated section of a store. Raw or undercooked meats, poultry, and eggs. Raw fish, such as sushi or sashimi. Fish that have high mercury content, such  as tilefish, shark, swordfish, and king mackerel. To learn more about mercury in fish, talk with your health care provider or look for online resources, such as:  PumpkinSearch.com.ee Dairy Raw (unpasteurized) milk and any foods that have raw milk in them. Soft cheeses, such as feta, queso blanco, queso fresco, Brie, Camembert cheeses, blue-veined cheeses, and Panela cheese (unless it is made with pasteurized milk, which must be stated on the label). Beverages Alcohol. Sugar-sweetened beverages, such as sodas, teas, or energy drinks. Seasoning and other foods Homemade fermented foods and drinks, such as pickles, sauerkraut, or kombucha drinks. (Store-bought pasteurized versions of these are okay.) Salads that are made in a store or deli, such as ham salad, chicken salad, egg salad, tuna salad, and seafood salad. The items listed above may not be a complete list of foods and beverages to avoid. Contact your dietitian for more information. Where to find more information To calculate the number of calories you need based on your height, weight, and activity level, you can use an online calculator such  as:  PackageNews.is To calculate how much weight you should gain during pregnancy, you can use an online pregnancy weight gain calculator such as:  http://jones-berg.com/ Summary  While you are pregnant, your body requires additional nutrition to help support your growing baby.  Eat a variety of foods, especially fruits and vegetables to get a full range of vitamins and minerals.  Practice good food safety and cleanliness. Wash your hands before you eat and after you prepare raw meat. Wash all fruits and vegetables well before peeling or eating. Taking these actions can help to prevent food-borne illnesses, such as listeriosis, that can be very dangerous to your baby.  Do not eat raw meat or fish. Do not eat fish that have high mercury content, such as tilefish, shark, swordfish, and king mackerel. Do not eat unpasteurized (raw) dairy.  Take a prenatal vitamin to help meet your additional vitamin and mineral needs during pregnancy, specifically for folic acid, iron, calcium, and vitamin D. This information is not intended to replace advice given to you by your health care provider. Make sure you discuss any questions you have with your health care provider. Document Released: 10/06/2013 Document Revised: 09/18/2016 Document Reviewed: 09/18/2016 Elsevier Interactive Patient Education  2019 [employer].

## 2018-06-04 NOTE — MAU Note (Signed)
Paige Blanchard is a 23 y.o. at [redacted]w[redacted]d here in MAU reporting: was at work this morning and started getting dizzy and nauseated. Reports she has eaten and drank water today. States she passed out and landed on the floor and landed on right side. Since she fell she has vaginal pain. No bleeding, reports lots of discharge.   Onset of complaint: today  Pain score: 10/10  Vitals:   06/04/18 0932  BP: 110/64  Pulse: (!) 105  Resp: 18  Temp: 98.2 F (36.8 C)  SpO2: 98%      Lab orders placed from triage: UA

## 2018-06-12 ENCOUNTER — Other Ambulatory Visit: Payer: Self-pay

## 2018-06-12 ENCOUNTER — Inpatient Hospital Stay (HOSPITAL_COMMUNITY)
Admission: AD | Admit: 2018-06-12 | Discharge: 2018-06-12 | Disposition: A | Discharge status: Home / Self Care | Payer: BLUE CROSS/BLUE SHIELD | Attending: Obstetrics and Gynecology | Admitting: Obstetrics and Gynecology

## 2018-06-12 ENCOUNTER — Encounter (HOSPITAL_COMMUNITY): Payer: Self-pay | Admitting: *Deleted

## 2018-06-12 DIAGNOSIS — O26892 Other specified pregnancy related conditions, second trimester: Secondary | ICD-10-CM | POA: Insufficient documentation

## 2018-06-12 DIAGNOSIS — M545 Low back pain: Secondary | ICD-10-CM | POA: Insufficient documentation

## 2018-06-12 DIAGNOSIS — O368120 Decreased fetal movements, second trimester, not applicable or unspecified: Secondary | ICD-10-CM | POA: Insufficient documentation

## 2018-06-12 DIAGNOSIS — W19XXXA Unspecified fall, initial encounter: Secondary | ICD-10-CM | POA: Diagnosis not present

## 2018-06-12 DIAGNOSIS — O9A212 Injury, poisoning and certain other consequences of external causes complicating pregnancy, second trimester: Secondary | ICD-10-CM

## 2018-06-12 DIAGNOSIS — Z3A25 25 weeks gestation of pregnancy: Secondary | ICD-10-CM | POA: Diagnosis not present

## 2018-06-12 DIAGNOSIS — R42 Dizziness and giddiness: Secondary | ICD-10-CM | POA: Diagnosis not present

## 2018-06-12 DIAGNOSIS — R55 Syncope and collapse: Secondary | ICD-10-CM | POA: Diagnosis not present

## 2018-06-12 LAB — BASIC METABOLIC PANEL
Anion gap: 7 (ref 5–15)
BUN: 6 mg/dL (ref 6–20)
CO2: 21 mmol/L — ABNORMAL LOW (ref 22–32)
Calcium: 9 mg/dL (ref 8.9–10.3)
Chloride: 107 mmol/L (ref 98–111)
Creatinine, Ser: 0.55 mg/dL (ref 0.44–1.00)
GFR calc Af Amer: >60 mL/min (ref >60)
GFR calc non Af Amer: >60 mL/min (ref >60)
Glucose, Bld: 104 mg/dL — ABNORMAL HIGH (ref 70–99)
Potassium: 3.8 mmol/L (ref 3.5–5.1)
Sodium: 135 mmol/L (ref 135–145)

## 2018-06-12 LAB — URINALYSIS, ROUTINE W REFLEX MICROSCOPIC
Bilirubin Urine: NEGATIVE
Glucose, UA: NEGATIVE mg/dL
Hgb urine dipstick: NEGATIVE
Ketones, ur: NEGATIVE mg/dL
Leukocytes,Ua: NEGATIVE
Nitrite: NEGATIVE
Protein, ur: NEGATIVE mg/dL
Specific Gravity, Urine: 1.024 (ref 1.005–1.030)
pH: 5 (ref 5.0–8.0)

## 2018-06-12 LAB — CBC
HCT: 33.2 % — ABNORMAL LOW (ref 36.0–46.0)
Hemoglobin: 11.6 g/dL — ABNORMAL LOW (ref 12.0–15.0)
MCH: 32.9 pg (ref 26.0–34.0)
MCHC: 34.9 g/dL (ref 30.0–36.0)
MCV: 94.1 fL (ref 80.0–100.0)
Platelets: 202 10*3/uL (ref 150–400)
RBC: 3.53 MIL/uL — ABNORMAL LOW (ref 3.87–5.11)
RDW: 14.3 % (ref 11.5–15.5)
WBC: 8.1 10*3/uL (ref 4.0–10.5)
nRBC: 0 % (ref 0.0–0.2)

## 2018-06-12 LAB — GLUCOSE, CAPILLARY: Glucose-Capillary: 97 mg/dL (ref 70–99)

## 2018-06-12 NOTE — MAU Note (Signed)
Paige Blanchard is a 23 y.o. at [redacted]w[redacted]d here in MAU reporting: states she got dizzy at work and fell. Had fruit, a bacon/egg/cheese biscuit, and oatmeal for breakfast. States the fall occurred around 6. States she landed on her back. Was just here at the end of may for the same thing. States she is having back and pelvic pain. No bleeding, no LOF. States she has not felt baby move in 3 days.  Onset of complaint: today  Pain score: back pain 7/10, pelvic pain 10/10  GOT:[id number] attempted in triage, unable to obtain, pt to room 129  Lab orders placed from triage: none

## 2018-06-12 NOTE — Discharge Instructions (Signed)
Syncope Syncope is when you pass out (faint) for a short time. It is caused by a sudden decrease in blood flow to the brain. Signs that you may be about to pass out include:  Feeling dizzy or light-headed.  Feeling sick to your stomach (nauseous).  Seeing all white or all black.  Having cold, clammy skin. If you pass out, get help right away. Call your local emergency services (911 in the [place] Do not drive yourself to the hospital. Follow these instructions at home: Watch for any changes in your symptoms. Take these actions to stay safe and help with your symptoms: Lifestyle  Do not drive, use machinery, or play sports until your doctor says it is okay.  Do not drink alcohol.  Do not use any products that contain nicotine or tobacco, such as cigarettes and e-cigarettes. If you need help quitting, ask your doctor.  Drink enough fluid to keep your pee (urine) pale yellow. General instructions  Take over-the-counter and prescription medicines only as told by your doctor.  If you are taking blood pressure or heart medicine, sit up and stand up slowly. Spend a few minutes getting ready to sit and then stand. This can help you feel less dizzy.  Have someone stay with you until you feel stable.  If you start to feel like you might pass out, lie down right away and raise (elevate) your feet above the level of your heart. Breathe deeply and steadily. Wait until all of the symptoms are gone.  Keep all follow-up visits as told by your doctor. This is important. Get help right away if:  You have a very bad headache.  You pass out once or more than once.  You have pain in your chest, belly, or back.  You have a very fast or uneven heartbeat (palpitations).  It hurts to breathe.  You are bleeding from your mouth or your bottom (rectum).  You have black or tarry poop (stool).  You have jerky movements that you cannot control (seizure).  You are confused.  You have trouble  walking.  You are very weak.  You have vision problems. These symptoms may be an emergency. Do not wait to see if the symptoms will go away. Get medical help right away. Call your local emergency services (911 in the [place] Do not drive yourself to the hospital. Summary  Syncope is when you pass out (faint) for a short time. It is caused by a sudden decrease in blood flow to the brain.  Signs that you may be about to faint include feeling dizzy, light-headed, or sick to your stomach, seeing all white or all black, or having cold, clammy skin.  If you start to feel like you might pass out, lie down right away and raise (elevate) your feet above the level of your heart. Breathe deeply and steadily. Wait until all of the symptoms are gone. This information is not intended to replace advice given to you by your health care provider. Make sure you discuss any questions you have with your health care provider. Document Released: 06/10/2007 Document Revised: 02/03/2017 Document Reviewed: 02/03/2017 Elsevier Interactive Patient Education  2019 Elsevier Inc.  

## 2018-06-12 NOTE — MAU Provider Note (Signed)
Chief Complaint:  Fall; Back Pain; Pelvic Pain; and Decreased Fetal Movement   First Provider Initiated Contact with Patient 06/12/18 0830     HPI: Paige Blanchard is a 23 y.o. G2P0010 at 2239w4d who presents to maternity admissions reporting dizziness & fall. This is the 2nd occurrence during her pregnancy. Was seen in MAU last week for dizziness & syncopal episode. Reports eating breakfast then getting dizzy while at work & fell. Fall occurred at 6 am this morning. Landed on her back. Since then has some low back pain. Reports decrease in fetal movement for the last 3 days. Denies LOF or vaginal bleeding.  Ate fruit, bacon/egg/cheese biscuit, and oatmeal for breakfast.   Location: back Quality: aching Severity: 7/10 in pain scale Duration: 3 hours Timing: constant Modifying factors: none Associated signs and symptoms: none  Past Medical History:  Diagnosis Date  . Dysmenorrhea   . Migraine with aura   . Vitamin D deficiency    OB History  Gravida Para Term Preterm AB Living  2 0 0 0 1 0  SAB TAB Ectopic Multiple Live Births  1 0 0 0 0    # Outcome Date GA Lbr Len/2nd Weight Sex Delivery Anes PTL Lv  2 Current           1 SAB            Past Surgical History:  Procedure Laterality Date  . abcessed tooth  03/14/2017   drained in ED of [facility]  . HERNIA REPAIR     AGE 3  . TONSILLECTOMY AND ADENOIDECTOMY    . TUBES IN EAR    . WISDOM TOOTH EXTRACTION  01/2017   Family History  Problem Relation Age of Onset  . Diabetes Mother   . Hypertension Mother   . Heart disease Father   . Diabetes Maternal Grandmother        TYPE 2  . Diabetes Maternal Grandfather        TYPE 2  . Breast cancer Other   . Cancer Other        PROSTATE   Social History   Tobacco Use  . Smoking status: Never Smoker  . Smokeless tobacco: Never Used  Substance Use Topics  . Alcohol use: No    Frequency: Never  . Drug use: No   Allergies  Allergen Reactions  . Peanut Oil Hives   No  medications prior to admission.    I have reviewed patient's Past Medical Hx, Surgical Hx, Family Hx, Social Hx, medications and allergies.   ROS:  Review of Systems  Constitutional: Negative.   Gastrointestinal: Negative.   Genitourinary: Negative.   Musculoskeletal: Positive for back pain.  Neurological: Positive for dizziness.    Physical Exam   Patient Vitals for the past 24 hrs:  BP Temp Temp src Pulse Resp SpO2 Height Weight  06/12/18 1009 119/69 - - 100 18 98 % - -  06/12/18 0802 91/66 97.7 F (36.5 C) Oral (!) 109 18 98 % - -  06/12/18 0746 - - - - - - 5\' 4"  (1.626 m) (!) 143 kg   Orthostatic VS for the past 24 hrs:  BP- Lying Pulse- Lying BP- Sitting Pulse- Sitting BP- Standing at 0 minutes Pulse- Standing at 0 minutes  06/12/18 0823 101/70 109 106/65 100 108/73 121     Constitutional: Well-developed, well-nourished female in no acute distress.  Cardiovascular: normal rate & rhythm, no murmur Respiratory: normal effort, lung sounds clear throughout GI:  Abd soft, non-tender, gravid appropriate for gestational age. Pos BS x 4 MS: Extremities nontender, no edema, normal ROM Neurologic: Alert and oriented x 4.   NST:  Baseline: 145 bpm, Variability: Good {> 6 bpm), Accelerations: Non-reactive but appropriate for gestational age and Decelerations: Absent   Labs: Results for orders placed or performed during the hospital encounter of 06/12/18 (from the past 24 hour(s))  CBC     Status: Abnormal   Collection Time: 06/12/18  8:27 AM  Result Value Ref Range   WBC 8.1 4.0 - 10.5 K/uL   RBC 3.53 (L) 3.87 - 5.11 MIL/uL   Hemoglobin 11.6 (L) 12.0 - 15.0 g/dL   HCT 16.133.2 (L) 09.636.0 - 04.546.0 %   MCV 94.1 80.0 - 100.0 fL   MCH 32.9 26.0 - 34.0 pg   MCHC 34.9 30.0 - 36.0 g/dL   RDW 40.914.3 81.111.5 - 91.415.5 %   Platelets 202 150 - 400 K/uL   nRBC 0.0 0.0 - 0.2 %  Basic metabolic panel     Status: Abnormal   Collection Time: 06/12/18  8:27 AM  Result Value Ref Range   Sodium 135 135  - 145 mmol/L   Potassium 3.8 3.5 - 5.1 mmol/L   Chloride 107 98 - 111 mmol/L   CO2 21 (L) 22 - 32 mmol/L   Glucose, Bld 104 (H) 70 - 99 mg/dL   BUN 6 6 - 20 mg/dL   Creatinine, Ser 7.820.55 0.44 - 1.00 mg/dL   Calcium 9.0 8.9 - 95.610.3 mg/dL   GFR calc non Af Amer >60 >60 mL/min   GFR calc Af Amer >60 >60 mL/min   Anion gap 7 5 - 15  Glucose, capillary     Status: None   Collection Time: 06/12/18  8:32 AM  Result Value Ref Range   Glucose-Capillary 97 70 - 99 mg/dL  Urinalysis, Routine w reflex microscopic     Status: Abnormal   Collection Time: 06/12/18  8:43 AM  Result Value Ref Range   Color, Urine YELLOW YELLOW   APPearance HAZY (A) CLEAR   Specific Gravity, Urine 1.024 1.005 - 1.030   pH 5.0 5.0 - 8.0   Glucose, UA NEGATIVE NEGATIVE mg/dL   Hgb urine dipstick NEGATIVE NEGATIVE   Bilirubin Urine NEGATIVE NEGATIVE   Ketones, ur NEGATIVE NEGATIVE mg/dL   Protein, ur NEGATIVE NEGATIVE mg/dL   Nitrite NEGATIVE NEGATIVE   Leukocytes,Ua NEGATIVE NEGATIVE    Imaging:  No results found.  MAU Course: Orders Placed This Encounter  Procedures  . Urinalysis, Routine w reflex microscopic  . CBC  . Basic metabolic panel  . Glucose, capillary  . Orthostatic vital signs  . ED EKG  . Discharge patient   No orders of the defined types were placed in this encounter.   MDM: Labs & vitals reassuring EKG comparable to previous studies.  Pt declines pain medication for back No contractions on monitor & cervix closed Fetal tracing appropriate for gestation  Pt has appt in office on Wednesday. Recommend discussing symptoms with her ob. May consider referral to cardiology if continues.   Assessment: 1. Dizziness   2. [redacted] weeks gestation of pregnancy     Plan: Discharge home in stable condition.  Preterm Labor precautions and fetal kick counts  Follow-up Information    [facility]   Contact information: [address] [place]  [id number] [phone]           Allergies as  of 06/12/2018      Reactions   Peanut Oil Hives      Medication List    TAKE these medications   acetaminophen 500 MG tablet Commonly known as:  TYLENOL Take 1,000 mg by mouth every 6 (six) hours as needed.   famotidine 40 MG tablet Commonly known as:  PEPCID Take 1 tablet (40 mg total) by mouth daily.   multivitamin-prenatal 27-0.8 MG Tabs tablet Take 1 tablet by mouth daily at 12 noon.   Proventil HFA 108 (90 Base) MCG/ACT inhaler Generic drug:  albuterol Inhale 2 puffs into the lungs as needed.       Jorje Guild, NP 06/12/2018 10:30 AM

## 2018-06-22 ENCOUNTER — Other Ambulatory Visit: Payer: Self-pay

## 2018-06-22 ENCOUNTER — Inpatient Hospital Stay (HOSPITAL_COMMUNITY)
Admission: AD | Admit: 2018-06-22 | Discharge: 2018-06-22 | Disposition: A | Discharge status: Home / Self Care | Payer: BC Managed Care – PPO | Attending: Obstetrics and Gynecology | Admitting: Obstetrics and Gynecology

## 2018-06-22 ENCOUNTER — Encounter (HOSPITAL_COMMUNITY): Payer: Self-pay

## 2018-06-22 DIAGNOSIS — R300 Dysuria: Secondary | ICD-10-CM | POA: Diagnosis not present

## 2018-06-22 DIAGNOSIS — O368120 Decreased fetal movements, second trimester, not applicable or unspecified: Secondary | ICD-10-CM | POA: Diagnosis not present

## 2018-06-22 DIAGNOSIS — Z8249 Family history of ischemic heart disease and other diseases of the circulatory system: Secondary | ICD-10-CM | POA: Insufficient documentation

## 2018-06-22 DIAGNOSIS — R109 Unspecified abdominal pain: Secondary | ICD-10-CM | POA: Diagnosis not present

## 2018-06-22 DIAGNOSIS — Z3A27 27 weeks gestation of pregnancy: Secondary | ICD-10-CM | POA: Diagnosis not present

## 2018-06-22 DIAGNOSIS — M549 Dorsalgia, unspecified: Secondary | ICD-10-CM | POA: Diagnosis not present

## 2018-06-22 DIAGNOSIS — Z833 Family history of diabetes mellitus: Secondary | ICD-10-CM | POA: Insufficient documentation

## 2018-06-22 DIAGNOSIS — Z362 Encounter for other antenatal screening follow-up: Secondary | ICD-10-CM

## 2018-06-22 DIAGNOSIS — O26892 Other specified pregnancy related conditions, second trimester: Secondary | ICD-10-CM | POA: Diagnosis not present

## 2018-06-22 LAB — WET PREP, GENITAL
Clue Cells Wet Prep HPF POC: NONE SEEN
Sperm: NONE SEEN
Trich, Wet Prep: NONE SEEN
WBC, Wet Prep HPF POC: NONE SEEN
Yeast Wet Prep HPF POC: NONE SEEN

## 2018-06-22 LAB — URINALYSIS, ROUTINE W REFLEX MICROSCOPIC
Bilirubin Urine: NEGATIVE
Glucose, UA: NEGATIVE mg/dL
Hgb urine dipstick: NEGATIVE
Ketones, ur: NEGATIVE mg/dL
Leukocytes,Ua: NEGATIVE
Nitrite: NEGATIVE
Protein, ur: NEGATIVE mg/dL
Specific Gravity, Urine: 1.012 (ref 1.005–1.030)
pH: 7 (ref 5.0–8.0)

## 2018-06-22 MED ORDER — CYCLOBENZAPRINE HCL 10 MG PO TABS
10.0000 mg | ORAL_TABLET | Freq: Once | ORAL | Status: AC
  Administered 2018-06-22: 10 mg via ORAL
  Filled 2018-06-22: qty 1

## 2018-06-22 MED ORDER — COMFORT FIT MATERNITY SUPP MED MISC: 0 refills | Status: DC

## 2018-06-22 NOTE — MAU Note (Signed)
Paige Blanchard is a 23 y.o. at [redacted]w[redacted]d here in MAU reporting: states she has not felt baby move since yesterday morning, has tried juice, cold water, and cereal. Having lower abdominal cramping, no bleeding, no LOF  Onset of complaint: today  Pain score: 7/10  Vitals:   06/22/18 1538  BP: (!) 114/57  Pulse: (!) 117  Resp: 18  Temp: 98.1 F (36.7 C)  SpO2: 98%     FHT: 152  Lab orders placed from triage: UA

## 2018-06-22 NOTE — MAU Provider Note (Signed)
History     CSN: [id number]  Arrival date and time: 06/22/18 1516   First Provider Initiated Contact with Patient 06/22/18 1624      Chief Complaint  Patient presents with  . Decreased Fetal Movement  . Abdominal Pain   Paige Blanchard is a 23 y.o. G2P0010 at 5168w0d who [facility].  She presents today for Decreased Fetal Movement and Abdominal cramping.  She states she hasn't felt movement since yesterday around 9 am despite eating and drinking.  She states she has been feeling movement consistently for the last 2 weeks, but was told that this is abnormal.  She also reports some abdominal cramping that occurs once daily and she rates a 7/10.  She states it lasts about 10-15 minutes, but the area remains "tender" after it resolves.  She feels that it may be [facility] but can't be completely sure.  She reports that she takes tylenol for the pain, when it occurs, but feels like she doesn't get relief from it. Patient also reports back pain and pain with urination for the last month.  Patient does not wear a maternity support belt or belly band.  She endorses recent sexual activity and denies other issues.      OB History    Gravida  2   Para  0   Term  0   Preterm  0   AB  1   Living  0     SAB  1   TAB  0   Ectopic  0   Multiple  0   Live Births  0           Past Medical History:  Diagnosis Date  . Dysmenorrhea   . Migraine with aura   . Vitamin D deficiency     Past Surgical History:  Procedure Laterality Date  . abcessed tooth  03/14/2017   drained in ED of [facility]  . HERNIA REPAIR     AGE 79  . TONSILLECTOMY AND ADENOIDECTOMY    . TUBES IN EAR    . WISDOM TOOTH EXTRACTION  01/2017    Family History  Problem Relation Age of Onset  . Diabetes Mother   . Hypertension Mother   . Heart disease Father   . Diabetes Maternal Grandmother        TYPE 2  . Diabetes Maternal Grandfather        TYPE 2  . Breast cancer Other    . Cancer Other        PROSTATE    Social History   Tobacco Use  . Smoking status: Never Smoker  . Smokeless tobacco: Never Used  Substance Use Topics  . Alcohol use: No    Frequency: Never  . Drug use: No    Allergies:  Allergies  Allergen Reactions  . Peanut Oil Hives    Medications Prior to Admission  Medication Sig Dispense Refill Last Dose  . Prenatal Vit-Fe Fumarate-FA (MULTIVITAMIN-PRENATAL) 27-0.8 MG TABS tablet Take 1 tablet by mouth daily at 12 noon.   06/22/2018 at Unknown time  . acetaminophen (TYLENOL) 500 MG tablet Take 1,000 mg by mouth every 6 (six) hours as needed.     Marland Kitchen. albuterol (PROVENTIL HFA) 108 (90 Base) MCG/ACT inhaler Inhale 2 puffs into the lungs as needed.     . famotidine (PEPCID) 40 MG tablet Take 1 tablet (40 mg total) by mouth daily. 30 tablet 1     Review of  Systems  Constitutional: Negative for chills and fever.  Gastrointestinal: Positive for abdominal pain. Negative for constipation, diarrhea, nausea and vomiting.  Genitourinary: Positive for dysuria (x One Month ) and vaginal discharge (White, thick). Negative for difficulty urinating and vaginal bleeding.  Musculoskeletal: Positive for back pain.  Neurological: Negative for dizziness, light-headedness and headaches.   Physical Exam   Blood pressure 118/68, pulse (!) 107, temperature 98.5 F (36.9 C), temperature source Oral, resp. rate 18, height 5\' 4"  (1.626 m), weight (!) 144.3 kg, last menstrual period 12/15/2017, SpO2 97 %.  Physical Exam  Constitutional: She is oriented to person, place, and time. She appears well-developed and well-nourished.  HENT:  Head: Normocephalic and atraumatic.  Eyes: Conjunctivae are normal.  Neck: Normal range of motion.  Cardiovascular: Normal rate.  Respiratory: Effort normal.  GI: Soft. There is abdominal tenderness in the periumbilical area. There is no CVA tenderness.  Musculoskeletal: Normal range of motion.  Neurological: She is alert and  oriented to person, place, and time.  Skin: Skin is warm and dry.  Psychiatric: She has a normal mood and affect. Her behavior is normal.    Fetal Assessment 150 bpm, Mod Var, -Decels, +15x15 Accels Toco: None graphed  BSUS reveals +fetal movement, +HR, and anterior placenta MAU Course   Results for orders placed or performed during the hospital encounter of 06/22/18 (from the past 24 hour(s))  Urinalysis, Routine w reflex microscopic     Status: Abnormal   Collection Time: 06/22/18  3:41 PM  Result Value Ref Range   Color, Urine YELLOW YELLOW   APPearance HAZY (A) CLEAR   Specific Gravity, Urine 1.012 1.005 - 1.030   pH 7.0 5.0 - 8.0   Glucose, UA NEGATIVE NEGATIVE mg/dL   Hgb urine dipstick NEGATIVE NEGATIVE   Bilirubin Urine NEGATIVE NEGATIVE   Ketones, ur NEGATIVE NEGATIVE mg/dL   Protein, ur NEGATIVE NEGATIVE mg/dL   Nitrite NEGATIVE NEGATIVE   Leukocytes,Ua NEGATIVE NEGATIVE  Wet prep, genital     Status: None   Collection Time: 06/22/18  5:00 PM   Specimen: Vaginal  Result Value Ref Range   Yeast Wet Prep HPF POC NONE SEEN NONE SEEN   Trich, Wet Prep NONE SEEN NONE SEEN   Clue Cells Wet Prep HPF POC NONE SEEN NONE SEEN   WBC, Wet Prep HPF POC NONE SEEN NONE SEEN   Sperm NONE SEEN    No results found.  MDM PE Labs: Wet Prep, GC/CT EFM Pain Medication Assessment and Plan  23 year old G2P0010  SIUP at 2227 weeks Cat I FT DFM Abdominal/Back Pain  -Patient agreeable to Wet prep & GC/CT, but states she doesn't want to self-swab. -Will have nurse perform.  -Exam findings discussed. -Discussed usage of maternity support belt to improve abdominal symptoms.  -Television turned off and patient instructed to sit quietly and monitor for fetal movement. -Patient agreeable to pain medication. -Will give flexeril 10mg  now.  -NST reactive, but will continue to monitor.   Follow Up (5:50 PM)  -Wet prep returns with insignificant findings. -UA returns  negative. -Results discussed with patient. -Informed that GC/CT will return within 2-3 days. -Patient reports no perception of fetal movement, but states "if the heart rate looks good I am okay with that." -Patient also expresses self-awareness that her size may contribute to her inability to perceive fetal movement. -Patient also reports she has not been drinking water and has only had a sip. -Patient states she has yet to receive flexeril  and nurse reports she will give now. -Patient instructed to monitor pain and symptoms and if improved with flexeril dosing, she could request rx from primary ob. -BSUS performed.   *Provider able to identify and point out fetal movement for patient to visualize. *Patient continues to state she does not feel movement despite seeing it on monitor.  *BSUS also reveals anterior placenta and patient educated on how this as well as body habitus can contribute to feelings of decreased fetal movement.   -Patient expresses gratitude and has no questions or concerns. -Encouraged to go home, eat, and reassess fetal movement. -Given Rx for Maternity support belt. -Encouraged to call primary ob or return to MAU if symptoms worsen or with the onset of new symptoms. -Discharged to home in stable condition  Maryann Conners MSN, CNM 06/22/2018, 4:25 PM

## 2018-06-22 NOTE — Discharge Instructions (Signed)
Abdominal Pain During Pregnancy ° °Abdominal pain is common during pregnancy, and has many possible causes. Some causes are more serious than others, and sometimes the cause is not known. Abdominal pain can be a sign that labor is starting. It can also be caused by normal growth and stretching of muscles and ligaments during pregnancy. Always tell your health care provider if you have any abdominal pain. °Follow these instructions at home: °· Do not have sex or put anything in your vagina until your pain goes away completely. °· Get plenty of rest until your pain improves. °· Drink enough fluid to keep your urine pale yellow. °· Take over-the-counter and prescription medicines only as told by your health care provider. °· Keep all follow-up visits as told by your health care provider. This is important. °Contact a health care provider if: °· Your pain continues or gets worse after resting. °· You have lower abdominal pain that: °? Comes and goes at regular intervals. °? Spreads to your back. °? Is similar to menstrual cramps. °· You have pain or burning when you urinate. °Get help right away if: °· You have a fever or chills. °· You have vaginal bleeding. °· You are leaking fluid from your vagina. °· You are passing tissue from your vagina. °· You have vomiting or diarrhea that lasts for more than 24 hours. °· Your baby is moving less than usual. °· You feel very weak or faint. °· You have shortness of breath. °· You develop severe pain in your upper abdomen. °Summary °· Abdominal pain is common during pregnancy, and has many possible causes. °· If you experience abdominal pain during pregnancy, tell your health care provider right away. °· Follow your health care provider's home care instructions and keep all follow-up visits as directed. °This information is not intended to replace advice given to you by your health care provider. Make sure you discuss any questions you have with your health care  provider. °Document Released: 12/22/2004 Document Revised: 03/26/2016 Document Reviewed: 03/26/2016 °Elsevier Interactive Patient Education © 2019 [employer] ° °Second Trimester of Pregnancy °The second trimester is from week 14 through week 27 (months 4 through 6). The second trimester is often a time when you feel your best. Your body has adjusted to being pregnant, and you begin to feel better physically. Usually, morning sickness has lessened or quit completely, you may have more energy, and you may have an increase in appetite. The second trimester is also a time when the fetus is growing rapidly. At the end of the sixth month, the fetus is about 9 inches long and weighs about 1½ pounds. You will likely begin to feel the baby move (quickening) between 16 and 20 weeks of pregnancy. °Body changes during your second trimester °Your body continues to go through many changes during your second trimester. The changes vary from woman to woman. °· Your weight will continue to increase. You will notice your lower abdomen bulging out. °· You may begin to get stretch marks on your hips, abdomen, and breasts. °· You may develop headaches that can be relieved by medicines. The medicines should be approved by your health care provider. °· You may urinate more often because the fetus is pressing on your bladder. °· You may develop or continue to have heartburn as a result of your pregnancy. °· You may develop constipation because certain hormones are causing the muscles that push waste through your intestines to slow down. °· You may develop hemorrhoids or   swollen, bulging veins (varicose veins). °· You may have back pain. This is caused by: °? Weight gain. °? Pregnancy hormones that are relaxing the joints in your pelvis. °? A shift in weight and the muscles that support your balance. °· Your breasts will continue to grow and they will continue to become tender. °· Your gums may bleed and may be sensitive to brushing and  flossing. °· Dark spots or blotches (chloasma, mask of pregnancy) may develop on your face. This will likely fade after the baby is born. °· A dark line from your belly button to the pubic area (linea nigra) may appear. This will likely fade after the baby is born. °· You may have changes in your hair. These can include thickening of your hair, rapid growth, and changes in texture. Some women also have hair loss during or after pregnancy, or hair that feels dry or thin. Your hair will most likely return to normal after your baby is born. °What to expect at prenatal visits °During a routine prenatal visit: °· You will be weighed to make sure you and the fetus are growing normally. °· Your blood pressure will be taken. °· Your abdomen will be measured to track your baby's growth. °· The fetal heartbeat will be listened to. °· Any test results from the previous visit will be discussed. °Your health care provider may ask you: °· How you are feeling. °· If you are feeling the baby move. °· If you have had any abnormal symptoms, such as leaking fluid, bleeding, severe headaches, or abdominal cramping. °· If you are using any tobacco products, including cigarettes, chewing tobacco, and electronic cigarettes. °· If you have any questions. °Other tests that may be performed during your second trimester include: °· Blood tests that check for: °? Low iron levels (anemia). °? High blood sugar that affects pregnant women (gestational diabetes) between 24 and 28 weeks. °? Rh antibodies. This is to check for a protein on red blood cells (Rh factor). °· Urine tests to check for infections, diabetes, or protein in the urine. °· An ultrasound to confirm the proper growth and development of the baby. °· An amniocentesis to check for possible genetic problems. °· Fetal screens for spina bifida and Down syndrome. °· HIV (human immunodeficiency virus) testing. Routine prenatal testing includes screening for HIV, unless you choose not to  have this test. °Follow these instructions at home: °Medicines °· Follow your health care provider's instructions regarding medicine use. Specific medicines may be either safe or unsafe to take during pregnancy. °· Take a prenatal vitamin that contains at least 600 micrograms (mcg) of folic acid. °· If you develop constipation, try taking a stool softener if your health care provider approves. °Eating and drinking ° °· Eat a balanced diet that includes fresh fruits and vegetables, whole grains, good sources of protein such as meat, eggs, or tofu, and low-fat dairy. Your health care provider will help you determine the amount of weight gain that is right for you. °· Avoid raw meat and uncooked cheese. These carry germs that can cause birth defects in the baby. °· If you have low calcium intake from food, talk to your health care provider about whether you should take a daily calcium supplement. °· Limit foods that are high in fat and processed sugars, such as fried and sweet foods. °· To prevent constipation: °? Drink enough fluid to keep your urine clear or pale yellow. °? Eat foods that are high in fiber, such   as fresh fruits and vegetables, whole grains, and beans. °Activity °· Exercise only as directed by your health care provider. Most women can continue their usual exercise routine during pregnancy. Try to exercise for 30 minutes at least 5 days a week. Stop exercising if you experience uterine contractions. °· Avoid heavy lifting, wear low heel shoes, and practice good posture. °· A sexual relationship may be continued unless your health care provider directs you otherwise. °Relieving pain and discomfort °· Wear a good support bra to prevent discomfort from breast tenderness. °· Take warm sitz baths to soothe any pain or discomfort caused by hemorrhoids. Use hemorrhoid cream if your health care provider approves. °· Rest with your legs elevated if you have leg cramps or low back pain. °· If you develop  varicose veins, wear support hose. Elevate your feet for 15 minutes, 3-4 times a day. Limit salt in your diet. °Prenatal Care °· Write down your questions. Take them to your prenatal visits. °· Keep all your prenatal visits as told by your health care provider. This is important. °Safety °· Wear your seat belt at all times when driving. °· Make a list of emergency phone numbers, including numbers for family, friends, the hospital, and police and fire departments. °General instructions °· Ask your health care provider for a referral to a local prenatal education class. Begin classes no later than the beginning of month 6 of your pregnancy. °· Ask for help if you have counseling or nutritional needs during pregnancy. Your health care provider can offer advice or refer you to specialists for help with various needs. °· Do not use hot tubs, steam rooms, or saunas. °· Do not douche or use tampons or scented sanitary pads. °· Do not cross your legs for long periods of time. °· Avoid cat litter boxes and soil used by cats. These carry germs that can cause birth defects in the baby and possibly loss of the fetus by miscarriage or stillbirth. °· Avoid all smoking, herbs, alcohol, and unprescribed drugs. Chemicals in these products can affect the formation and growth of the baby. °· Do not use any products that contain nicotine or tobacco, such as cigarettes and e-cigarettes. If you need help quitting, ask your health care provider. °· Visit your dentist if you have not gone yet during your pregnancy. Use a soft toothbrush to brush your teeth and be gentle when you floss. °Contact a health care provider if: °· You have dizziness. °· You have mild pelvic cramps, pelvic pressure, or nagging pain in the abdominal area. °· You have persistent nausea, vomiting, or diarrhea. °· You have a bad smelling vaginal discharge. °· You have pain when you urinate. °Get help right away if: °· You have a fever. °· You are leaking fluid from  your vagina. °· You have spotting or bleeding from your vagina. °· You have severe abdominal cramping or pain. °· You have rapid weight gain or weight loss. °· You have shortness of breath with chest pain. °· You notice sudden or extreme swelling of your face, hands, ankles, feet, or legs. °· You have not felt your baby move in over an hour. °· You have severe headaches that do not go away when you take medicine. °· You have vision changes. °Summary °· The second trimester is from week 14 through week 27 (months 4 through 6). It is also a time when the fetus is growing rapidly. °· Your body goes through many changes during pregnancy. The changes vary   from woman to woman. °· Avoid all smoking, herbs, alcohol, and unprescribed drugs. These chemicals affect the formation and growth your baby. °· Do not use any tobacco products, such as cigarettes, chewing tobacco, and e-cigarettes. If you need help quitting, ask your health care provider. °· Contact your health care provider if you have any questions. Keep all prenatal visits as told by your health care provider. This is important. °This information is not intended to replace advice given to you by your health care provider. Make sure you discuss any questions you have with your health care provider. °Document Released: 12/16/2000 Document Revised: 01/28/2016 Document Reviewed: 01/28/2016 °Elsevier Interactive Patient Education © 2019 Elsevier Inc. ° °

## 2018-06-23 LAB — GC/CHLAMYDIA PROBE AMP (~~LOC~~) NOT AT ARMC
Chlamydia: NEGATIVE
Neisseria Gonorrhea: NEGATIVE

## 2018-07-05 ENCOUNTER — Encounter (HOSPITAL_COMMUNITY): Payer: Self-pay

## 2018-07-05 ENCOUNTER — Other Ambulatory Visit: Payer: Self-pay

## 2018-07-05 ENCOUNTER — Inpatient Hospital Stay (HOSPITAL_COMMUNITY)
Admission: AD | Admit: 2018-07-05 | Discharge: 2018-07-05 | Disposition: A | Discharge status: Home / Self Care | Payer: BC Managed Care – PPO | Attending: Obstetrics and Gynecology | Admitting: Obstetrics and Gynecology

## 2018-07-05 DIAGNOSIS — O368130 Decreased fetal movements, third trimester, not applicable or unspecified: Secondary | ICD-10-CM | POA: Insufficient documentation

## 2018-07-05 DIAGNOSIS — Z3A28 28 weeks gestation of pregnancy: Secondary | ICD-10-CM | POA: Diagnosis not present

## 2018-07-05 DIAGNOSIS — F419 Anxiety disorder, unspecified: Secondary | ICD-10-CM

## 2018-07-05 DIAGNOSIS — O368131 Decreased fetal movements, third trimester, fetus 1: Secondary | ICD-10-CM | POA: Diagnosis not present

## 2018-07-05 DIAGNOSIS — O99343 Other mental disorders complicating pregnancy, third trimester: Secondary | ICD-10-CM | POA: Diagnosis not present

## 2018-07-05 NOTE — MAU Provider Note (Signed)
Chief Complaint  Patient presents with  . Decreased Fetal Movement     First Provider Initiated Contact with Patient 07/05/18 1810     S: Paige Blanchard  is a 23 y.o. y.o. year old G76P0010 female at [redacted]w[redacted]d weeks gestation who presents to MAU reporting decreased fetal movement. Has not felt much the whole pregnancy and states she is very nervous because her brother's girlfriend had a term stillbirth that she was present for. She is worried about her baby being stillborn. She states she is seen weekly at [facility] because of these concerns. States she had an [place] 6/25 and is scheduled for one 1 week later on 7/2. States she has not been informed of any problems with baby's growth of fluid level. Not all records are scanned un under Media tab, but anatomy US was normal.   Contractions: denies Vaginal bleeding: denies Leaking of fluid: denies  Patient Active Problem List   Diagnosis Date Noted  . Vaginal bleeding in pregnancy, first trimester 03/26/2018  . Obesity in pregnancy, antepartum 03/15/2018  . Abdominal pain complicating pregnancy 85/46/2703  . Supervision of high risk pregnancy, antepartum 02/02/2018  . Mastodynia 05/26/2017  . LGSIL on Pap smear of cervix 05/21/2017  . Recurrent major depressive disorder ([place]) 10/26/2014  . Peanut allergy 11/24/2013  . Class 3 severe obesity due to excess calories without serious comorbidity with body mass index (BMI) of 50.0 to 59.9 in adult ([place]) 11/14/2013  . Migraine 12/17/2011    O:  Patient Vitals for the past 24 hrs:  BP Temp Pulse Resp Height Weight  07/05/18 1858 133/86 - 90-100's 18 - -  07/05/18 1703 135/88 98.2 F (36.8 C) (!) 117 18 5\' 4"  (1.626 m) (!) 143.3 kg   General: NAD, Obese female Heart: Mild tachycardia, decreased to 90-100's.  Lungs: Normal rate and effort Abd: Soft, NT, Gravid, S=D Pelvic: Deferred    NST performed EFM: 150, mod variability, 15x15 accels, no decels. Audible fetal mvmt Toco:   None  Imaging Informal BS US shows active fetus. Pt reassured by seeing fetal mvmt, but doesn't feel much of the mvmt visualized. Anterior placenta.   A: [redacted]w[redacted]d week IUP Decreased fetal movement reactive NST and visible fetal mvmt on [place].  P: Discharge home in stable condition. Preterm Labor precautions and fetal kick counts. Follow-up as scheduled for prenatal visit or sooner as needed if symptoms worsen. Return to maternity admissions as needed if symptoms worsen.  Tamala Julian, [place], [place] 07/05/2018 6:53 PM  2

## 2018-07-05 NOTE — Discharge Instructions (Signed)
Third Trimester of Pregnancy The third trimester is from week 28 through week 40 (months 7 through 9). The third trimester is a time when the unborn baby (fetus) is growing rapidly. At the end of the ninth month, the fetus is about 20 inches in length and weighs 6-10 pounds. Body changes during your third trimester Your body will continue to go through many changes during pregnancy. The changes vary from woman to woman. During the third trimester:  Your weight will continue to increase. You can expect to gain 25-35 pounds (11-16 kg) by the end of the pregnancy.  You may begin to get stretch marks on your hips, abdomen, and breasts.  You may urinate more often because the fetus is moving lower into your pelvis and pressing on your bladder.  You may develop or continue to have heartburn. This is caused by increased hormones that slow down muscles in the digestive tract.  You may develop or continue to have constipation because increased hormones slow digestion and cause the muscles that push waste through your intestines to relax.  You may develop hemorrhoids. These are swollen veins (varicose veins) in the rectum that can itch or be painful.  You may develop swollen, bulging veins (varicose veins) in your legs.  You may have increased body aches in the pelvis, back, or thighs. This is due to weight gain and increased hormones that are relaxing your joints.  You may have changes in your hair. These can include thickening of your hair, rapid growth, and changes in texture. Some women also have hair loss during or after pregnancy, or hair that feels dry or thin. Your hair will most likely return to normal after your baby is born.  Your breasts will continue to grow and they will continue to become tender. A yellow fluid (colostrum) may leak from your breasts. This is the first milk you are producing for your baby.  Your belly button may stick out.  You may notice more swelling in your hands,  face, or ankles.  You may have increased tingling or numbness in your hands, arms, and legs. The skin on your belly may also feel numb.  You may feel short of breath because of your expanding uterus.  You may have more problems sleeping. This can be caused by the size of your belly, increased need to urinate, and an increase in your body's metabolism.  You may notice the fetus "dropping," or moving lower in your abdomen (lightening).  You may have increased vaginal discharge.  You may notice your joints feel loose and you may have pain around your pelvic bone. What to expect at prenatal visits You will have prenatal exams every 2 weeks until week 36. Then you will have weekly prenatal exams. During a routine prenatal visit:  You will be weighed to make sure you and the baby are growing normally.  Your blood pressure will be taken.  Your abdomen will be measured to track your baby's growth.  The fetal heartbeat will be listened to.  Any test results from the previous visit will be discussed.  You may have a cervical check near your due date to see if your cervix has softened or thinned (effaced).  You will be tested for Group B streptococcus. This happens between 35 and 37 weeks. Your health care provider may ask you:  What your birth plan is.  How you are feeling.  If you are feeling the baby move.  If you have had any abnormal   symptoms, such as leaking fluid, bleeding, severe headaches, or abdominal cramping.  If you are using any tobacco products, including cigarettes, chewing tobacco, and electronic cigarettes.  If you have any questions. Other tests or screenings that may be performed during your third trimester include:  Blood tests that check for low iron levels (anemia).  Fetal testing to check the health, activity level, and growth of the fetus. Testing is done if you have certain medical conditions or if there are problems during the pregnancy.  Nonstress test  (NST). This test checks the health of your baby to make sure there are no signs of problems, such as the baby not getting enough oxygen. During this test, a belt is placed around your belly. The baby is made to move, and its heart rate is monitored during movement. What is false labor? False labor is a condition in which you feel small, irregular tightenings of the muscles in the womb (contractions) that usually go away with rest, changing position, or drinking water. These are called Braxton Hicks contractions. Contractions may last for hours, days, or even weeks before true labor sets in. If contractions come at regular intervals, become more frequent, increase in intensity, or become painful, you should see your health care provider. What are the signs of labor?  Abdominal cramps.  Regular contractions that start at 10 minutes apart and become stronger and more frequent with time.  Contractions that start on the top of the uterus and spread down to the lower abdomen and back.  Increased pelvic pressure and dull back pain.  A watery or bloody mucus discharge that comes from the vagina.  Leaking of amniotic fluid. This is also known as your "water breaking." It could be a slow trickle or a gush. Let your health care provider know if it has a color or strange odor. If you have any of these signs, call your health care provider right away, even if it is before your due date. Follow these instructions at home: Medicines  Follow your health care provider's instructions regarding medicine use. Specific medicines may be either safe or unsafe to take during pregnancy.  Take a prenatal vitamin that contains at least 600 micrograms (mcg) of folic acid.  If you develop constipation, try taking a stool softener if your health care provider approves. Eating and drinking   Eat a balanced diet that includes fresh fruits and vegetables, whole grains, good sources of protein such as meat, eggs, or tofu,  and low-fat dairy. Your health care provider will help you determine the amount of weight gain that is right for you.  Avoid raw meat and uncooked cheese. These carry germs that can cause birth defects in the baby.  If you have low calcium intake from food, talk to your health care provider about whether you should take a daily calcium supplement.  Eat four or five small meals rather than three large meals a day.  Limit foods that are high in fat and processed sugars, such as fried and sweet foods.  To prevent constipation: ? Drink enough fluid to keep your urine clear or pale yellow. ? Eat foods that are high in fiber, such as fresh fruits and vegetables, whole grains, and beans. Activity  Exercise only as directed by your health care provider. Most women can continue their usual exercise routine during pregnancy. Try to exercise for 30 minutes at least 5 days a week. Stop exercising if you experience uterine contractions.  Avoid heavy lifting.  Do   not exercise in extreme heat or humidity, or at high altitudes.  Wear low-heel, comfortable shoes.  Practice good posture.  You may continue to have sex unless your health care provider tells you otherwise. Relieving pain and discomfort  Take frequent breaks and rest with your legs elevated if you have leg cramps or low back pain.  Take warm sitz baths to soothe any pain or discomfort caused by hemorrhoids. Use hemorrhoid cream if your health care provider approves.  Wear a good support bra to prevent discomfort from breast tenderness.  If you develop varicose veins: ? Wear support pantyhose or compression stockings as told by your healthcare provider. ? Elevate your feet for 15 minutes, 3-4 times a day. Prenatal care  Write down your questions. Take them to your prenatal visits.  Keep all your prenatal visits as told by your health care provider. This is important. Safety  Wear your seat belt at all times when driving.  Make  a list of emergency phone numbers, including numbers for family, friends, the hospital, and police and fire departments. General instructions  Avoid cat litter boxes and soil used by cats. These carry germs that can cause birth defects in the baby. If you have a cat, ask someone to clean the litter box for you.  Do not travel far distances unless it is absolutely necessary and only with the approval of your health care provider.  Do not use hot tubs, steam rooms, or saunas.  Do not drink alcohol.  Do not use any products that contain nicotine or tobacco, such as cigarettes and e-cigarettes. If you need help quitting, ask your health care provider.  Do not use any medicinal herbs or unprescribed drugs. These chemicals affect the formation and growth of the baby.  Do not douche or use tampons or scented sanitary pads.  Do not cross your legs for long periods of time.  To prepare for the arrival of your baby: ? Take prenatal classes to understand, practice, and ask questions about labor and delivery. ? Make a trial run to the hospital. ? Visit the hospital and tour the maternity area. ? Arrange for maternity or paternity leave through employers. ? Arrange for family and friends to take care of pets while you are in the hospital. ? Purchase a rear-facing car seat and make sure you know how to install it in your car. ? Pack your hospital bag. ? Prepare the baby's nursery. Make sure to remove all pillows and stuffed animals from the baby's crib to prevent suffocation.  Visit your dentist if you have not gone during your pregnancy. Use a soft toothbrush to brush your teeth and be gentle when you floss. Contact a health care provider if:  You are unsure if you are in labor or if your water has broken.  You become dizzy.  You have mild pelvic cramps, pelvic pressure, or nagging pain in your abdominal area.  You have lower back pain.  You have persistent nausea, vomiting, or diarrhea.   You have an unusual or bad smelling vaginal discharge.  You have pain when you urinate. Get help right away if:  Your water breaks before 37 weeks.  You have regular contractions less than 5 minutes apart before 37 weeks.  You have a fever.  You are leaking fluid from your vagina.  You have spotting or bleeding from your vagina.  You have severe abdominal pain or cramping.  You have rapid weight loss or weight gain.  You have   shortness of breath with chest pain.  You notice sudden or extreme swelling of your face, hands, ankles, feet, or legs.  Your baby makes fewer than 10 movements in 2 hours.  You have severe headaches that do not go away when you take medicine.  You have vision changes. Summary  The third trimester is from week 28 through week 40, months 7 through 9. The third trimester is a time when the unborn baby (fetus) is growing rapidly.  During the third trimester, your discomfort may increase as you and your baby continue to gain weight. You may have abdominal, leg, and back pain, sleeping problems, and an increased need to urinate.  During the third trimester your breasts will keep growing and they will continue to become tender. A yellow fluid (colostrum) may leak from your breasts. This is the first milk you are producing for your baby.  False labor is a condition in which you feel small, irregular tightenings of the muscles in the womb (contractions) that eventually go away. These are called Braxton Hicks contractions. Contractions may last for hours, days, or even weeks before true labor sets in.  Signs of labor can include: abdominal cramps; regular contractions that start at 10 minutes apart and become stronger and more frequent with time; watery or bloody mucus discharge that comes from the vagina; increased pelvic pressure and dull back pain; and leaking of amniotic fluid. This information is not intended to replace advice given to you by your health  care provider. Make sure you discuss any questions you have with your health care provider. Document Released: 12/16/2000 Document Revised: 04/14/2018 Document Reviewed: 01/28/2016 Elsevier Patient Education  2020 [employer]  

## 2018-07-05 NOTE — MAU Note (Signed)
Pt stated she has not felt baby move much today. Has had this in the  Past and MD is monitoring it but told her to come in today since she has not felt any movement today. Also c/o headache. Taking tylenol without much relief. Pt reports she has had some abd cramping/sharp pains sometimes when she changed positions or getting up from bed.

## 2018-07-15 ENCOUNTER — Encounter (HOSPITAL_COMMUNITY): Payer: Self-pay | Admitting: *Deleted

## 2018-07-15 ENCOUNTER — Inpatient Hospital Stay (HOSPITAL_COMMUNITY)
Admission: AD | Admit: 2018-07-15 | Discharge: 2018-07-15 | Disposition: A | Discharge status: Home / Self Care | Payer: BC Managed Care – PPO | Attending: Obstetrics and Gynecology | Admitting: Obstetrics and Gynecology

## 2018-07-15 ENCOUNTER — Other Ambulatory Visit: Payer: Self-pay

## 2018-07-15 DIAGNOSIS — O4693 Antepartum hemorrhage, unspecified, third trimester: Secondary | ICD-10-CM | POA: Diagnosis present

## 2018-07-15 DIAGNOSIS — Z3A30 30 weeks gestation of pregnancy: Secondary | ICD-10-CM | POA: Diagnosis not present

## 2018-07-15 LAB — URINALYSIS, ROUTINE W REFLEX MICROSCOPIC
Bilirubin Urine: NEGATIVE
Glucose, UA: NEGATIVE mg/dL
Ketones, ur: NEGATIVE mg/dL
Nitrite: NEGATIVE
Protein, ur: 100 mg/dL — AB
RBC / HPF: >50 RBC/hpf — ABNORMAL HIGH (ref 0–5)
Specific Gravity, Urine: 1.016 (ref 1.005–1.030)
WBC, UA: >50 WBC/hpf — ABNORMAL HIGH (ref 0–5)
pH: 8 (ref 5.0–8.0)

## 2018-07-15 LAB — WET PREP, GENITAL
Clue Cells Wet Prep HPF POC: NONE SEEN
Sperm: NONE SEEN
Trich, Wet Prep: NONE SEEN
Yeast Wet Prep HPF POC: NONE SEEN

## 2018-07-15 NOTE — MAU Note (Signed)
Is bleeding, when she woke up, she had to pee really bad, when she wiped there was blood and a clot. (about the size of a penny).  Keeps getting urge  To pee, goes to br- sees blood when she wipes.  Having some pain in lower back, just started- only when she is walking.

## 2018-07-15 NOTE — MAU Provider Note (Signed)
Chief Complaint:  Vaginal Bleeding   First Provider Initiated Contact with Patient 07/15/18 1207     HPI: Paige Blanchard is a 23 y.o. G2P0010 at [redacted]w[redacted]d who presents to maternity admissions reporting vaginal bleeding. Symptoms started this morning. Reports bright red spotting on toilet paper. Not bleeding into pads or passing clots. No abdominal pain. Reports some low back pain. No recent intercourse.  Denies contractions or LOF. Good fetal movement. Has appointment with her ob this afternoon at 215 pm.   Past Medical History:  Diagnosis Date  . Dysmenorrhea   . Migraine with aura   . Vitamin D deficiency    OB History  Gravida Para Term Preterm AB Living  2 0 0 0 1 0  SAB TAB Ectopic Multiple Live Births  1 0 0 0 0    # Outcome Date GA Lbr Len/2nd Weight Sex Delivery Anes PTL Lv  2 Current           1 SAB            Past Surgical History:  Procedure Laterality Date  . abcessed tooth  03/14/2017   drained in ED of [facility]  . HERNIA REPAIR     AGE 26  . TONSILLECTOMY AND ADENOIDECTOMY    . TUBES IN EAR    . WISDOM TOOTH EXTRACTION  01/2017   Family History  Problem Relation Age of Onset  . Diabetes Mother   . Hypertension Mother   . Heart disease Father   . Diabetes Maternal Grandmother        TYPE 2  . Diabetes Maternal Grandfather        TYPE 2  . Breast cancer Other   . Cancer Other        PROSTATE   Social History   Tobacco Use  . Smoking status: Never Smoker  . Smokeless tobacco: Never Used  Substance Use Topics  . Alcohol use: No    Frequency: Never  . Drug use: No   Allergies  Allergen Reactions  . Peanut Oil Hives   No medications prior to admission.    I have reviewed patient's Past Medical Hx, Surgical Hx, Family Hx, Social Hx, medications and allergies.   ROS:  Review of Systems  Constitutional: Negative.   Gastrointestinal: Negative.   Genitourinary: Positive for vaginal bleeding.  Musculoskeletal: Positive for back pain.    Physical Exam    Patient Vitals for the past 24 hrs:  BP Temp Temp src Pulse Resp SpO2 Weight  07/15/18 1249 112/80 - - 96 16 - -  07/15/18 1123 (!) 127/55 98.6 F (37 C) Oral (!) 109 18 99 % (!) 144.7 kg    Constitutional: Well-developed, well-nourished female in no acute distress.  Cardiovascular: normal rate & rhythm, no murmur Respiratory: normal effort, lung sounds clear throughout GI: Abd soft, non-tender, gravid appropriate for gestational age. Pos BS x 4. No CVAT MS: Extremities nontender, no edema, normal ROM Neurologic: Alert and oriented x 4.  GU:      Pelvic: NEFG, vaginal walls mildly erythematous. Small amount of mucoid white discharge. No blood. Cervix not friable.    Dilation: Closed Effacement (%): Thick Cervical Position: Posterior Exam by:: Jorje Guild NP  NST:  Baseline: 145 bpm, Variability: Good {> 6 bpm), Accelerations: Non-reactive but appropriate for gestational age and Decelerations: Absent   Labs: Results for orders placed or performed during the hospital encounter of 07/15/18 (from the past 24 hour(s))  Urinalysis, Routine w reflex microscopic  Status: Abnormal   Collection Time: 07/15/18 11:32 AM  Result Value Ref Range   Color, Urine YELLOW YELLOW   APPearance CLOUDY (A) CLEAR   Specific Gravity, Urine 1.016 1.005 - 1.030   pH 8.0 5.0 - 8.0   Glucose, UA NEGATIVE NEGATIVE mg/dL   Hgb urine dipstick LARGE (A) NEGATIVE   Bilirubin Urine NEGATIVE NEGATIVE   Ketones, ur NEGATIVE NEGATIVE mg/dL   Protein, ur 782100 (A) NEGATIVE mg/dL   Nitrite NEGATIVE NEGATIVE   Leukocytes,Ua LARGE (A) NEGATIVE   RBC / HPF >50 (H) 0 - 5 RBC/hpf   WBC, UA >50 (H) 0 - 5 WBC/hpf   Bacteria, UA MANY (A) NONE SEEN   Non Squamous Epithelial 0-5 (A) NONE SEEN  Wet prep, genital     Status: Abnormal   Collection Time: 07/15/18 12:23 PM  Result Value Ref Range   Yeast Wet Prep HPF POC NONE SEEN NONE SEEN   Trich, Wet Prep NONE SEEN NONE SEEN   Clue Cells Wet Prep HPF POC NONE SEEN  NONE SEEN   WBC, Wet Prep HPF POC MANY (A) NONE SEEN   Sperm NONE SEEN     Imaging:  No results found.  MAU Course: Orders Placed This Encounter  Procedures  . Culture, OB Urine  . Wet prep, genital  . Urinalysis, Routine w reflex microscopic  . Discharge patient   No orders of the defined types were placed in this encounter.   MDM: Category 1 tracing. No contractions.  No blood on exam. Cervix visually closed.  Wet prep negative aside from WBCs. GC/CT pending RH positive U/a with large leuks & hemoglobin. Will send urine for culture.  Pt afebrile & no CVAT  Assessment: 1. Vaginal bleeding in pregnancy, third trimester   2. [redacted] weeks gestation of pregnancy     Plan: Discharge home in stable condition.  Preterm Labor precautions and fetal kick counts GC/CT & urine culture pending Keep f/u with OB this afternoon  Follow-up Information    [facility]   Contact information: [address] [place] [id number] [phone]           Allergies as of 07/15/2018      Reactions   Peanut Oil Hives      Medication List    TAKE these medications   acetaminophen 500 MG tablet Commonly known as: TYLENOL Take 1,000 mg by mouth every 6 (six) hours as needed.   Comfort Fit Maternity Supp Med Misc Wear belt during the day, as needed, and remove at night prior to bedtime.   multivitamin-prenatal 27-0.8 MG Tabs tablet Take 1 tablet by mouth daily at 12 noon.   Proventil HFA 108 (90 Base) MCG/ACT inhaler Generic drug: albuterol Inhale 2 puffs into the lungs as needed.     ASK your doctor about these medications   famotidine 40 MG tablet Commonly known as: PEPCID Take 1 tablet (40 mg total) by mouth daily.       Judeth HornLawrence, Emerlyn Mehlhoff, NP 07/16/2018 9:07 AM

## 2018-07-15 NOTE — Discharge Instructions (Signed)
Warning Signs During Pregnancy °A pregnancy lasts about 40 weeks, starting from the first day of your last period until the baby is born. Pregnancy is divided into three phases called trimesters. °· The first trimester refers to week 1 through week 13 of pregnancy. °· The second trimester is the start of week 14 through the end of week 27. °· The third trimester is the start of week 28 until you deliver your baby. °During each trimester of pregnancy, certain signs and symptoms may indicate a problem. Talk with your health care provider about your current health and any medical conditions you have. Make sure you know the symptoms that you should watch for and report. °How does this affect me? ° °Warning signs in the first trimester °While some changes during the first trimester may be uncomfortable, most do not represent a serious problem. Let your health care provider know if you have any of the following warning signs in the first trimester: °· You cannot eat or drink without vomiting, and this lasts for longer than a day. °· You have vaginal bleeding or spotting along with menstrual-like cramping. °· You have diarrhea for longer than a day. °· You have a fever or other signs of infection, such as: °? Pain or burning when you urinate. °? Foul smelling or thick or yellowish vaginal discharge. °Warning signs in the second trimester °As your baby grows and changes during the second trimester, there are additional signs and symptoms that may indicate a problem. These include: °· Signs and symptoms of infection, including a fever. °· Signs or symptoms of a miscarriage or preterm labor, such as regular contractions, menstrual-like cramping, or lower abdominal pain. °· Bloody or watery vaginal discharge or obvious vaginal bleeding. °· Feeling like your heart is pounding. °· Having trouble breathing. °· Nausea, vomiting, or diarrhea that lasts for longer than a day. °· Craving non-food items, such as clay, chalk, or dirt.  This may be a sign of a very treatable medical condition called pica. °Later in your second trimester, watch for signs and symptoms of a serious medical condition called preeclampsia.These include: °· Changes in your vision. °· A severe headache that does not go away. °· Nausea and vomiting. °It is also important to notice if your baby stops moving or moves less than usual during this time. °Warning signs in the third trimester °As you approach the third trimester, your baby is growing and your body is preparing for the birth of your baby. In your third trimester, be sure to let your health care provider know if: °· You have signs and symptoms of infection, including a fever. °· You have vaginal bleeding. °· You notice that your baby is moving less than usual or is not moving. °· You have nausea, vomiting, or diarrhea that lasts for longer than a day. °· You have a severe headache that does not go away. °· You have vision changes, including seeing spots or having blurry or double vision. °· You have increased swelling in your hands or face. °How does this affect my baby? °Throughout your pregnancy, always report any of the warning signs of a problem to your health care provider. This can help prevent complications that may affect your baby, including: °· Increased risk for premature birth. °· Infection that may be transmitted to your baby. °· Increased risk for stillbirth. °Contact a health care provider if: °· You have any of the warning signs of a problem for the current trimester of your pregnancy. °·   Any of the following apply to you during any trimester of pregnancy: °? You have strong emotions, such as sadness or anxiety, that interfere with work or personal relationships. °? You feel unsafe in your home and need help finding a safe place to live. °? You are using tobacco products, alcohol, or drugs and you need help to stop. °Get help right away if: °You have signs or symptoms of labor before 37 weeks of  pregnancy. These include: °· Contractions that are 5 minutes or less apart, or that increase in frequency, intensity, or length. °· Sudden, sharp abdominal pain or low back pain. °· Uncontrolled gush or trickle of fluid from your vagina. °Summary °· A pregnancy lasts about 40 weeks, starting from the first day of your last period until the baby is born. Pregnancy is divided into three phases called trimesters. Each trimester has warning signs to watch for. °· Always report any warning signs to your health care provider in order to prevent complications that may affect both you and your baby. °· Talk with your health care provider about your current health and any medical conditions you have. Make sure you know the symptoms that you should watch for and report. °This information is not intended to replace advice given to you by your health care provider. Make sure you discuss any questions you have with your health care provider. °Document Released: 10/08/2016 Document Revised: 04/12/2018 Document Reviewed: 10/08/2016 °Elsevier Patient Education © 2020 [employer] Inc. ° °

## 2018-07-16 LAB — CULTURE, OB URINE: Culture: >=100000 — AB

## 2018-07-18 LAB — GC/CHLAMYDIA PROBE AMP (~~LOC~~) NOT AT ARMC
Chlamydia: NEGATIVE
Neisseria Gonorrhea: NEGATIVE

## 2018-07-21 ENCOUNTER — Encounter (HOSPITAL_COMMUNITY): Payer: Self-pay

## 2018-07-21 ENCOUNTER — Inpatient Hospital Stay (HOSPITAL_COMMUNITY)
Admission: AD | Admit: 2018-07-21 | Discharge: 2018-07-21 | Disposition: A | Discharge status: Home / Self Care | Payer: BC Managed Care – PPO | Attending: Obstetrics and Gynecology | Admitting: Obstetrics and Gynecology

## 2018-07-21 ENCOUNTER — Other Ambulatory Visit: Payer: Self-pay

## 2018-07-21 DIAGNOSIS — R109 Unspecified abdominal pain: Secondary | ICD-10-CM | POA: Diagnosis not present

## 2018-07-21 DIAGNOSIS — O26893 Other specified pregnancy related conditions, third trimester: Secondary | ICD-10-CM | POA: Diagnosis not present

## 2018-07-21 DIAGNOSIS — O26899 Other specified pregnancy related conditions, unspecified trimester: Secondary | ICD-10-CM

## 2018-07-21 DIAGNOSIS — Z3A31 31 weeks gestation of pregnancy: Secondary | ICD-10-CM | POA: Insufficient documentation

## 2018-07-21 LAB — URINALYSIS, ROUTINE W REFLEX MICROSCOPIC
Bilirubin Urine: NEGATIVE
Glucose, UA: NEGATIVE mg/dL
Hgb urine dipstick: NEGATIVE
Ketones, ur: NEGATIVE mg/dL
Nitrite: NEGATIVE
Protein, ur: NEGATIVE mg/dL
Specific Gravity, Urine: 1.009 (ref 1.005–1.030)
pH: 7 (ref 5.0–8.0)

## 2018-07-21 MED ORDER — ACETAMINOPHEN 500 MG PO TABS
1000.0000 mg | ORAL_TABLET | Freq: Once | ORAL | Status: AC
  Administered 2018-07-21: 1000 mg via ORAL
  Filled 2018-07-21: qty 2

## 2018-07-21 NOTE — MAU Note (Signed)
Presents with strong, lower abd, menstrual-like cramping starting approx one hour ago. Pain 10/10. No vag bldg or LOF. Dec FM  Gilmer Mor RN

## 2018-07-21 NOTE — Discharge Instructions (Signed)
Fetal Movement Counts Patient Name: ________________________________________________ Patient Due Date: ____________________ What is a fetal movement count?  A fetal movement count is the number of times that you feel your baby move during a certain amount of time. This may also be called a fetal kick count. A fetal movement count is recommended for every pregnant woman. You may be asked to start counting fetal movements as early as week 28 of your pregnancy. Pay attention to when your baby is most active. You may notice your baby's sleep and wake cycles. You may also notice things that make your baby move more. You should do a fetal movement count:  When your baby is normally most active.  At the same time each day. A good time to count movements is while you are resting, after having something to eat and drink. How do I count fetal movements? 1. Find a quiet, comfortable area. Sit, or lie down on your side. 2. Write down the date, the start time and stop time, and the number of movements that you felt between those two times. Take this information with you to your health care visits. 3. For 2 hours, count kicks, flutters, swishes, rolls, and jabs. You should feel at least 10 movements during 2 hours. 4. You may stop counting after you have felt 10 movements. 5. If you do not feel 10 movements in 2 hours, have something to eat and drink. Then, keep resting and counting for 1 hour. If you feel at least 4 movements during that hour, you may stop counting. Contact a health care provider if:  You feel fewer than 4 movements in 2 hours.  Your baby is not moving like he or she usually does. Date: ____________ Start time: ____________ Stop time: ____________ Movements: ____________ Date: ____________ Start time: ____________ Stop time: ____________ Movements: ____________ Date: ____________ Start time: ____________ Stop time: ____________ Movements: ____________ Date: ____________ Start time:  ____________ Stop time: ____________ Movements: ____________ Date: ____________ Start time: ____________ Stop time: ____________ Movements: ____________ Date: ____________ Start time: ____________ Stop time: ____________ Movements: ____________ Date: ____________ Start time: ____________ Stop time: ____________ Movements: ____________ Date: ____________ Start time: ____________ Stop time: ____________ Movements: ____________ Date: ____________ Start time: ____________ Stop time: ____________ Movements: ____________ This information is not intended to replace advice given to you by your health care provider. Make sure you discuss any questions you have with your health care provider. Document Released: 01/21/2006 Document Revised: 01/11/2018 Document Reviewed: 01/31/2015 Elsevier Patient Education  2020 Else[employer]. Braxton Hicks Contractions Contractions of the uterus can occur throughout pregnancy, but they are not always a sign that you are in labor. You may have practice contractions called Braxton Hicks contractions. These false labor contractions are sometimes confused with true labor. What are Braxton Hicks contractions? Braxton Hicks contractions are tightening movements that occur in the muscles of the uterus before labor. Unlike true labor contractions, these contractions do not result in opening (dilation) and thinning of the cervix. Toward the end of pregnancy (32-34 weeks), Braxton Hicks contractions can happen more often and may become stronger. These contractions are sometimes difficult to tell apart from true labor because they can be very uncomfortable. You should not feel embarrassed if you go to the hospital with false labor. Sometimes, the only way to tell if you are in true labor is for your health care provider to look for changes in the cervix. The health care provider will do a physical exam and may monitor your contractions. If you   are not in true labor, the exam should show  that your cervix is not dilating and your water has not broken. If there are no other health problems associated with your pregnancy, it is completely safe for you to be sent home with false labor. You may continue to have Braxton Hicks contractions until you go into true labor. How to tell the difference between true labor and false labor True labor  Contractions last 30-70 seconds.  Contractions become very regular.  Discomfort is usually felt in the top of the uterus, and it spreads to the lower abdomen and low back.  Contractions do not go away with walking.  Contractions usually become more intense and increase in frequency.  The cervix dilates and gets thinner. False labor  Contractions are usually shorter and not as strong as true labor contractions.  Contractions are usually irregular.  Contractions are often felt in the front of the lower abdomen and in the groin.  Contractions may go away when you walk around or change positions while lying down.  Contractions get weaker and are shorter-lasting as time goes on.  The cervix usually does not dilate or become thin. Follow these instructions at home:   Take over-the-counter and prescription medicines only as told by your health care provider.  Keep up with your usual exercises and follow other instructions from your health care provider.  Eat and drink lightly if you think you are going into labor.  If Braxton Hicks contractions are making you uncomfortable: ? Change your position from lying down or resting to walking, or change from walking to resting. ? Sit and rest in a tub of warm water. ? Drink enough fluid to keep your urine pale yellow. Dehydration may cause these contractions. ? Do slow and deep breathing several times an hour.  Keep all follow-up prenatal visits as told by your health care provider. This is important. Contact a health care provider if:  You have a fever.  You have continuous pain in  your abdomen. Get help right away if:  Your contractions become stronger, more regular, and closer together.  You have fluid leaking or gushing from your vagina.  You pass blood-tinged mucus (bloody show).  You have bleeding from your vagina.  You have low back pain that you never had before.  You feel your baby's head pushing down and causing pelvic pressure.  Your baby is not moving inside you as much as it used to. Summary  Contractions that occur before labor are called Braxton Hicks contractions, false labor, or practice contractions.  Braxton Hicks contractions are usually shorter, weaker, farther apart, and less regular than true labor contractions. True labor contractions usually become progressively stronger and regular, and they become more frequent.  Manage discomfort from Braxton Hicks contractions by changing position, resting in a warm bath, drinking plenty of water, or practicing deep breathing. This information is not intended to replace advice given to you by your health care provider. Make sure you discuss any questions you have with your health care provider. Document Released: 05/07/2016 Document Revised: 12/04/2016 Document Reviewed: 05/07/2016 Elsevier Patient Education  2020 Elsevier Inc.  

## 2018-07-21 NOTE — MAU Provider Note (Signed)
Chief Complaint:  Abdominal Cramping   First Provider Initiated Contact with Patient 07/21/18 0331     HPI: Paige FavorsKeziah R Blanchard is a 23 y.o. G2P0010 at 804w1d who presents to maternity admissions reporting abdominal cramping. Symptoms started 1 hour prior to arrival. Reports constant cramping that feels like period cramps & radiates to bilateral sides & back. Vomited once yesterday; otherwise denies nausea. No diarrhea or constipation. Denies dysuria, vaginal bleeding, or LOF. No recent intercourse.  Good fetal movement.   Location: abdomen Quality: cramping Severity: 10/10 in pain scale Duration: 1 hour Timing: constant Modifying factors: none Associated signs and symptoms: none  Past Medical History:  Diagnosis Date  . Dysmenorrhea   . Migraine with aura   . Vitamin D deficiency    OB History  Gravida Para Term Preterm AB Living  2 0 0 0 1 0  SAB TAB Ectopic Multiple Live Births  1 0 0 0 0    # Outcome Date GA Lbr Len/2nd Weight Sex Delivery Anes PTL Lv  2 Current           1 SAB            Past Surgical History:  Procedure Laterality Date  . abcessed tooth  03/14/2017   drained in ED of [facility]  . HERNIA REPAIR     AGE 11  . TONSILLECTOMY AND ADENOIDECTOMY    . TUBES IN EAR    . WISDOM TOOTH EXTRACTION  01/2017   Family History  Problem Relation Age of Onset  . Diabetes Mother   . Hypertension Mother   . Heart disease Father   . Diabetes Maternal Grandmother        TYPE 2  . Diabetes Maternal Grandfather        TYPE 2  . Breast cancer Other   . Cancer Other        PROSTATE   Social History   Tobacco Use  . Smoking status: Never Smoker  . Smokeless tobacco: Never Used  Substance Use Topics  . Alcohol use: No    Frequency: Never  . Drug use: No   Allergies  Allergen Reactions  . Peanut Oil Hives  . Peanut-Containing Drug Products    Medications Prior to Admission  Medication Sig Dispense Refill Last Dose  . acetaminophen (TYLENOL) 500 MG tablet Take  1,000 mg by mouth every 6 (six) hours as needed.     Marland Kitchen. albuterol (PROVENTIL HFA) 108 (90 Base) MCG/ACT inhaler Inhale 2 puffs into the lungs as needed.     . Elastic Bandages & Supports (COMFORT FIT MATERNITY SUPP MED) MISC Wear belt during the day, as needed, and remove at night prior to bedtime. 1 each 0   . famotidine (PEPCID) 40 MG tablet Take 1 tablet (40 mg total) by mouth daily. 30 tablet 1   . Prenatal Vit-Fe Fumarate-FA (MULTIVITAMIN-PRENATAL) 27-0.8 MG TABS tablet Take 1 tablet by mouth daily at 12 noon.       I have reviewed patient's Past Medical Hx, Surgical Hx, Family Hx, Social Hx, medications and allergies.   ROS:  Review of Systems  Constitutional: Negative.   Gastrointestinal: Positive for abdominal pain and vomiting (once yesterday). Negative for constipation, diarrhea and nausea.  Genitourinary: Negative.     Physical Exam   Patient Vitals for the past 24 hrs:  BP Temp Temp src Pulse Resp SpO2  07/21/18 0455 112/60 98.1 F (36.7 C) Oral 95 17 -  07/21/18 [id number] - - - Marland Kitchen(!)  110 - -  07/21/18 0313 (!) 96/44 - - (!) 125 - -  07/21/18 0310 - - - - - 98 %  07/21/18 0306 (!) 104/36 98.1 F (36.7 C) Oral (!) 122 18 98 %  07/21/18 0305 - - - - - 98 %    Constitutional: Well-developed, well-nourished female in no acute distress.  Cardiovascular: normal rate & rhythm, no murmur Respiratory: normal effort, lung sounds clear throughout GI: Abd soft, non-tender, gravid appropriate for gestational age. Pos BS x 4 MS: Extremities nontender, no edema, normal ROM Neurologic: Alert and oriented x 4.  GU:  Dilation: Closed Effacement (%): Thick Exam by:: Judeth HornErin Baneen Wieseler NP   NST:  Baseline: 145 bpm, Variability: Good {> 6 bpm), Accelerations: Reactive and Decelerations: Absent   Labs: Results for orders placed or performed during the hospital encounter of 07/21/18 (from the past 24 hour(s))  Urinalysis, Routine w reflex microscopic     Status: Abnormal   Collection Time:  07/21/18  3:10 AM  Result Value Ref Range   Color, Urine YELLOW YELLOW   APPearance HAZY (A) CLEAR   Specific Gravity, Urine 1.009 1.005 - 1.030   pH 7.0 5.0 - 8.0   Glucose, UA NEGATIVE NEGATIVE mg/dL   Hgb urine dipstick NEGATIVE NEGATIVE   Bilirubin Urine NEGATIVE NEGATIVE   Ketones, ur NEGATIVE NEGATIVE mg/dL   Protein, ur NEGATIVE NEGATIVE mg/dL   Nitrite NEGATIVE NEGATIVE   Leukocytes,Ua TRACE (A) NEGATIVE   RBC / HPF 0-5 0 - 5 RBC/hpf   WBC, UA 0-5 0 - 5 WBC/hpf   Bacteria, UA MANY (A) NONE SEEN   Squamous Epithelial / LPF 0-5 0 - 5    Imaging:  No results found.  MAU Course: Orders Placed This Encounter  Procedures  . Urinalysis, Routine w reflex microscopic  . Discharge patient   Meds ordered this encounter  Medications  . acetaminophen (TYLENOL) tablet 1,000 mg    MDM: Reactive NST No ctx on monitor Abdomen soft/non tender Cervix closed/thick  Assessment: 1. Abdominal cramping affecting pregnancy   2. [redacted] weeks gestation of pregnancy     Plan: Discharge home in stable condition.  Preterm Labor precautions and fetal kick counts Follow-up Information    [facility]   Why: keep scheduled appointment or call office as needed Contact information: [address] [place] [address] [phone]        [facility] Follow up.   Specialty: Obstetrics and Gynecology Why: return for worsening symptoms Contact information: [address] [id number] [place] [id number] [phone]          Allergies as of 07/21/2018      Reactions   Peanut Oil Hives   Peanut-containing Drug Products       Medication List    TAKE these medications   acetaminophen 500 MG tablet Commonly known as: TYLENOL Take 1,000 mg by mouth every 6 (six) hours as needed.   Comfort Fit Maternity Supp Med Misc Wear belt during the day, as needed, and remove at night prior to bedtime.    famotidine 40 MG tablet Commonly known as: PEPCID Take 1 tablet (40 mg total) by mouth daily.   multivitamin-prenatal 27-0.8 MG Tabs tablet Take 1 tablet by mouth daily at 12 noon.   Proventil HFA 108 (90 Base) MCG/ACT inhaler Generic drug: albuterol Inhale 2 puffs into the lungs as needed.       Judeth HornLawrence, Sharmarke Cicio, NP 07/21/2018  4:30 AM

## 2018-08-01 ENCOUNTER — Inpatient Hospital Stay (HOSPITAL_COMMUNITY)
Admission: AD | Admit: 2018-08-01 | Discharge: 2018-08-02 | Disposition: A | Discharge status: Home / Self Care | Payer: BC Managed Care – PPO | Source: Ambulatory Visit | Attending: Obstetrics and Gynecology | Admitting: Obstetrics and Gynecology

## 2018-08-01 ENCOUNTER — Other Ambulatory Visit: Payer: Self-pay

## 2018-08-01 ENCOUNTER — Encounter (HOSPITAL_COMMUNITY): Payer: Self-pay

## 2018-08-01 DIAGNOSIS — M545 Low back pain: Secondary | ICD-10-CM | POA: Insufficient documentation

## 2018-08-01 DIAGNOSIS — O26893 Other specified pregnancy related conditions, third trimester: Secondary | ICD-10-CM | POA: Diagnosis not present

## 2018-08-01 DIAGNOSIS — R109 Unspecified abdominal pain: Secondary | ICD-10-CM

## 2018-08-01 DIAGNOSIS — Z3A32 32 weeks gestation of pregnancy: Secondary | ICD-10-CM

## 2018-08-01 DIAGNOSIS — O26899 Other specified pregnancy related conditions, unspecified trimester: Secondary | ICD-10-CM

## 2018-08-01 LAB — URINALYSIS, ROUTINE W REFLEX MICROSCOPIC
Bilirubin Urine: NEGATIVE
Glucose, UA: 50 mg/dL — AB
Hgb urine dipstick: NEGATIVE
Ketones, ur: 5 mg/dL — AB
Leukocytes,Ua: NEGATIVE
Nitrite: NEGATIVE
Protein, ur: NEGATIVE mg/dL
Specific Gravity, Urine: 1.023 (ref 1.005–1.030)
pH: 6 (ref 5.0–8.0)

## 2018-08-01 MED ORDER — COMFORT FIT MATERNITY SUPP LG MISC: 1.0000 [IU] | Freq: Every day | 0 refills | Status: DC

## 2018-08-01 MED ORDER — ACETAMINOPHEN 500 MG PO TABS
1000.0000 mg | ORAL_TABLET | Freq: Once | ORAL | Status: AC
  Administered 2018-08-01: 1000 mg via ORAL
  Filled 2018-08-01: qty 2

## 2018-08-01 NOTE — MAU Provider Note (Signed)
Chief Complaint:  Back Pain and Pelvic Pain   First Provider Initiated Contact with Patient 08/01/18 2312     HPI: Paige FavorsKeziah R Blanchard is a 23 y.o. G2P0010 at 1767w5d who presents to maternity admissions reporting abdominal cramping & low back pain. Reports constant low back pain that hasn't gotten worse with the pregnancy. Was previously prescribe a maternity support belt & [facility] but was told they didn't have her size.  Reports intermittent lower abdominal pain & tightening since having intercourse this morning. Was every 5 minutes at home. Has decreased since arrival. Also had 1 episode of light spotting this morning after intercourse. No bleeding since then. No LOF. Good fetal movement.    Location: low back & abdomen Quality: cramping & aching Severity: 6/10 in pain scale Duration: 1 day Timing: back pain constant. abd pain intermittent Modifying factors: back pain worse with standing & walking, hasn't treated symptoms Associated signs and symptoms: none   Past Medical History:  Diagnosis Date  . Dysmenorrhea   . Migraine with aura   . Vitamin D deficiency    OB History  Gravida Para Term Preterm AB Living  2 0 0 0 1 0  SAB TAB Ectopic Multiple Live Births  1 0 0 0 0    # Outcome Date GA Lbr Len/2nd Weight Sex Delivery Anes PTL Lv  2 Current           1 SAB            Past Surgical History:  Procedure Laterality Date  . abcessed tooth  03/14/2017   drained in ED of [facility]  . HERNIA REPAIR     AGE 23  . TONSILLECTOMY AND ADENOIDECTOMY    . TUBES IN EAR    . WISDOM TOOTH EXTRACTION  01/2017   Family History  Problem Relation Age of Onset  . Diabetes Mother   . Hypertension Mother   . Heart disease Father   . Diabetes Maternal Grandmother        TYPE 2  . Diabetes Maternal Grandfather        TYPE 2  . Breast cancer Other   . Cancer Other        PROSTATE   Social History   Tobacco Use  . Smoking status: Never Smoker  . Smokeless tobacco: Never  Used  Substance Use Topics  . Alcohol use: No    Frequency: Never  . Drug use: No   Allergies  Allergen Reactions  . Peanut Oil Hives  . Peanut-Containing Drug Products    Medications Prior to Admission  Medication Sig Dispense Refill Last Dose  . acetaminophen (TYLENOL) 500 MG tablet Take 1,000 mg by mouth every 6 (six) hours as needed.     Marland Kitchen. albuterol (PROVENTIL HFA) 108 (90 Base) MCG/ACT inhaler Inhale 2 puffs into the lungs as needed.     . Elastic Bandages & Supports (COMFORT FIT MATERNITY SUPP MED) MISC Wear belt during the day, as needed, and remove at night prior to bedtime. 1 each 0   . famotidine (PEPCID) 40 MG tablet Take 1 tablet (40 mg total) by mouth daily. 30 tablet 1   . Prenatal Vit-Fe Fumarate-FA (MULTIVITAMIN-PRENATAL) 27-0.8 MG TABS tablet Take 1 tablet by mouth daily at 12 noon.       I have reviewed patient's Past Medical Hx, Surgical Hx, Family Hx, Social Hx, medications and allergies.   ROS:  Review of Systems  Constitutional: Negative.   Gastrointestinal:  Positive for abdominal pain.  Genitourinary: Negative.   Musculoskeletal: Positive for back pain.    Physical Exam   Patient Vitals for the past 24 hrs:  BP Temp Temp src Pulse Resp SpO2 Height Weight  08/01/18 2305 119/80 - - (!) 109 - - - -  08/01/18 2243 (!) 114/55 98.3 F (36.8 C) Oral (!) 114 19 98 % 5\' 4"  (1.626 m) (!) 144.6 kg    Constitutional: Well-developed, well-nourished female in no acute distress.  Cardiovascular: normal rate & rhythm, no murmur Respiratory: normal effort, lung sounds clear throughout GI: Abd soft, non-tender, gravid appropriate for gestational age. Pos BS x 4 MS: Extremities nontender, no edema, normal ROM Neurologic: Alert and oriented x 4.  GU:   No blood  Dilation: Closed Effacement (%): Thick Station: Ballotable Exam by:: Estanislado SpireE. Nedda Gains, NP  NST:  Baseline: 150 bpm, Variability: Good {> 6 bpm), Accelerations: Reactive and Decelerations: Absent    Labs: Results for orders placed or performed during the hospital encounter of 08/01/18 (from the past 24 hour(s))  Urinalysis, Routine w reflex microscopic     Status: Abnormal   Collection Time: 08/01/18 11:17 PM  Result Value Ref Range   Color, Urine YELLOW YELLOW   APPearance CLEAR CLEAR   Specific Gravity, Urine 1.023 1.005 - 1.030   pH 6.0 5.0 - 8.0   Glucose, UA 50 (A) NEGATIVE mg/dL   Hgb urine dipstick NEGATIVE NEGATIVE   Bilirubin Urine NEGATIVE NEGATIVE   Ketones, ur 5 (A) NEGATIVE mg/dL   Protein, ur NEGATIVE NEGATIVE mg/dL   Nitrite NEGATIVE NEGATIVE   Leukocytes,Ua NEGATIVE NEGATIVE    Imaging:  No results found.  MAU Course: Orders Placed This Encounter  Procedures  . Urinalysis, Routine w reflex microscopic  . Discharge patient   Meds ordered this encounter  Medications  . acetaminophen (TYLENOL) tablet 1,000 mg  . Elastic Bandages & Supports (COMFORT FIT MATERNITY SUPP LG) MISC    Sig: 1 Units by Does not apply route daily.    Dispense:  1 each    Refill:  0    Order Specific Question:   Supervising Provider    Answer:   [place] BingPICKENS, CHARLIE [[id number]]    MDM: Reactive NST Abdomen soft & non tender. Some irregular UI on monitor. Cervix closed/thick.  RH positive. Reports some light spotting after intercourse this morning. No blood on exam this evening.   [facility] rx & [facility]   Assessment: 1. Low back pain during pregnancy in third trimester   2. [redacted] weeks gestation of pregnancy   3. Abdominal cramping affecting pregnancy     Plan: Discharge home in stable condition.  Preterm Labor precautions and fetal kick counts Follow-up Information    [facility]   Why: keep scheduled appointment or call as needed Contact information: [address] [place] [address] [phone]        [facility] Follow up.   Specialty: Obstetrics and Gynecology Why:  return for worsening symptoms Contact information: [address] [id number] [place] [id number] [phone]          Allergies as of 08/01/2018      Reactions   Peanut Oil Hives   Peanut-containing Drug Products       Medication List    TAKE these medications   acetaminophen 500 MG tablet Commonly known as: TYLENOL Take 1,000 mg by mouth every 6 (  six) hours as needed.   Comfort Fit Maternity Supp Lg Misc 1 Units by Does not apply route daily. What changed:   how much to take  how to take this  when to take this  additional instructions   famotidine 40 MG tablet Commonly known as: PEPCID Take 1 tablet (40 mg total) by mouth daily.   multivitamin-prenatal 27-0.8 MG Tabs tablet Take 1 tablet by mouth daily at 12 noon.   Proventil HFA 108 (90 Base) MCG/ACT inhaler Generic drug: albuterol Inhale 2 puffs into the lungs as needed.       Jorje Guild, NP 08/01/2018 11:53 PM

## 2018-08-01 NOTE — MAU Note (Signed)
Pt reports constant lower back pain that started around 8:30pm and tightness in her stomach. Feels like her stomach is rock hard. Feels a lot of pressure in lower abdominal. States she has tried stretching back out. Pt denies LOF. Reports she had a small amount of blood when she wiped this morning, but none since. States she had intercourse prior to the spotting. Reports good fetal movement.

## 2018-08-01 NOTE — Discharge Instructions (Signed)
PREGNANCY SUPPORT BELT: You are not alone, Seventy-five percent of women have some sort of abdominal or back pain at some point in their pregnancy. Your baby is growing at a fast pace, which means that your whole body is rapidly trying to adjust to the changes. As your uterus grows, your back may start feeling a bit under stress and this can result in back or abdominal pain that can go from mild, and therefore bearable, to severe pains that will not allow you to sit or lay down comfortably, When it comes to dealing with pregnancy-related pains and cramps, some pregnant women usually prefer natural remedies, which the market is filled with nowadays. For example, wearing a pregnancy support belt can help ease and lessen your discomfort and pain. WHAT ARE THE BENEFITS OF WEARING A PREGNANCY SUPPORT BELT? A pregnancy support belt provides support to the lower portion of the belly taking some of the weight of the growing uterus and distributing to the other parts of your body. It is designed make you comfortable and gives you extra support. Over the years, the pregnancy apparel market has been studying the needs and wants of pregnant women and they have come up with the most comfortable pregnancy support belts that woman could ever ask for. In fact, you will no longer have to wear a stretched-out or bulky pregnancy belt that is visible underneath your clothes and makes you feel even more uncomfortable. Nowadays, a pregnancy support belt is made of comfortable and stretchy materials that will not irritate your skin but will actually make you feel at ease and you will not even notice you are wearing it. They are easy to put on and adjust during the day and can be worn at night for additional support.  BENEFITS:  Relives Back pain  Relieves Abdominal Muscle and Leg Pain  Stabilizes the Pelvic Ring  Offers a Cushioned Abdominal Lift Pad  Relieves pressure on the Sciatic Nerve Within Minutes WHERE TO GET  YOUR PREGNANCY BELT: [employer] [phone] [address]  [place],  [id number]      Back Pain in Pregnancy Back pain during pregnancy is common. Back pain may be caused by several factors that are related to changes during your pregnancy. Follow these instructions at home: Managing pain, stiffness, and swelling      If directed, for sudden (acute) back pain, put ice on the painful area. ? Put ice in a plastic bag. ? Place a towel between your skin and the bag. ? Leave the ice on for 20 minutes, 2-3 times per day.  If directed, apply heat to the affected area before you exercise. Use the heat source that your health care provider recommends, such as a moist heat pack or a heating pad. ? Place a towel between your skin and the heat source. ? Leave the heat on for 20-30 minutes. ? Remove the heat if your skin turns bright red. This is especially important if you are unable to feel pain, heat, or cold. You may have a greater risk of getting burned.  If directed, massage the affected area. Activity  Exercise as told by your health care provider. Gentle exercise is the best way to prevent or manage back pain.  Listen to your body when lifting. If lifting hurts, ask for help or bend your knees. This uses your leg muscles instead of your back muscles.  Squat down when picking up something from the floor. Do not bend over.  bed rest for short periods as told by your health care provider. Bed rest should only be used for the most severe episodes of back pain. °Standing, sitting, and lying down °· Do not stand in one place for long periods of time. °· Use good posture when sitting. Make sure your head rests over your shoulders and is not hanging forward. Use a pillow on your lower back if necessary. °· Try sleeping on your side, preferably the left side, with a pregnancy support pillow or 1-2 regular pillows between your legs. °? If you have back pain after a  night's rest, your bed may be too soft. °? A firm mattress may provide more support for your back during pregnancy. °General instructions °· Do not wear high heels. °· Eat a healthy diet. Try to gain weight within your health care provider's recommendations. °· Use a maternity girdle, elastic sling, or back brace as told by your health care provider. °· Take over-the-counter and prescription medicines only as told by your health care provider. °· Work with a physical therapist or massage therapist to find ways to manage back pain. Acupuncture or massage therapy may be helpful. °· Keep all follow-up visits as told by your health care provider. This is important. °Contact a health care provider if: °· Your back pain interferes with your daily activities. °· You have increasing pain in other parts of your body. °Get help right away if: °· You develop numbness, tingling, weakness, or problems with the use of your arms or legs. °· You develop severe back pain that is not controlled with medicine. °· You have a change in bowel or bladder control. °· You develop shortness of breath, dizziness, or you faint. °· You develop nausea, vomiting, or sweating. °· You have back pain that is a rhythmic, cramping pain similar to labor pains. Labor pain is usually 1-2 minutes apart, lasts for about 1 minute, and involves a bearing down feeling or pressure in your pelvis. °· You have back pain and your water breaks or you have vaginal bleeding. °· You have back pain or numbness that travels down your leg. °· Your back pain developed after you fell. °· You develop pain on one side of your back. °· You see blood in your urine. °· You develop skin blisters in the area of your back pain. °Summary °· Back pain may be caused by several factors that are related to changes during your pregnancy. °· Follow instructions as told by your health care provider for managing pain, stiffness, and swelling. °· Exercise as told by your health care  provider. Gentle exercise is the best way to prevent or manage back pain. °· Take over-the-counter and prescription medicines only as told by your health care provider. °· Keep all follow-up visits as told by your health care provider. This is important. °This information is not intended to replace advice given to you by your health care provider. Make sure you discuss any questions you have with your health care provider. °Document Released: 04/01/2005 Document Revised: 04/12/2018 Document Reviewed: 06/09/2017 °Elsevier Patient Education © 2020 [employer] ° °

## 2018-08-03 ENCOUNTER — Inpatient Hospital Stay (HOSPITAL_COMMUNITY)
Admission: AD | Admit: 2018-08-03 | Discharge: 2018-08-04 | Disposition: A | Discharge status: Home / Self Care | Payer: BC Managed Care – PPO | Attending: Obstetrics and Gynecology | Admitting: Obstetrics and Gynecology

## 2018-08-03 ENCOUNTER — Other Ambulatory Visit: Payer: Self-pay

## 2018-08-03 ENCOUNTER — Encounter (HOSPITAL_COMMUNITY): Payer: Self-pay

## 2018-08-03 DIAGNOSIS — O368130 Decreased fetal movements, third trimester, not applicable or unspecified: Secondary | ICD-10-CM | POA: Insufficient documentation

## 2018-08-03 DIAGNOSIS — Z3A33 33 weeks gestation of pregnancy: Secondary | ICD-10-CM | POA: Insufficient documentation

## 2018-08-03 NOTE — Discharge Instructions (Signed)
Third Trimester of Pregnancy The third trimester is from week 28 through week 40 (months 7 through 9). The third trimester is a time when the unborn baby (fetus) is growing rapidly. At the end of the ninth month, the fetus is about 20 inches in length and weighs 6-10 pounds. Body changes during your third trimester Your body will continue to go through many changes during pregnancy. The changes vary from woman to woman. During the third trimester:  Your weight will continue to increase. You can expect to gain 25-35 pounds (11-16 kg) by the end of the pregnancy.  You may begin to get stretch marks on your hips, abdomen, and breasts.  You may urinate more often because the fetus is moving lower into your pelvis and pressing on your bladder.  You may develop or continue to have heartburn. This is caused by increased hormones that slow down muscles in the digestive tract.  You may develop or continue to have constipation because increased hormones slow digestion and cause the muscles that push waste through your intestines to relax.  You may develop hemorrhoids. These are swollen veins (varicose veins) in the rectum that can itch or be painful.  You may develop swollen, bulging veins (varicose veins) in your legs.  You may have increased body aches in the pelvis, back, or thighs. This is due to weight gain and increased hormones that are relaxing your joints.  You may have changes in your hair. These can include thickening of your hair, rapid growth, and changes in texture. Some women also have hair loss during or after pregnancy, or hair that feels dry or thin. Your hair will most likely return to normal after your baby is born.  Your breasts will continue to grow and they will continue to become tender. A yellow fluid (colostrum) may leak from your breasts. This is the first milk you are producing for your baby.  Your belly button may stick out.  You may notice more swelling in your hands,  face, or ankles.  You may have increased tingling or numbness in your hands, arms, and legs. The skin on your belly may also feel numb.  You may feel short of breath because of your expanding uterus.  You may have more problems sleeping. This can be caused by the size of your belly, increased need to urinate, and an increase in your body's metabolism.  You may notice the fetus "dropping," or moving lower in your abdomen (lightening).  You may have increased vaginal discharge.  You may notice your joints feel loose and you may have pain around your pelvic bone. What to expect at prenatal visits You will have prenatal exams every 2 weeks until week 36. Then you will have weekly prenatal exams. During a routine prenatal visit:  You will be weighed to make sure you and the baby are growing normally.  Your blood pressure will be taken.  Your abdomen will be measured to track your baby's growth.  The fetal heartbeat will be listened to.  Any test results from the previous visit will be discussed.  You may have a cervical check near your due date to see if your cervix has softened or thinned (effaced).  You will be tested for Group B streptococcus. This happens between 35 and 37 weeks. Your health care provider may ask you:  What your birth plan is.  How you are feeling.  If you are feeling the baby move.  If you have had any abnormal   symptoms, such as leaking fluid, bleeding, severe headaches, or abdominal cramping.  If you are using any tobacco products, including cigarettes, chewing tobacco, and electronic cigarettes.  If you have any questions. Other tests or screenings that may be performed during your third trimester include:  Blood tests that check for low iron levels (anemia).  Fetal testing to check the health, activity level, and growth of the fetus. Testing is done if you have certain medical conditions or if there are problems during the pregnancy.  Nonstress test  (NST). This test checks the health of your baby to make sure there are no signs of problems, such as the baby not getting enough oxygen. During this test, a belt is placed around your belly. The baby is made to move, and its heart rate is monitored during movement. What is false labor? False labor is a condition in which you feel small, irregular tightenings of the muscles in the womb (contractions) that usually go away with rest, changing position, or drinking water. These are called Braxton Hicks contractions. Contractions may last for hours, days, or even weeks before true labor sets in. If contractions come at regular intervals, become more frequent, increase in intensity, or become painful, you should see your health care provider. What are the signs of labor?  Abdominal cramps.  Regular contractions that start at 10 minutes apart and become stronger and more frequent with time.  Contractions that start on the top of the uterus and spread down to the lower abdomen and back.  Increased pelvic pressure and dull back pain.  A watery or bloody mucus discharge that comes from the vagina.  Leaking of amniotic fluid. This is also known as your "water breaking." It could be a slow trickle or a gush. Let your health care provider know if it has a color or strange odor. If you have any of these signs, call your health care provider right away, even if it is before your due date. Follow these instructions at home: Medicines  Follow your health care provider's instructions regarding medicine use. Specific medicines may be either safe or unsafe to take during pregnancy.  Take a prenatal vitamin that contains at least 600 micrograms (mcg) of folic acid.  If you develop constipation, try taking a stool softener if your health care provider approves. Eating and drinking   Eat a balanced diet that includes fresh fruits and vegetables, whole grains, good sources of protein such as meat, eggs, or tofu,  and low-fat dairy. Your health care provider will help you determine the amount of weight gain that is right for you.  Avoid raw meat and uncooked cheese. These carry germs that can cause birth defects in the baby.  If you have low calcium intake from food, talk to your health care provider about whether you should take a daily calcium supplement.  Eat four or five small meals rather than three large meals a day.  Limit foods that are high in fat and processed sugars, such as fried and sweet foods.  To prevent constipation: ? Drink enough fluid to keep your urine clear or pale yellow. ? Eat foods that are high in fiber, such as fresh fruits and vegetables, whole grains, and beans. Activity  Exercise only as directed by your health care provider. Most women can continue their usual exercise routine during pregnancy. Try to exercise for 30 minutes at least 5 days a week. Stop exercising if you experience uterine contractions.  Avoid heavy lifting.  Do   not exercise in extreme heat or humidity, or at high altitudes.  Wear low-heel, comfortable shoes.  Practice good posture.  You may continue to have sex unless your health care provider tells you otherwise. Relieving pain and discomfort  Take frequent breaks and rest with your legs elevated if you have leg cramps or low back pain.  Take warm sitz baths to soothe any pain or discomfort caused by hemorrhoids. Use hemorrhoid cream if your health care provider approves.  Wear a good support bra to prevent discomfort from breast tenderness.  If you develop varicose veins: ? Wear support pantyhose or compression stockings as told by your healthcare provider. ? Elevate your feet for 15 minutes, 3-4 times a day. Prenatal care  Write down your questions. Take them to your prenatal visits.  Keep all your prenatal visits as told by your health care provider. This is important. Safety  Wear your seat belt at all times when driving.  Make  a list of emergency phone numbers, including numbers for family, friends, the hospital, and police and fire departments. General instructions  Avoid cat litter boxes and soil used by cats. These carry germs that can cause birth defects in the baby. If you have a cat, ask someone to clean the litter box for you.  Do not travel far distances unless it is absolutely necessary and only with the approval of your health care provider.  Do not use hot tubs, steam rooms, or saunas.  Do not drink alcohol.  Do not use any products that contain nicotine or tobacco, such as cigarettes and e-cigarettes. If you need help quitting, ask your health care provider.  Do not use any medicinal herbs or unprescribed drugs. These chemicals affect the formation and growth of the baby.  Do not douche or use tampons or scented sanitary pads.  Do not cross your legs for long periods of time.  To prepare for the arrival of your baby: ? Take prenatal classes to understand, practice, and ask questions about labor and delivery. ? Make a trial run to the hospital. ? Visit the hospital and tour the maternity area. ? Arrange for maternity or paternity leave through employers. ? Arrange for family and friends to take care of pets while you are in the hospital. ? Purchase a rear-facing car seat and make sure you know how to install it in your car. ? Pack your hospital bag. ? Prepare the baby's nursery. Make sure to remove all pillows and stuffed animals from the baby's crib to prevent suffocation.  Visit your dentist if you have not gone during your pregnancy. Use a soft toothbrush to brush your teeth and be gentle when you floss. Contact a health care provider if:  You are unsure if you are in labor or if your water has broken.  You become dizzy.  You have mild pelvic cramps, pelvic pressure, or nagging pain in your abdominal area.  You have lower back pain.  You have persistent nausea, vomiting, or diarrhea.   You have an unusual or bad smelling vaginal discharge.  You have pain when you urinate. Get help right away if:  Your water breaks before 37 weeks.  You have regular contractions less than 5 minutes apart before 37 weeks.  You have a fever.  You are leaking fluid from your vagina.  You have spotting or bleeding from your vagina.  You have severe abdominal pain or cramping.  You have rapid weight loss or weight gain.  You have   shortness of breath with chest pain.  You notice sudden or extreme swelling of your face, hands, ankles, feet, or legs.  Your baby makes fewer than 10 movements in 2 hours.  You have severe headaches that do not go away when you take medicine.  You have vision changes. Summary  The third trimester is from week 28 through week 40, months 7 through 9. The third trimester is a time when the unborn baby (fetus) is growing rapidly.  During the third trimester, your discomfort may increase as you and your baby continue to gain weight. You may have abdominal, leg, and back pain, sleeping problems, and an increased need to urinate.  During the third trimester your breasts will keep growing and they will continue to become tender. A yellow fluid (colostrum) may leak from your breasts. This is the first milk you are producing for your baby.  False labor is a condition in which you feel small, irregular tightenings of the muscles in the womb (contractions) that eventually go away. These are called Braxton Hicks contractions. Contractions may last for hours, days, or even weeks before true labor sets in.  Signs of labor can include: abdominal cramps; regular contractions that start at 10 minutes apart and become stronger and more frequent with time; watery or bloody mucus discharge that comes from the vagina; increased pelvic pressure and dull back pain; and leaking of amniotic fluid. This information is not intended to replace advice given to you by your health  care provider. Make sure you discuss any questions you have with your health care provider. Document Released: 12/16/2000 Document Revised: 04/14/2018 Document Reviewed: 01/28/2016 Elsevier Patient Education  2020 [employer]  

## 2018-08-03 NOTE — MAU Note (Signed)
Pt states she has had no fetal movement since 1700. At 1700 the pt only felt a slight kick. Pt states she felt not fetal movement yesterday.   Pt reports being seen in the office today and was told that the umbilical cord is around the baby's neck.   Denies vaginal bleeding or LOF.

## 2018-08-03 NOTE — MAU Provider Note (Signed)
History     CSN: [id number]  Arrival date and time: 08/03/18 2142   First Provider Initiated Contact with Patient 08/03/18 2243      Chief Complaint  Patient presents with  . Decreased Fetal Movement   Paige Blanchard is a 23 y.o. G2P0010 at [redacted]w[redacted]d who receives care at [facility].  She presents today for Decreased Fetal Movement.  She states she was seen in the office today and had a BPP and was told that the cord is around infant neck.  Patient states she was instructed to contact office and/or report to MAU for lack of fetal movement.  Patient states she has eaten, rested, drank cold fluids, and shined lights on her abdomen with no fetal movement felt.  Patient states that she did not feel fetal movement during her BPP although she was told movement was seen.  Patient acknowledges that she has had difficulty, throughout the pregnancy, with perception of fetal movement.  Patient denies perception of fetal movement since arrival to the MAU.      OB History    Gravida  2   Para  0   Term  0   Preterm  0   AB  1   Living  0     SAB  1   TAB  0   Ectopic  0   Multiple  0   Live Births  0           Past Medical History:  Diagnosis Date  . Dysmenorrhea   . Migraine with aura   . Vitamin D deficiency     Past Surgical History:  Procedure Laterality Date  . abcessed tooth  03/14/2017   drained in ED of [facility]  . HERNIA REPAIR     AGE 79  . TONSILLECTOMY AND ADENOIDECTOMY    . TUBES IN EAR    . WISDOM TOOTH EXTRACTION  01/2017    Family History  Problem Relation Age of Onset  . Diabetes Mother   . Hypertension Mother   . Heart disease Father   . Diabetes Maternal Grandmother        TYPE 2  . Diabetes Maternal Grandfather        TYPE 2  . Breast cancer Other   . Cancer Other        PROSTATE    Social History   Tobacco Use  . Smoking status: Never Smoker  . Smokeless tobacco: Never Used  Substance Use Topics  . Alcohol use: No    Frequency:  Never  . Drug use: No    Allergies:  Allergies  Allergen Reactions  . Peanut Oil Hives  . Peanut-Containing Drug Products     Medications Prior to Admission  Medication Sig Dispense Refill Last Dose  . acetaminophen (TYLENOL) 500 MG tablet Take 1,000 mg by mouth every 6 (six) hours as needed.   08/02/2018 at Unknown time  . aspirin EC 81 MG tablet Take 81 mg by mouth daily.   08/02/2018 at Unknown time  . Prenatal Vit-Fe Fumarate-FA (MULTIVITAMIN-PRENATAL) 27-0.8 MG TABS tablet Take 1 tablet by mouth daily at 12 noon.   08/03/2018 at Unknown time  . albuterol (PROVENTIL HFA) 108 (90 Base) MCG/ACT inhaler Inhale 2 puffs into the lungs as needed.   More than a month at Unknown time  . Elastic Bandages & Supports (COMFORT FIT MATERNITY SUPP LG) MISC 1 Units by Does not apply route daily. 1 each 0   . famotidine (PEPCID)  40 MG tablet Take 1 tablet (40 mg total) by mouth daily. 30 tablet 1     Review of Systems  Constitutional: Negative for chills and fever.  Respiratory: Negative for cough and shortness of breath.   Gastrointestinal: Negative for abdominal pain, constipation, diarrhea, nausea and vomiting.  Genitourinary: Positive for vaginal discharge ("The normal kind"). Negative for difficulty urinating, dysuria and vaginal bleeding.  Musculoskeletal: Positive for back pain.  Neurological: Negative for dizziness, light-headedness and headaches.   Physical Exam   Blood pressure (!) 99/56, pulse 91, temperature 98.4 F (36.9 C), resp. rate 20, weight (!) 147.1 kg, last menstrual period 12/15/2017, SpO2 96 %.  Physical Exam  Constitutional: She is oriented to person, place, and time. She appears well-developed and well-nourished. No distress.  HENT:  Head: Normocephalic and atraumatic.  Eyes: Conjunctivae are normal.  Neck: Normal range of motion.  Cardiovascular: Normal rate.  Respiratory: Effort normal.  GI: Soft.  Musculoskeletal: Normal range of motion.  Neurological: She is  alert and oriented to person, place, and time.  Skin: Skin is warm and dry.  Psychiatric: She has a normal mood and affect. Her behavior is normal.    Fetal Assessment 145 bpm, Mod Var, -Decels, +Accels Toco: None graphed  MAU Course  No results found for this or any previous visit (from the past 24 hour(s)). No results found.  MDM PE Labs: None EFM  Assessment and Plan  23 year old G2P0010  SIUP at 33weeks Cat I FT Decreased Fetal Movement  -Extensive discussion regarding nuchal cord prevalence during deliveries.  Reassurances given that this is a normal occurrence and not indicative of impending negative outcomes. -Reiterated primary MDs recommendation to seek out care if no movement perceived. -Discussed discontinuation of home doppler that was purchased from [employer].  -Reassured that fetal heart rate looks well and that movement heard by provider. -NST reactive, but will continue to monitor for patient reassurance.  Reassessment (11:51 PM) 145 bpm, Mod Var, -Decels, +Accels  Cat I FT NST Remains Reactive  Will discharge to home with precautions.   Cherre RobinsJessica L Sharif Rendell MSN, CNM 08/03/2018, 10:43 PM

## 2018-08-04 DIAGNOSIS — Z3A33 33 weeks gestation of pregnancy: Secondary | ICD-10-CM | POA: Diagnosis not present

## 2018-08-04 DIAGNOSIS — O368130 Decreased fetal movements, third trimester, not applicable or unspecified: Secondary | ICD-10-CM | POA: Diagnosis not present

## 2018-08-08 ENCOUNTER — Encounter (HOSPITAL_COMMUNITY): Payer: Self-pay

## 2018-08-08 ENCOUNTER — Inpatient Hospital Stay (HOSPITAL_BASED_OUTPATIENT_CLINIC_OR_DEPARTMENT_OTHER): Payer: BC Managed Care – PPO

## 2018-08-08 ENCOUNTER — Other Ambulatory Visit: Payer: Self-pay

## 2018-08-08 ENCOUNTER — Observation Stay (HOSPITAL_COMMUNITY)
Admission: AD | Admit: 2018-08-08 | Discharge: 2018-08-09 | Disposition: A | Discharge status: Home / Self Care | Payer: BC Managed Care – PPO | Attending: Obstetrics | Admitting: Obstetrics

## 2018-08-08 DIAGNOSIS — E669 Obesity, unspecified: Secondary | ICD-10-CM | POA: Insufficient documentation

## 2018-08-08 DIAGNOSIS — Z20828 Contact with and (suspected) exposure to other viral communicable diseases: Secondary | ICD-10-CM | POA: Insufficient documentation

## 2018-08-08 DIAGNOSIS — Z3A33 33 weeks gestation of pregnancy: Secondary | ICD-10-CM

## 2018-08-08 DIAGNOSIS — O368130 Decreased fetal movements, third trimester, not applicable or unspecified: Principal | ICD-10-CM | POA: Insufficient documentation

## 2018-08-08 DIAGNOSIS — O368190 Decreased fetal movements, unspecified trimester, not applicable or unspecified: Secondary | ICD-10-CM | POA: Diagnosis present

## 2018-08-08 LAB — SARS CORONAVIRUS 2 BY RT PCR (HOSPITAL ORDER, PERFORMED IN ~~LOC~~ HOSPITAL LAB): SARS Coronavirus 2: NEGATIVE

## 2018-08-08 MED ORDER — PRENATAL MULTIVITAMIN CH
1.0000 | ORAL_TABLET | Freq: Every day | ORAL | Status: DC
  Administered 2018-08-08 – 2018-08-09 (×2): 1 via ORAL
  Filled 2018-08-08 (×2): qty 1

## 2018-08-08 MED ORDER — CALCIUM CARBONATE ANTACID 500 MG PO CHEW: 2.0000 | CHEWABLE_TABLET | ORAL | Status: DC | PRN

## 2018-08-08 MED ORDER — DOCUSATE SODIUM 100 MG PO CAPS
100.0000 mg | ORAL_CAPSULE | Freq: Every day | ORAL | Status: DC
  Administered 2018-08-08 – 2018-08-09 (×2): 100 mg via ORAL
  Filled 2018-08-08 (×2): qty 1

## 2018-08-08 MED ORDER — ACETAMINOPHEN 325 MG PO TABS
650.0000 mg | ORAL_TABLET | ORAL | Status: DC | PRN
  Administered 2018-08-08: 650 mg via ORAL
  Filled 2018-08-08: qty 2

## 2018-08-08 NOTE — MAU Note (Signed)
Called office. Has not felt baby move today or yesterday, was told to come in.  Denies bleeding or leaking.  Having normal ligament pain.

## 2018-08-08 NOTE — MAU Provider Note (Signed)
History   [id number]   Chief Complaint  Patient presents with  . Decreased Fetal Movement    HPI Paige Blanchard is a 23 y.o. female  G2P0010 here with report of decreased fetal movement since yesterday.  Reports feeling the baby move approximately 0 times in the past 24 hour.  Denies vaginal bleeding or leaking of fluid.  Denies abdominal pain.  States she was told on her last growth ultrasound that the cord was around baby's neck & she needed to be seen anytime there's a decrease in movement.   Patient's last menstrual period was 12/15/2017 (exact date).  OB History  Gravida Para Term Preterm AB Living  2 0 0 0 1 0  SAB TAB Ectopic Multiple Live Births  1 0 0 0 0    # Outcome Date GA Lbr Len/2nd Weight Sex Delivery Anes PTL Lv  2 Current           1 SAB             Past Medical History:  Diagnosis Date  . Dysmenorrhea   . Migraine with aura   . Vitamin D deficiency     Family History  Problem Relation Age of Onset  . Diabetes Mother   . Hypertension Mother   . Heart disease Father   . Diabetes Maternal Grandmother        TYPE 2  . Diabetes Maternal Grandfather        TYPE 2  . Breast cancer Other   . Cancer Other        PROSTATE    Social History   Socioeconomic History  . Marital status: Single    Spouse name: Not on file  . Number of children: 0  . Years of education: Not on file  . Highest education level: Not on file  Occupational History  . Not on file  Social Needs  . Financial resource strain: Not on file  . Food insecurity    Worry: Not on file    Inability: Not on file  . Transportation needs    Medical: Not on file    Non-medical: Not on file  Tobacco Use  . Smoking status: Never Smoker  . Smokeless tobacco: Never Used  Substance and Sexual Activity  . Alcohol use: No    Frequency: Never  . Drug use: No  . Sexual activity: Yes    Birth control/protection: None  Lifestyle  . Physical activity    Days per week: Not on file   Minutes per session: Not on file  . Stress: Not on file  Relationships  . Social Musicianconnections    Talks on phone: Not on file    Gets together: Not on file    Attends religious service: Not on file    Active member of club or organization: Not on file    Attends meetings of clubs or organizations: Not on file    Relationship status: Not on file  Other Topics Concern  . Not on file  Social History Narrative  . Not on file    Allergies  Allergen Reactions  . Peanut Oil Hives  . Peanut-Containing Drug Products     No current facility-administered medications on file prior to encounter.    Current Outpatient Medications on File Prior to Encounter  Medication Sig Dispense Refill  . aspirin EC 81 MG tablet Take 81 mg by mouth daily.    . Prenatal Vit-Fe Fumarate-FA (MULTIVITAMIN-PRENATAL) 27-0.8 MG TABS tablet Take  1 tablet by mouth daily at 12 noon.    Marland Kitchen acetaminophen (TYLENOL) 500 MG tablet Take 1,000 mg by mouth every 6 (six) hours as needed.    Marland Kitchen albuterol (PROVENTIL HFA) 108 (90 Base) MCG/ACT inhaler Inhale 2 puffs into the lungs as needed.    . Elastic Bandages & Supports (COMFORT FIT MATERNITY SUPP LG) MISC 1 Units by Does not apply route daily. 1 each 0     Review of Systems  Constitutional: Negative.   Gastrointestinal: Negative.   Genitourinary: Negative.      Physical Exam   Vitals:   08/08/18 1001 08/08/18 1022 08/08/18 1132  BP: 110/60 101/70   Pulse: (!) 122    Resp: 20    Temp: 98.3 F (36.8 C)    TempSrc: Oral    SpO2: 98%  98%  Weight: (!) 146.2 kg      Physical Exam  Nursing note and vitals reviewed. Constitutional: She appears well-developed and well-nourished. No distress.  GI: Soft. There is no abdominal tenderness.  Skin: Skin is warm and dry. She is not diaphoretic.  Psychiatric: She has a normal mood and affect. Her behavior is normal. Judgment and thought content normal.   NST:  Baseline: 135 bpm, Variability: Good {> 6 bpm),  Accelerations: Reactive and Decelerations: Absent   MAU Course  Procedures No results found for this or any previous visit (from the past 24 hour(s)).  MDM Reactive NST. Fetal movement heard through monitor. Patient reports no movement.  BPP 6/8, off for movement. Normal AFI  C/w Dr. Ilda Basset. Recommends admission for continuous fetal monitoring & repeat BPP.  Dr. Carlis Abbott agrees with plan & will place admission orders.  Assessment and Plan  A: 1. Decreased fetal movement affecting management of pregnancy in third trimester, single or unspecified fetus   2. [redacted] weeks gestation of pregnancy    P: Keep on OBSC unit for continuous fetal monitoring. Repeat BPP   Jorje Guild, NP 08/08/2018 1:17 PM

## 2018-08-08 NOTE — H&P (Signed)
23 y.o. G2P0010 @ [redacted]w[redacted]d presents with decreased fetal movements.  She reports that she has not felt any fetal movement since 9 AM yesterday.  She denies abdominal pain, vaginal bleeding or leakage of fluid.  Her pregnancy has been complicated by significantly difficulty in perceiving fetal movement.  She has been seen multiple times in MAU for DFM.  She has been followed in the office with weekly NSTs for persistently decreased fetal movement.  Most recent growth ultrasound was 08/03/2018 and was normal (EFW 4lb 13oz, 65%, with AFI 15, anterior placenta).   Today, tracing was not continuous initially, but was reassuring.  CNM was able to hear multiple audible fetal movements that the patient was unable to appreciate.  BPP was 6/8, with -2 for movement.  Prior to BPP, NST was reassuring, but not reactive.    Pregnancy otherwise complicated by: 1. Morbid obesity:  Pre-pregnancy BMI 53.  Past Medical History:  Diagnosis Date  . Dysmenorrhea   . Migraine with aura   . Vitamin D deficiency     Past Surgical History:  Procedure Laterality Date  . abcessed tooth  03/14/2017   drained in ED of [facility]  . HERNIA REPAIR     AGE 64  . TONSILLECTOMY AND ADENOIDECTOMY    . TUBES IN EAR    . WISDOM TOOTH EXTRACTION  01/2017    OB History  Gravida Para Term Preterm AB Living  2 0 0 0 1 0  SAB TAB Ectopic Multiple Live Births  1 0 0 0 0    # Outcome Date GA Lbr Len/2nd Weight Sex Delivery Anes PTL Lv  2 Current           1 SAB             Social History   Socioeconomic History  . Marital status: Single    Spouse name: Not on file  . Number of children: 0  . Years of education: Not on file  . Highest education level: Not on file  Occupational History  . Not on file  Social Needs  . Financial resource strain: Not on file  . Food insecurity    Worry: Not on file    Inability: Not on file  . Transportation needs    Medical: Not on file    Non-medical: Not on file  Tobacco Use  . Smoking  status: Never Smoker  . Smokeless tobacco: Never Used  Substance and Sexual Activity  . Alcohol use: No    Frequency: Never  . Drug use: No  . Sexual activity: Yes    Birth control/protection: None  Lifestyle  . Physical activity    Days per week: Not on file    Minutes per session: Not on file  . Stress: Not on file   Peanut oil and Peanut-containing drug products    Prenatal Transfer Tool  Maternal Diabetes: No Genetic Screening: Normal Maternal Ultrasounds/Referrals: Normal Fetal Ultrasounds or other Referrals:  None Maternal Substance Abuse:  No Significant Maternal Medications:  Meds include: Other:  low dose aspirin Significant Maternal Lab Results: None  ABO, Rh: A/Positive/-- (02/18 1513) Antibody: Negative (02/18 1513) Rubella: 4.91 (02/18 1513) RPR: Non Reactive (02/18 1513)  HBsAg: Negative (02/18 1513)  HIV: Non Reactive (02/18 1513)  GBS:   unknown    Vitals:   08/08/18 1420 08/08/18 1557  BP: (!) 130/56 119/71  Pulse: (!) 127 100  Resp: 16 17  Temp: 99.2 F (37.3 C) 98 F (36.7 C)  SpO2:  98%     General:  NAD Abdomen:  soft, gravid, obese SVE:  deferred FHTs:  140s, moderate variability Toco:  quiet   A/P   23 y.o. G2P0010 1997w5d presents with persistently decreased fetal movement and BPP 6/10 Admit to AP for observation per recommendation of MAU attending BPP initially 6/10, -2 for movement, -2 for NST.  Since that time, NST has become reactive.  However, given perception of decreased fetal movement throughout pregnancy, will admit to AP for observation and repeat BPP tomorrow.  Continuous fetal monitoring SCDs  FSR/ vtx/ GBS unknown  Jeremian Whitby GEFFEL Aoi Kouns

## 2018-08-08 NOTE — Progress Notes (Signed)
Patient off the monitor to eat.

## 2018-08-08 NOTE — Progress Notes (Signed)
Pt having difficulty finding a comfortable position with the monitor picking up baby. Pt in semi-fowlers currently. She was having some pain along the right side mid to lower abdomen and was given tylenol and got up to use the restroom. Pt reports it is an intermittent cramping sensation that quickly comes and goes. I was in the room from approx. 2330 until 2350 assessing pt and finding a position that works for mom and able to get FHR. Pt has a difficult body habitus to keep [place] in place. Will continue to monitor.

## 2018-08-09 ENCOUNTER — Observation Stay (HOSPITAL_BASED_OUTPATIENT_CLINIC_OR_DEPARTMENT_OTHER): Payer: BC Managed Care – PPO

## 2018-08-09 ENCOUNTER — Encounter (HOSPITAL_COMMUNITY): Payer: Self-pay | Admitting: Nurse Practitioner

## 2018-08-09 DIAGNOSIS — Z3A33 33 weeks gestation of pregnancy: Secondary | ICD-10-CM | POA: Diagnosis not present

## 2018-08-09 DIAGNOSIS — O368130 Decreased fetal movements, third trimester, not applicable or unspecified: Secondary | ICD-10-CM | POA: Diagnosis not present

## 2018-08-09 DIAGNOSIS — O99213 Obesity complicating pregnancy, third trimester: Secondary | ICD-10-CM | POA: Diagnosis not present

## 2018-08-09 NOTE — Discharge Summary (Signed)
  Patient admitted for observation d/t persistent decreased fetal movement.  She has had difficulty feeling any movement throughout the pregnancy.  On eval in MAU, NST was reassuring but not reactive, BPP performed scored 6/8.  Decision was made to admitted and repeat BPP in am.  That result was 8/8.  FHT has remained reassuring throughout stay, mainly Cat 1.  One 81min decel noted approx 3:30am, that was only  decel noted on review of strip.  Currently reassuring.  Pt will f/u in office for weekly ANT.  Pt has next appt scheduled.

## 2018-08-09 NOTE — Progress Notes (Signed)
Pt on continuous monitoring  Pt s body   Type  Interfering  With monitor  Maintaining  Continuous status   Pt appears fustrated  With  Placement  Being adjusted frequently

## 2018-08-09 NOTE — Progress Notes (Signed)
Dr. Carlis Abbott was called after pt had been uncomfortable and moving around a lot and wanted to stop cont. Monitoring for "awhile". She had asked a few times throughout the night even with explanation as to why continuous monitoring is important. Dr. Carlis Abbott declined to d/c continuous monitoring, do the best we can, and the patient will go for a BPP in the am. Pt was informed of decision and decided to try to stay in the position that the monitor best works in (semi-fowlers). Will continue monitoring as continuous and work with pt.

## 2018-08-18 ENCOUNTER — Encounter (HOSPITAL_COMMUNITY): Payer: Self-pay | Admitting: *Deleted

## 2018-08-18 ENCOUNTER — Inpatient Hospital Stay (HOSPITAL_COMMUNITY)
Admission: AD | Admit: 2018-08-18 | Discharge: 2018-08-18 | Disposition: A | Discharge status: Home / Self Care | Payer: BC Managed Care – PPO | Attending: Obstetrics and Gynecology | Admitting: Obstetrics and Gynecology

## 2018-08-18 ENCOUNTER — Inpatient Hospital Stay (HOSPITAL_BASED_OUTPATIENT_CLINIC_OR_DEPARTMENT_OTHER): Payer: BC Managed Care – PPO

## 2018-08-18 ENCOUNTER — Other Ambulatory Visit: Payer: Self-pay

## 2018-08-18 DIAGNOSIS — O368193 Decreased fetal movements, unspecified trimester, fetus 3: Secondary | ICD-10-CM | POA: Diagnosis not present

## 2018-08-18 DIAGNOSIS — Z3A35 35 weeks gestation of pregnancy: Secondary | ICD-10-CM

## 2018-08-18 DIAGNOSIS — O99213 Obesity complicating pregnancy, third trimester: Secondary | ICD-10-CM | POA: Diagnosis not present

## 2018-08-18 DIAGNOSIS — Z7982 Long term (current) use of aspirin: Secondary | ICD-10-CM | POA: Diagnosis not present

## 2018-08-18 DIAGNOSIS — O368131 Decreased fetal movements, third trimester, fetus 1: Secondary | ICD-10-CM | POA: Diagnosis not present

## 2018-08-18 DIAGNOSIS — Z3689 Encounter for other specified antenatal screening: Secondary | ICD-10-CM

## 2018-08-18 DIAGNOSIS — O368130 Decreased fetal movements, third trimester, not applicable or unspecified: Secondary | ICD-10-CM | POA: Diagnosis present

## 2018-08-18 DIAGNOSIS — O368190 Decreased fetal movements, unspecified trimester, not applicable or unspecified: Secondary | ICD-10-CM

## 2018-08-18 LAB — AMNISURE RUPTURE OF MEMBRANE (ROM) NOT AT ARMC: Amnisure ROM: NEGATIVE

## 2018-08-18 LAB — URINALYSIS, ROUTINE W REFLEX MICROSCOPIC
Bilirubin Urine: NEGATIVE
Glucose, UA: NEGATIVE mg/dL
Hgb urine dipstick: NEGATIVE
Ketones, ur: 5 mg/dL — AB
Nitrite: NEGATIVE
Protein, ur: NEGATIVE mg/dL
Specific Gravity, Urine: 1.006 (ref 1.005–1.030)
pH: 6 (ref 5.0–8.0)

## 2018-08-18 NOTE — MAU Note (Signed)
Talked to doc this morning about no fetal movement and cramping.  Around noon had some leaking of fluid, pants were soaked, and then it stopped.  Still having some cramping and no movement. Hasn't felt any movement since yesterday morning.

## 2018-08-18 NOTE — Discharge Instructions (Signed)

## 2018-08-18 NOTE — MAU Provider Note (Signed)
History     CSN: [id number]  Arrival date and time: 08/18/18 1727   First Provider Initiated Contact with Patient 08/18/18 1809      Chief Complaint  Patient presents with  . Decreased Fetal Movement  . Abdominal Pain  . Rupture of Membranes   HPI  Ms. Paige Blanchard is a 23 y.o. G2P0010 at [redacted]w[redacted]d who presents to MAU today with complaint of no fetal movement since yesterday. The patient had the same complaint last week and had a 6/8 BPP. She was admitted for 24 hour obs and had a repeat BPP in the morning that was 8/8. The patient was discharged home. She states that she was told that an [place] in the office showed a nuchal cord and she should come to MAU anytime she feels decreased movement. She also states cramping since this morning that comes and goes and is rated at 10/10 when present. She denies vaginal bleeding. She had one episode of LOF earlier today and none since then. Fluid was clear.   OB History    Gravida  2   Para  0   Term  0   Preterm  0   AB  1   Living  0     SAB  1   TAB  0   Ectopic  0   Multiple  0   Live Births  0           Past Medical History:  Diagnosis Date  . Dysmenorrhea   . Migraine with aura   . Vitamin D deficiency     Past Surgical History:  Procedure Laterality Date  . abcessed tooth  03/14/2017   drained in ED of [facility]  . HERNIA REPAIR     AGE 35  . TONSILLECTOMY AND ADENOIDECTOMY    . TUBES IN EAR    . WISDOM TOOTH EXTRACTION  01/2017    Family History  Problem Relation Age of Onset  . Diabetes Mother   . Hypertension Mother   . Heart disease Father   . Diabetes Maternal Grandmother        TYPE 2  . Diabetes Maternal Grandfather        TYPE 2  . Breast cancer Other   . Cancer Other        PROSTATE    Social History   Tobacco Use  . Smoking status: Never Smoker  . Smokeless tobacco: Never Used  Substance Use Topics  . Alcohol use: No    Frequency: Never  . Drug use: No    Allergies:  Allergies   Allergen Reactions  . Peanut Oil Hives  . Peanut-Containing Drug Products     Medications Prior to Admission  Medication Sig Dispense Refill Last Dose  . acetaminophen (TYLENOL) 500 MG tablet Take 1,000 mg by mouth every 6 (six) hours as needed.   08/17/2018 at Unknown time  . aspirin EC 81 MG tablet Take 81 mg by mouth daily.   08/17/2018 at Unknown time  . Prenatal Vit-Fe Fumarate-FA (MULTIVITAMIN-PRENATAL) 27-0.8 MG TABS tablet Take 1 tablet by mouth daily at 12 noon.   08/18/2018 at Unknown time  . albuterol (PROVENTIL HFA) 108 (90 Base) MCG/ACT inhaler Inhale 2 puffs into the lungs as needed.   More than a month at Unknown time  . Elastic Bandages & Supports (COMFORT FIT MATERNITY SUPP LG) MISC 1 Units by Does not apply route daily. 1 each 0     Review of Systems  Constitutional: Negative for fever.  Gastrointestinal: Negative for abdominal pain.  Genitourinary: Positive for vaginal discharge. Negative for vaginal bleeding.   Physical Exam   Blood pressure 123/71, pulse (!) 110, temperature 98.1 F (36.7 C), temperature source Oral, resp. rate 18, height 5\' 4"  (1.626 m), weight (!) 146.6 kg, last menstrual period 12/15/2017, SpO2 98 %.  Physical Exam  Nursing note and vitals reviewed. Constitutional: She is oriented to person, place, and time. She appears well-developed and well-nourished. No distress.  HENT:  Head: Normocephalic and atraumatic.  Cardiovascular: Tachycardia present.  Respiratory: Effort normal.  GI: Soft. She exhibits no distension. There is no abdominal tenderness.  Genitourinary: Uterus is enlarged. Cervix exhibits no friability.    Vaginal discharge (small, thin, white) present.     No vaginal bleeding.  No bleeding in the vagina.  Neurological: She is alert and oriented to person, place, and time.  Skin: Skin is warm and dry. No erythema.  Psychiatric: She has a normal mood and affect.     Results for orders placed or performed during the hospital  encounter of 08/18/18 (from the past 24 hour(s))  Amnisure rupture of membrane (rom)not at [facility]     Status: None   Collection Time: 08/18/18  6:44 PM  Result Value Ref Range   Amnisure ROM NEGATIVE   Urinalysis, Routine w reflex microscopic     Status: Abnormal   Collection Time: 08/18/18  7:02 PM  Result Value Ref Range   Color, Urine YELLOW YELLOW   APPearance CLEAR CLEAR   Specific Gravity, Urine 1.006 1.005 - 1.030   pH 6.0 5.0 - 8.0   Glucose, UA NEGATIVE NEGATIVE mg/dL   Hgb urine dipstick NEGATIVE NEGATIVE   Bilirubin Urine NEGATIVE NEGATIVE   Ketones, ur 5 (A) NEGATIVE mg/dL   Protein, ur NEGATIVE NEGATIVE mg/dL   Nitrite NEGATIVE NEGATIVE   Leukocytes,Ua TRACE (A) NEGATIVE   RBC / HPF 0-5 0 - 5 RBC/hpf   WBC, UA 6-10 0 - 5 WBC/hpf   Bacteria, UA MANY (A) NONE SEEN   Squamous Epithelial / LPF 0-5 0 - 5   Mucus PRESENT     Fetal Monitoring: Baseline: 145 bpm Variability: moderate Accelerations: 15 x 15 Decelerations: none Contractions: none   MAU Course  Procedures None  MDM NST today Fern - negative Amnisure ordered - negative Patient continues to state no palpation FM. BPP ordered  BPP 8/8  Assessment and Plan  A: SIUP at 9121w1d Decreased fetal movement Reactive NST  P: Discharge home Patient reassured after US  Preterm labor precautions and kick counts discussed Patient [facility]  Patient may return to MAU as needed or if her condition were to change or worsen  Vonzella NippleJulie Wenzel, PA-C 08/18/2018, 8:45 PM

## 2018-08-19 ENCOUNTER — Encounter (HOSPITAL_COMMUNITY): Payer: Self-pay

## 2018-08-19 ENCOUNTER — Inpatient Hospital Stay (HOSPITAL_COMMUNITY)
Admission: AD | Admit: 2018-08-19 | Discharge: 2018-09-02 | DRG: 807 | Disposition: A | Discharge status: Home / Self Care | Payer: BC Managed Care – PPO | Attending: Obstetrics and Gynecology | Admitting: Obstetrics and Gynecology

## 2018-08-19 ENCOUNTER — Observation Stay (HOSPITAL_BASED_OUTPATIENT_CLINIC_OR_DEPARTMENT_OTHER): Payer: BC Managed Care – PPO

## 2018-08-19 DIAGNOSIS — Z3A35 35 weeks gestation of pregnancy: Secondary | ICD-10-CM

## 2018-08-19 DIAGNOSIS — O99344 Other mental disorders complicating childbirth: Secondary | ICD-10-CM | POA: Diagnosis present

## 2018-08-19 DIAGNOSIS — O99213 Obesity complicating pregnancy, third trimester: Secondary | ICD-10-CM

## 2018-08-19 DIAGNOSIS — Z20828 Contact with and (suspected) exposure to other viral communicable diseases: Secondary | ICD-10-CM | POA: Diagnosis present

## 2018-08-19 DIAGNOSIS — O99214 Obesity complicating childbirth: Secondary | ICD-10-CM | POA: Diagnosis present

## 2018-08-19 DIAGNOSIS — F419 Anxiety disorder, unspecified: Secondary | ICD-10-CM | POA: Diagnosis present

## 2018-08-19 DIAGNOSIS — O368130 Decreased fetal movements, third trimester, not applicable or unspecified: Principal | ICD-10-CM | POA: Diagnosis present

## 2018-08-19 DIAGNOSIS — F329 Major depressive disorder, single episode, unspecified: Secondary | ICD-10-CM | POA: Diagnosis present

## 2018-08-19 DIAGNOSIS — O0990 Supervision of high risk pregnancy, unspecified, unspecified trimester: Secondary | ICD-10-CM

## 2018-08-19 DIAGNOSIS — O26899 Other specified pregnancy related conditions, unspecified trimester: Secondary | ICD-10-CM

## 2018-08-19 DIAGNOSIS — O99210 Obesity complicating pregnancy, unspecified trimester: Secondary | ICD-10-CM

## 2018-08-19 DIAGNOSIS — O403XX0 Polyhydramnios, third trimester, not applicable or unspecified: Secondary | ICD-10-CM | POA: Diagnosis present

## 2018-08-19 LAB — SARS CORONAVIRUS 2 BY RT PCR (HOSPITAL ORDER, PERFORMED IN ~~LOC~~ HOSPITAL LAB): SARS Coronavirus 2: NEGATIVE

## 2018-08-19 LAB — TYPE AND SCREEN
ABO/RH(D): A POS
Antibody Screen: NEGATIVE

## 2018-08-19 MED ORDER — ACETAMINOPHEN 325 MG PO TABS
650.0000 mg | ORAL_TABLET | ORAL | Status: DC | PRN
  Administered 2018-08-29: 650 mg via ORAL
  Filled 2018-08-19 (×2): qty 2

## 2018-08-19 MED ORDER — SODIUM CHLORIDE 0.9 % IV SOLN: 250.0000 mL | INTRAVENOUS | Status: DC | PRN

## 2018-08-19 MED ORDER — DOCUSATE SODIUM 100 MG PO CAPS
100.0000 mg | ORAL_CAPSULE | Freq: Every day | ORAL | Status: DC
  Administered 2018-08-30: 100 mg via ORAL
  Filled 2018-08-19 (×4): qty 1

## 2018-08-19 MED ORDER — CALCIUM CARBONATE ANTACID 500 MG PO CHEW: 2.0000 | CHEWABLE_TABLET | ORAL | Status: DC | PRN

## 2018-08-19 MED ORDER — SODIUM CHLORIDE 0.9% FLUSH: 3.0000 mL | INTRAVENOUS | Status: DC | PRN

## 2018-08-19 MED ORDER — PRENATAL MULTIVITAMIN CH
1.0000 | ORAL_TABLET | Freq: Every day | ORAL | Status: DC
  Administered 2018-08-20 – 2018-08-30 (×11): 1 via ORAL
  Filled 2018-08-19 (×11): qty 1

## 2018-08-19 MED ORDER — ZOLPIDEM TARTRATE 5 MG PO TABS
5.0000 mg | ORAL_TABLET | Freq: Every evening | ORAL | Status: DC | PRN
  Administered 2018-08-20 – 2018-08-21 (×2): 5 mg via ORAL
  Filled 2018-08-19 (×2): qty 1

## 2018-08-19 MED ORDER — SODIUM CHLORIDE 0.9% FLUSH
3.0000 mL | Freq: Two times a day (BID) | INTRAVENOUS | Status: DC
  Administered 2018-08-19 – 2018-08-30 (×20): 3 mL via INTRAVENOUS

## 2018-08-19 NOTE — MAU Provider Note (Signed)
History   [id number]   Chief Complaint  Patient presents with  . Decreased Fetal Movement    HPI Paige Blanchard is a 23 y.o. female  G2P0010 here with report of decreased fetal movement since 3 days ago. States she often has decrease to no movement. Was seen in MAU last night for same & had a BPP of 8/8. Was in the office today; baby was non reactive on the monitor & BPP was 4/8 (off for breathing & tone). Denies contractions, LOF, or vaginal bleeding.  Patient's last menstrual period was 12/15/2017 (exact date).  OB History  Gravida Para Term Preterm AB Living  2 0 0 0 1 0  SAB TAB Ectopic Multiple Live Births  1 0 0 0 0    # Outcome Date GA Lbr Len/2nd Weight Sex Delivery Anes PTL Lv  2 Current           1 SAB             Past Medical History:  Diagnosis Date  . Dysmenorrhea   . Migraine with aura   . Vitamin D deficiency     Family History  Problem Relation Age of Onset  . Diabetes Mother   . Hypertension Mother   . Heart disease Father   . Diabetes Maternal Grandmother        TYPE 2  . Diabetes Maternal Grandfather        TYPE 2  . Breast cancer Other   . Cancer Other        PROSTATE    Social History   Socioeconomic History  . Marital status: Single    Spouse name: Not on file  . Number of children: 0  . Years of education: Not on file  . Highest education level: Not on file  Occupational History  . Not on file  Social Needs  . Financial resource strain: Not on file  . Food insecurity    Worry: Not on file    Inability: Not on file  . Transportation needs    Medical: Not on file    Non-medical: Not on file  Tobacco Use  . Smoking status: Never Smoker  . Smokeless tobacco: Never Used  Substance and Sexual Activity  . Alcohol use: No    Frequency: Never  . Drug use: No  . Sexual activity: Yes    Birth control/protection: None  Lifestyle  . Physical activity    Days per week: Not on file    Minutes per session: Not on file  . Stress:  Not on file  Relationships  . Social Musicianconnections    Talks on phone: Not on file    Gets together: Not on file    Attends religious service: Not on file    Active member of club or organization: Not on file    Attends meetings of clubs or organizations: Not on file    Relationship status: Not on file  Other Topics Concern  . Not on file  Social History Narrative  . Not on file    Allergies  Allergen Reactions  . Peanut Oil Hives  . Peanut-Containing Drug Products     No current facility-administered medications on file prior to encounter.    Current Outpatient Medications on File Prior to Encounter  Medication Sig Dispense Refill  . acetaminophen (TYLENOL) 500 MG tablet Take 1,000 mg by mouth every 6 (six) hours as needed.    Marland Kitchen. albuterol (PROVENTIL HFA) 108 (90 Base) MCG/ACT  inhaler Inhale 2 puffs into the lungs as needed.    Marland Kitchen aspirin EC 81 MG tablet Take 81 mg by mouth daily.    . Elastic Bandages & Supports (COMFORT FIT MATERNITY SUPP LG) MISC 1 Units by Does not apply route daily. 1 each 0  . Prenatal Vit-Fe Fumarate-FA (MULTIVITAMIN-PRENATAL) 27-0.8 MG TABS tablet Take 1 tablet by mouth daily at 12 noon.       Review of Systems  Constitutional: Negative.   Gastrointestinal: Negative.   Genitourinary: Negative.    Physical Exam   Vitals:   08/19/18 1304 08/19/18 1308  BP: 112/67   Pulse:  (!) 109  Resp:  16  Temp:  97.8 F (36.6 C)    Physical Exam  Nursing note and vitals reviewed. Constitutional: She appears well-developed and well-nourished. No distress.  GI: Soft. There is no abdominal tenderness.  Skin: She is not diaphoretic.  Psychiatric: She has a normal mood and affect. Her behavior is normal. Judgment and thought content normal.     NST:  Baseline: 145 bpm, Variability: Good {> 6 bpm), Accelerations: Reactive and Decelerations: Absent  MAU Course  Procedures Results for orders placed or performed during the hospital encounter of 08/18/18  (from the past 24 hour(s))  Amnisure rupture of membrane (rom)not at [facility]     Status: None   Collection Time: 08/18/18  6:44 PM  Result Value Ref Range   Amnisure ROM NEGATIVE   Urinalysis, Routine w reflex microscopic     Status: Abnormal   Collection Time: 08/18/18  7:02 PM  Result Value Ref Range   Color, Urine YELLOW YELLOW   APPearance CLEAR CLEAR   Specific Gravity, Urine 1.006 1.005 - 1.030   pH 6.0 5.0 - 8.0   Glucose, UA NEGATIVE NEGATIVE mg/dL   Hgb urine dipstick NEGATIVE NEGATIVE   Bilirubin Urine NEGATIVE NEGATIVE   Ketones, ur 5 (A) NEGATIVE mg/dL   Protein, ur NEGATIVE NEGATIVE mg/dL   Nitrite NEGATIVE NEGATIVE   Leukocytes,Ua TRACE (A) NEGATIVE   RBC / HPF 0-5 0 - 5 RBC/hpf   WBC, UA 6-10 0 - 5 WBC/hpf   Bacteria, UA MANY (A) NONE SEEN   Squamous Epithelial / LPF 0-5 0 - 5   Mucus PRESENT     MDM Reactive NST in MAU. Patient still does not feel movement.  C/w Dr. Gertie Exon (MFM). Recommends continuous monitoring & repeat BPP in 6 hours. Call MFM if BPP not improved. Recommendation relayed to Dr. Ouida Sills who agrees with plan.   Assessment and Plan  A: 1. [redacted] weeks gestation of pregnancy   2. Decreased fetal movement, third trimester, not applicable or unspecified fetus    P: Observe on OBSC unit Routine orders placed Repeat BPP at 7 pm CEFM/TOCO   Jorje Guild, NP 08/19/2018 2:00 PM

## 2018-08-19 NOTE — MAU Note (Signed)
.   Paige Blanchard is a 23 y.o. at [redacted]w[redacted]d here in MAU reporting: that she was sent over from office today due to non reactive NST and BPP 4/8. Pt was told by Dr Ouida Sills to come here to be evaluated. PT was evaluated in MAU last night for DFM.  Onset of complaint: yesterday Pain score: 10 Vitals:   08/19/18 1304 08/19/18 1308  BP: 112/67   Pulse:  (!) 109  Resp:  16  Temp:  97.8 F (36.6 C)     FHT:145 Lab orders placed from triage:

## 2018-08-20 ENCOUNTER — Encounter (HOSPITAL_COMMUNITY): Payer: Self-pay | Admitting: *Deleted

## 2018-08-20 DIAGNOSIS — O368130 Decreased fetal movements, third trimester, not applicable or unspecified: Secondary | ICD-10-CM | POA: Diagnosis not present

## 2018-08-20 MED ORDER — BETAMETHASONE SOD PHOS & ACET 6 (3-3) MG/ML IJ SUSP
12.0000 mg | INTRAMUSCULAR | Status: AC
  Administered 2018-08-20 – 2018-08-21 (×2): 12 mg via INTRAMUSCULAR
  Filled 2018-08-20 (×2): qty 2

## 2018-08-20 NOTE — Consult Note (Signed)
TELEMEDICINE CONSULTATION  Requesting provider: Freda Munro, MD Date of Service: 08/20/18 Reason for request: Timing of delivery.   Paige Blanchard is a 23 yo G1P0 at 50 w 3 d dated by LMP consistent with a 8 week 4 day exam.  She is being seen in consultation regarding timing of delivery given abnormal antenatal testing.  Paige Blanchard is overall doing well. She denies vaginal bleeding. Loss of fluid or uterine contractions. She has repeated experiences of decreased fetal movements with abnormal BPP with subsequent corrective normal testing. Her NST has been reactive. There is a known nuchal cord- without variable decelerations on NST per Nurse Jess, Obix was not loading for person review.  Low risk Mat 21  She has not received betamethasone.  Her medical history is uncomplicated with exception of a few surgeries as listed below.  Vitals:   08/20/18 0441 08/20/18 0600 08/20/18 0855 08/20/18 0900  BP: 106/63   124/73  Pulse: (!) 108   (!) 104  Resp: 17   16  Temp: 97.9 F (36.6 C)  97.9 F (36.6 C) 97.9 F (36.6 C)  TempSrc: Oral   Oral  SpO2: 99%   97%  Weight:  (!) 144 kg    Height:  5\' 4"  (1.626 m)     Past Medical History:  Diagnosis Date  . Dysmenorrhea   . Migraine with aura   . Vitamin D deficiency    Past Surgical History:  Procedure Laterality Date  . abcessed tooth  03/14/2017   drained in ED of [facility]  . HERNIA REPAIR     AGE 11  . TONSILLECTOMY AND ADENOIDECTOMY    . TUBES IN EAR    . WISDOM TOOTH EXTRACTION  01/2017   Relationships  Social connections  . Talks on phone: More than three times a week  . Gets together: More than three times a week  . Attends religious service: More than 4 times per year  . Active member of club or organization: Not on file  . Attends meetings of clubs or organizations: Not on file  . Relationship status: Not on file   No current facility-administered medications on file prior to encounter.    Current Outpatient Medications  on File Prior to Encounter  Medication Sig Dispense Refill  . acetaminophen (TYLENOL) 500 MG tablet Take 1,000 mg by mouth every 6 (six) hours as needed.    Marland Kitchen albuterol (PROVENTIL HFA) 108 (90 Base) MCG/ACT inhaler Inhale 2 puffs into the lungs as needed.    Marland Kitchen aspirin EC 81 MG tablet Take 81 mg by mouth daily.    . Elastic Bandages & Supports (COMFORT FIT MATERNITY SUPP LG) MISC 1 Units by Does not apply route daily. 1 each 0  . Prenatal Vit-Fe Fumarate-FA (MULTIVITAMIN-PRENATAL) 27-0.8 MG TABS tablet Take 1 tablet by mouth daily at 12 noon.     Family History  Problem Relation Age of Onset  . Diabetes Mother   . Hypertension Mother   . Heart disease Father   . Diabetes Maternal Grandmother        TYPE 2  . Diabetes Maternal Grandfather        TYPE 2  . Breast cancer Other   . Cancer Other        PROSTATE   OB History  Gravida Para Term Preterm AB Living  2 0 0 0 1 0  SAB TAB Ectopic Multiple Live Births  1 0 0 0 0    # Outcome Date GA  Lbr Len/2nd Weight Sex Delivery Anes PTL Lv  2 Current           1 SAB              Fetal imaging: 08/15- BPP 8/8 Last growth- uncertain  Impression/Plan  Cyclic abnormal testing in the third trimester:  I reviewed Ms Paige Blanchard history and discussed her care with Paige Blanchard, her nurse and Dr. Dareen PianoAnderson.  She has had subsequent BPP of 4/8 and 6/8 with subsequent 8/8 scores.  Given that she is 35 weeks I recommend: 1) Initiate BMZ 2) Repeat testing in 48 hours 3) Daily NST 4) IF BPP 4/10-Deliver 5) If BPP 6/10 prior to BMZ completion repeat if persistent 6/10 or less Deliver 6) If BPP 6/10 with BMZ completed -Delivery 7) Remain in house until 36 will consider delivery planning at that time.  I spent 30 minute with >50% in non-face to face consulation and care coordination.  All questions answered  Paige Oliveorenthian J. Jerriah Ines, MD

## 2018-08-20 NOTE — H&P (Signed)
Paige Blanchard is an 23 y.o. G2P0010 6854w3d black female who is admitted for decreased FM and a BPP of 4/8 in the office. She has had problems with decreased fetal movement throughout the entire preg. She states that she can never feel the baby move. She has been admitted recently for the same problem. Yesterday on her NST she had only 2 10bpm accels in 40min. Bpp was 4/8. She was admitted and underwent a BPP at 1900 which was 8/8. She is requesting an induction.   Past Medical History:  Diagnosis Date  . Dysmenorrhea   . Migraine with aura   . Vitamin D deficiency     Past Surgical History:  Procedure Laterality Date  . abcessed tooth  03/14/2017   drained in ED of [facility]  . HERNIA REPAIR     AGE 43  . TONSILLECTOMY AND ADENOIDECTOMY    . TUBES IN EAR    . WISDOM TOOTH EXTRACTION  01/2017    Family History  Problem Relation Age of Onset  . Diabetes Mother   . Hypertension Mother   . Heart disease Father   . Diabetes Maternal Grandmother        TYPE 2  . Diabetes Maternal Grandfather        TYPE 2  . Breast cancer Other   . Cancer Other        PROSTATE   Social History:  reports that she has never smoked. She has never used smokeless tobacco. She reports that she does not drink alcohol or use drugs.  Allergies:  Allergies  Allergen Reactions  . Peanut Oil Hives  . Peanut-Containing Drug Products     Medications Prior to Admission  Medication Sig Dispense Refill  . acetaminophen (TYLENOL) 500 MG tablet Take 1,000 mg by mouth every 6 (six) hours as needed.    Marland Kitchen. albuterol (PROVENTIL HFA) 108 (90 Base) MCG/ACT inhaler Inhale 2 puffs into the lungs as needed.    Marland Kitchen. aspirin EC 81 MG tablet Take 81 mg by mouth daily.    . Elastic Bandages & Supports (COMFORT FIT MATERNITY SUPP LG) MISC 1 Units by Does not apply route daily. 1 each 0  . Prenatal Vit-Fe Fumarate-FA (MULTIVITAMIN-PRENATAL) 27-0.8 MG TABS tablet Take 1 tablet by mouth daily at 12 noon.          Blood pressure  124/73, pulse (!) 104, temperature 97.9 F (36.6 C), resp. rate 16, height 5\' 4"  (1.626 m), weight (!) 144 kg, last menstrual period 12/15/2017, SpO2 97 %. General appearance: alert, cooperative and mildly obese Abdomen: gravid, non tender   Lab Results  Component Value Date   WBC 8.1 06/12/2018   HGB 11.6 (L) 06/12/2018   HCT 33.2 (L) 06/12/2018   MCV 94.1 06/12/2018   PLT 202 06/12/2018   Lab Results  Component Value Date   PREGTESTUR Negative 07/20/2017   HCG <5.0 03/03/2017      Patient Active Problem List   Diagnosis Date Noted  . Decreased fetus movements affecting management of mother in third trimester 08/19/2018  . Decreased fetal movement 08/08/2018  . Vaginal bleeding in pregnancy, first trimester 03/26/2018  . Obesity in pregnancy, antepartum 03/15/2018  . Abdominal pain complicating pregnancy 02/21/2018  . Supervision of high risk pregnancy, antepartum 02/02/2018  . Mastodynia 05/26/2017  . LGSIL on Pap smear of cervix 05/21/2017  . Recurrent major depressive disorder (HCC) 10/26/2014  . Peanut allergy 11/24/2013  . Class 3 severe obesity due to excess calories without  serious comorbidity with body mass index (BMI) of 50.0 to 59.9 in adult [facility]) 11/14/2013  . Migraine 12/17/2011   IMP/ IUP at 35 1/2 weeks         Decreased FM Plan/ Continuous monitoring           MFM consult  ANDERSON,MARK E 08/20/2018, 9:45 AM

## 2018-08-20 NOTE — Progress Notes (Signed)
Spoke with Dr. Gertie Exon again regarding consult. Advised pt still off the monitor from before. Asked if he would let me know, after reviewing chart and discussing plan with patient and Dr. Ouida Sills, about any changes to orders for fetal monitoring.

## 2018-08-20 NOTE — Progress Notes (Signed)
Thank you for evaluating the pt and providing a clear plan. Last growth u/s was 33 weeks. EFW-62% Nl AFI.  Plan/ Will give Betamethasone now

## 2018-08-21 ENCOUNTER — Observation Stay (HOSPITAL_COMMUNITY): Payer: BC Managed Care – PPO

## 2018-08-21 DIAGNOSIS — F329 Major depressive disorder, single episode, unspecified: Secondary | ICD-10-CM | POA: Diagnosis present

## 2018-08-21 DIAGNOSIS — O368130 Decreased fetal movements, third trimester, not applicable or unspecified: Secondary | ICD-10-CM | POA: Diagnosis present

## 2018-08-21 DIAGNOSIS — O403XX0 Polyhydramnios, third trimester, not applicable or unspecified: Secondary | ICD-10-CM | POA: Diagnosis present

## 2018-08-21 DIAGNOSIS — O359XX0 Maternal care for (suspected) fetal abnormality and damage, unspecified, not applicable or unspecified: Secondary | ICD-10-CM | POA: Diagnosis not present

## 2018-08-21 DIAGNOSIS — Z20828 Contact with and (suspected) exposure to other viral communicable diseases: Secondary | ICD-10-CM | POA: Diagnosis present

## 2018-08-21 DIAGNOSIS — O99344 Other mental disorders complicating childbirth: Secondary | ICD-10-CM | POA: Diagnosis present

## 2018-08-21 DIAGNOSIS — Z3A35 35 weeks gestation of pregnancy: Secondary | ICD-10-CM | POA: Diagnosis not present

## 2018-08-21 DIAGNOSIS — O99214 Obesity complicating childbirth: Secondary | ICD-10-CM | POA: Diagnosis present

## 2018-08-21 DIAGNOSIS — O99213 Obesity complicating pregnancy, third trimester: Secondary | ICD-10-CM | POA: Diagnosis not present

## 2018-08-21 DIAGNOSIS — F419 Anxiety disorder, unspecified: Secondary | ICD-10-CM | POA: Diagnosis present

## 2018-08-21 NOTE — Progress Notes (Signed)
Pt still has felt no fetal movement. Maybe an occ ctx Reactive nst today. BPP tomorrow. Second steroid shot at 1600hrs.

## 2018-08-22 ENCOUNTER — Inpatient Hospital Stay (HOSPITAL_COMMUNITY): Payer: BC Managed Care – PPO

## 2018-08-22 DIAGNOSIS — O359XX0 Maternal care for (suspected) fetal abnormality and damage, unspecified, not applicable or unspecified: Secondary | ICD-10-CM

## 2018-08-22 DIAGNOSIS — Z3A35 35 weeks gestation of pregnancy: Secondary | ICD-10-CM

## 2018-08-22 DIAGNOSIS — O99213 Obesity complicating pregnancy, third trimester: Secondary | ICD-10-CM

## 2018-08-22 DIAGNOSIS — O368130 Decreased fetal movements, third trimester, not applicable or unspecified: Secondary | ICD-10-CM

## 2018-08-22 MED ORDER — DIPHENHYDRAMINE HCL 25 MG PO CAPS
50.0000 mg | ORAL_CAPSULE | Freq: Every evening | ORAL | Status: DC | PRN
  Administered 2018-08-22 – 2018-08-24 (×3): 50 mg via ORAL
  Filled 2018-08-22 (×3): qty 2

## 2018-08-22 NOTE — Progress Notes (Signed)
Patient ID: Paige Blanchard, female   DOB: [birth year], 23 y.o.   MRN: [id number]   HD#4 09+3 with decreased fetal movement  S: pt continues to not feel fetal movement. This has been a problem during the entire pregnancy for her. She has never really felt fetal movement well. Vitals:   08/21/18 1928 08/22/18 0647 08/22/18 0754 08/22/18 1200  BP: 125/77 115/68 (!) 103/50 111/71  Pulse: (!) 108 (!) 102 (!) 108 (!) 109  Resp: 20 20 20 20   Temp: 98.3 F (36.8 C) 97.7 F (36.5 C) 99 F (37.2 C) 98.9 F (37.2 C)  TempSrc: Oral  Oral Oral  SpO2: 97% 100% 99% 99%  Weight:      Height:       AOx3, NAD Normal work of breathing Abd soft  BPP official report not available, appears 8/8 NST reactive, cat 1 tracing  A/P 1) Chronic difficulty to perceive fetal movement. Current ANT reassuring.  2) S/P BMZ x 2 3) D/W pros/cons of preterm delivery. Continue in patient care until 42 as was suggested by MFM unless ANT necessitates delivery

## 2018-08-23 LAB — OB RESULTS CONSOLE GBS: GBS: POSITIVE

## 2018-08-23 LAB — GROUP B STREP BY PCR: Group B strep by PCR: POSITIVE — AB

## 2018-08-23 NOTE — Progress Notes (Signed)
MD notified of chaplain's concerns after speaking with the patient. Psych consult in, [facility] notified and will call RN back with a number to contact [facility] doctor.

## 2018-08-23 NOTE — Progress Notes (Signed)
23 y.o. G2P0010 [redacted]w[redacted]d HD#2 admitted for DFM.  Pt currently stable with no c/o.  Still not feeling movement.  Vitals:   08/22/18 1200 08/22/18 1521 08/22/18 2045 08/23/18 0533  BP: 111/71 (!) 113/53 (!) 85/46 (!) 101/45  Pulse: (!) 109 98 (!) 106 (!) 110  Resp: 20 20 19 18   Temp: 98.9 F (37.2 C) 98.1 F (36.7 C) 98.1 F (36.7 C) 98 F (36.7 C)  TempSrc: Oral Oral Oral Oral  SpO2: 99% 99% 95% 98%  Weight:      Height:        Lungs CTA Cor RRR Abd  Soft, gravid, nontender Ex SCDs FHTs  Yesterday at 10:20, 120s, good short term variability, NST R Toco  occ  No results found for this or any previous visit (from the past 24 hour(s)).  A:  HD#5  100w6d with  1) Chronic difficulty to perceive fetal movement. Current ANT reassuring.  2) S/P BMZ x 2 3) D/W pros/cons of preterm delivery. Continue in patient care until 38 as was suggested by MFM unless ANT necessitates delivery. 4) GBS today. 5) BPP 8/8 with AFI 19 yesterday.  Paige Blanchard

## 2018-08-23 NOTE — Progress Notes (Signed)
Initial visit with [facility].  Pt shared that she's been feeling significant depression.  She shared feelings of hopelessness and concern over whether she'll come out of it and reports she's afraid that will make her a bad mother. Pt is understandly worried about her daughter, [name], who she is not able to feel moving.  Pt's brother lost his son at 64 weeks and pt is concerned that something will happen to her daughter as well.  She has additional concerns that her stress will further hurt her daughter's health.  She shared that she has not shared the extent of her concerns with anyone except her mother who is supportive but told her she needs to get it together and be strong for her daughter.  Spencer hasn't been able to meet with her therapist due to interruptions from her medical care.  She said she almost wondered if she could have post partum depression before having a baby.  I explained perinatal mood and anxiety disorders and we discussed her current symptoms.  Pt acknowledged some passive SI, thinking her daughter might be better without her, but is fearful of telling anyone and losing her baby.  She said last night she was thinking about what she might do to end her life (possible intrusive thoughts), but she has no specific plan or intention to end her life.  She and her mom have discussed her mother keeping the baby for a while after she's born so she can get her mental health in order and I strongly encouraged her to discuss this with her physician to see if she can begin working on these symptoms before the baby is born.  We also discussed some coping techniques like guided meditation, the woebot app, and mindfulness techniques.  Pt is also aware of how to connect with spiritual support outside of the hospital.  I gave this information to [place], the patient's RN to convey to her provider.   Please page as further needs arise.  Donald Prose. Elyn Peers, M.Div. [facility] Chaplain Pager [phone] Office  [phone]

## 2018-08-23 NOTE — Progress Notes (Signed)
Nurse alerted me to Chaplain's concerns.  I spoke with pt face to face.  Pt is feeling stressed and depressed.  She is crying a lot and feels low.  She states she would never do anything to hurt the baby and therefore would not hurt herself at this point.    Pt was dealing with depression just before pregnancy but stopped her meds-she was on Toprimate and told by psych she could not be on that while pregnant.    Her brother's baby died in utero at about 65 weeks and she was told that "stress and the baby's kidney failure" caused the death.  She has been reluctant to tell us about her feelings since she is worried about medications and what we would think of herl   I d/w that there are safe meds to use in pregnancy for depression and although stress does not cause fetal demise, her health is very important to both her and her baby.  I understand her fears relating to her brother's baby and the fact that she does not feel fetal movement and has not since about 23 weeks.  I d/w her that I have asked for a psychiatry consult and she agrees.  Hopefully we can get some therapy set up while she is in the hospital as well as she has not been able to get into her therapist.    I would not d/c her tomorrow while she is being evaluated but pt does understand and is ok with d/c if that is the recommendation in the near future.

## 2018-08-23 NOTE — Progress Notes (Signed)
RN gave [facility] MD Dr Malachi Carl number. [facility] MD states he will call Dr Philis Pique tonight

## 2018-08-24 LAB — CBC
HCT: 34.6 % — ABNORMAL LOW (ref 36.0–46.0)
Hemoglobin: 11.9 g/dL — ABNORMAL LOW (ref 12.0–15.0)
MCH: 32.5 pg (ref 26.0–34.0)
MCHC: 34.4 g/dL (ref 30.0–36.0)
MCV: 94.5 fL (ref 80.0–100.0)
Platelets: 192 10*3/uL (ref 150–400)
RBC: 3.66 MIL/uL — ABNORMAL LOW (ref 3.87–5.11)
RDW: 14.4 % (ref 11.5–15.5)
WBC: 10 10*3/uL (ref 4.0–10.5)
nRBC: 0 % (ref 0.0–0.2)

## 2018-08-24 NOTE — Progress Notes (Signed)
I spent time with Paige Blanchard offering support and ministry of listening. She wants to do all that she can and all that she needs to do in order to help her be the best mom that she can be.  She wants to wait and talk further about possible medication to help with her depression, but she is also very eager to get home.  She stated that being here alone is making it harder to cope.  She does have an appointment with her therapist via telehealth at 2:00 and her therapist is aware of her symptoms at this time and thinks that medication would be helpful.  She was very transparent and used our time together wisely to process some of her emotions about her recent break up with FOB and how to coparent together when she is grieving the relationship.  She has good self awareness and is reaching out for support as she needs.  Chaplain Janne Napoleon, Bcc Pager, [phone] 10:52 AM    08/24/18 1000  Clinical Encounter Type  Visited With Patient  Visit Type Spiritual support  Referral From Chaplain;Nurse  Spiritual Encounters  Spiritual Needs Emotional  Stress Factors  Patient Stress Factors Loss of control;Major life changes

## 2018-08-24 NOTE — Consult Note (Signed)
Follow up consultation  Requesting Provider: Freda Munro, MD Date of Service: 08/24/18 Reason for Consult: Timing of Delivery   Paige Blanchard is a 23 yo G2P0 at 107 week gestation with known intermittent non-reassuring testing in combination with Maternal sensed decreased fetal movement.   Please see formal consultation on 08/15 for details.  Since that time she has done well. Completed her course of steroids, she still experiences a strong sense of decreased fetal movement with associated anxiety and depression.  Fetal surveillance has been good reactive daily NST's with 2x weekly BPP.  In discussion with Dr. Ouida Sills and Ms. Heidelberger- we have decided to keep Ms. Stroh inpatient until 37 weeks with delivery at that time. The risk were reviewed with Ms. Saephanh and she is in agreement with this plan.  NICU and Chaplain consultations were placed.  Recommendation: Continue fetal surveillance as state above Continue inpatient management Delivery at 44 weeks Chaplain, NICU and psychiatry consultation.  I spent 30 minutes with >50% in non-face to face communication and care coordination with Dr. Ouida Sills and Ms. Jacques.  All questions answered  Sander Nephew, MD

## 2018-08-24 NOTE — Progress Notes (Signed)
Pt is to have a tele health visit with her therapist this evening or in the morning. Pt states she will not harm herself or others, "I couldn't do that to Nevah". RN to discontinue psych order per MD.

## 2018-08-24 NOTE — Progress Notes (Signed)
Pt c/o mucus discharge. Rn instructed pt to save pad for RN to view.

## 2018-08-25 ENCOUNTER — Inpatient Hospital Stay (HOSPITAL_COMMUNITY): Payer: BC Managed Care – PPO

## 2018-08-25 DIAGNOSIS — O359XX0 Maternal care for (suspected) fetal abnormality and damage, unspecified, not applicable or unspecified: Secondary | ICD-10-CM

## 2018-08-25 DIAGNOSIS — O99213 Obesity complicating pregnancy, third trimester: Secondary | ICD-10-CM

## 2018-08-25 DIAGNOSIS — Z3A36 36 weeks gestation of pregnancy: Secondary | ICD-10-CM

## 2018-08-25 DIAGNOSIS — O368130 Decreased fetal movements, third trimester, not applicable or unspecified: Secondary | ICD-10-CM

## 2018-08-25 LAB — TYPE AND SCREEN
ABO/RH(D): A POS
Antibody Screen: NEGATIVE

## 2018-08-25 MED ORDER — DIPHENHYDRAMINE HCL 25 MG PO CAPS
25.0000 mg | ORAL_CAPSULE | Freq: Every evening | ORAL | Status: DC | PRN
  Administered 2018-08-25 – 2018-08-30 (×4): 25 mg via ORAL
  Filled 2018-08-25 (×4): qty 1

## 2018-08-25 MED ORDER — SERTRALINE HCL 50 MG PO TABS
50.0000 mg | ORAL_TABLET | Freq: Every day | ORAL | Status: DC
  Administered 2018-08-25 – 2018-08-31 (×7): 50 mg via ORAL
  Filled 2018-08-25 (×9): qty 1

## 2018-08-25 NOTE — Progress Notes (Signed)
Patient is doing well.  Has been speaking with a therapist regarding her feelings of depression.  They both feel that starting an anti-depressant at this time would be helpful.  Otherwise, no vb or contractions.  Still rarely feels movement.  Is excited and pleased that there is a delivery plan at this time  BP 104/63 (BP Location: Right Arm)   Pulse (!) 119   Temp 98.8 F (37.1 C) (Oral)   Resp 18   Ht 5\' 4"  (1.626 m)   Wt (!) 144 kg   LMP 12/15/2017 (Exact Date) Comment: 2 periods in past month/BTB  SpO2 98%   BMI 54.50 kg/m  NAD Abd; gravid, obese  A/p:  G2P0 at [redacted]w[redacted]d with continued decreased fetal movement S/p MFM consultation--plan daily NST and 2x/week BPP (in progress) until delivery at 37 weeks Continues to meet with her therapist daily via telemed S/p visit with chaplain today Depression--discussed r/b/a--will start zoloft today

## 2018-08-25 NOTE — Progress Notes (Signed)
Paige Blanchard was in good spirits today. She had a good session with her therapist and her therapist has made herself available to her throughout the hospitalization.  Now that she has a plan for when [name] will arrive, she is feeling more at peace.  I offered ministry of listening.  [place], [place] Pager, [phone] 3:42 PM    08/25/18 1500  Clinical Encounter Type  Visited With Patient  Visit Type Spiritual support  Spiritual Encounters  Spiritual Needs Emotional

## 2018-08-26 NOTE — Progress Notes (Signed)
G2P0 36.2 admitted for decreased FM Pt comfortable, stable NST Cat 1 TOCO: quiet A/P: per MFM plan for IOL 37.0 Cont. Current mgmt with daily NST and twice weekly BPP, ordered for M/Th Cont. counseling and zoloft, pt stable from psych standpoint.

## 2018-08-26 NOTE — Progress Notes (Signed)
Pt's nurse informed me that Paige Blanchard had had a loss in her family of her step-father's mother.  I offered grief support to her.  She is understandably sad that she cannot be with her family at this time and attend the funeral.  I offered ministry of listening as well as prayer, at [facility] request.  [name], bcc Pager, [phone] 1:53 PM

## 2018-08-27 NOTE — Progress Notes (Addendum)
G2P0 @ 36.3 for DFM Pt comfortable, no complaints. FHT: NST yesterday Cat 1, today's NST not yet performed A/P:  Cont. Current mgmt Plan for IOL per MFM 37.0 Reminded pt to where SCD's while in bed to reduce DVT/PE risk.

## 2018-08-28 ENCOUNTER — Inpatient Hospital Stay (HOSPITAL_COMMUNITY): Payer: BC Managed Care – PPO

## 2018-08-28 DIAGNOSIS — O359XX0 Maternal care for (suspected) fetal abnormality and damage, unspecified, not applicable or unspecified: Secondary | ICD-10-CM

## 2018-08-28 DIAGNOSIS — Z3A36 36 weeks gestation of pregnancy: Secondary | ICD-10-CM

## 2018-08-28 DIAGNOSIS — O368130 Decreased fetal movements, third trimester, not applicable or unspecified: Secondary | ICD-10-CM

## 2018-08-28 DIAGNOSIS — O99213 Obesity complicating pregnancy, third trimester: Secondary | ICD-10-CM

## 2018-08-28 NOTE — Plan of Care (Signed)
Denies pain only pelvic pressure at times.Patient placed on EFM when she has this complaint.

## 2018-08-28 NOTE — Progress Notes (Signed)
BPP report reviewed scorring 6/8 with Cat 1 NST making complete BPP 8/10.  Polyhydramnios noted on exam =26. Maintain current mgmt.

## 2018-08-28 NOTE — Progress Notes (Signed)
G2P0 @36 .4 for DFM Pt comfortable, no complaints. FHT: yesterday morning NST Cat 1, last night NST Cat 1 A/P:  Pt just returned from BPP ordered as q3d, report not yet available Plan for IOL per MFM 37.0 Cont. Other current mgmt.

## 2018-08-29 NOTE — Progress Notes (Signed)
Follow up visit with Paige Blanchard, who was on the phone when I stopped in.  Her affect appeared bright and she said she was feeling a bit better.  She said she would like me to come by again in the morning, which I will do.  Please page as further needs arise.  Donald Prose. Elyn Peers, M.Div. [facility] Chaplain Pager [phone] Office [phone]

## 2018-08-29 NOTE — Progress Notes (Signed)
NST R this am.

## 2018-08-29 NOTE — Progress Notes (Signed)
23 y.o. G2P0010 [redacted]w[redacted]d HD#8 admitted for DFM.  Pt currently stable with no new c/o this am.   Pt had contractions last night.  Vitals:   08/28/18 1157 08/28/18 1635 08/28/18 1950 08/28/18 2205  BP: 104/68 115/76 117/61 (!) 104/56  Pulse: (!) 105 (!) 115 (!) 114 97  Resp: 16 16 18 16   Temp: 98.2 F (36.8 C) 98.3 F (36.8 C) 97.9 F (36.6 C) 97.7 F (36.5 C)  TempSrc: Oral Oral Oral Oral  SpO2: 98% 98% 95% 98%  Weight:      Height:        Lungs CTA Cor RRR Abd  Soft, gravid, nontender Ex SCDs FHTs  Last night 140ss, good short term variability, NST R Toco  q5 min  GBS POS  A:  HD#8  [redacted]w[redacted]d with inability to feel baby move and depression. P: per MFM plan for IOL 37.0 Cont. Current mgmt with daily NST and twice weekly BPP, ordered for M/Th Cont. counseling and zoloft, pt stable from psych standpoint.         PCN in labor.  Daria Pastures

## 2018-08-29 NOTE — Progress Notes (Signed)
Pt called out saying she was contracting. RN put pt on monitor. Pt then calls out and says she want monitors to be removed and she is not hurting as bad now.

## 2018-08-30 LAB — TYPE AND SCREEN
ABO/RH(D): A POS
Antibody Screen: NEGATIVE

## 2018-08-30 LAB — RPR: RPR Ser Ql: NONREACTIVE

## 2018-08-30 MED ORDER — LIDOCAINE HCL (PF) 1 % IJ SOLN: 30.0000 mL | INTRAMUSCULAR | Status: DC | PRN

## 2018-08-30 MED ORDER — FENTANYL CITRATE (PF) 100 MCG/2ML IJ SOLN
50.0000 ug | INTRAMUSCULAR | Status: DC | PRN
  Administered 2018-08-31 (×7): 100 ug via INTRAVENOUS
  Filled 2018-08-30 (×7): qty 2

## 2018-08-30 MED ORDER — LACTATED RINGERS IV SOLN
INTRAVENOUS | Status: DC
  Administered 2018-08-31 (×2): via INTRAVENOUS

## 2018-08-30 MED ORDER — SODIUM CHLORIDE 0.9 % IV SOLN
5.0000 10*6.[IU] | Freq: Once | INTRAVENOUS | Status: AC
  Administered 2018-08-31: 5 10*6.[IU] via INTRAVENOUS
  Filled 2018-08-30 (×2): qty 5

## 2018-08-30 MED ORDER — ONDANSETRON HCL 4 MG/2ML IJ SOLN
4.0000 mg | Freq: Four times a day (QID) | INTRAMUSCULAR | Status: DC | PRN
  Administered 2018-09-01: 04:00:00 4 mg via INTRAVENOUS
  Filled 2018-08-30: qty 2

## 2018-08-30 MED ORDER — PENICILLIN G 3 MILLION UNITS IVPB - SIMPLE MED
3.0000 10*6.[IU] | INTRAVENOUS | Status: DC
  Administered 2018-08-31 – 2018-09-01 (×7): 3 10*6.[IU] via INTRAVENOUS
  Filled 2018-08-30 (×7): qty 100

## 2018-08-30 MED ORDER — OXYTOCIN BOLUS FROM INFUSION: 500.0000 mL | Freq: Once | INTRAVENOUS | Status: DC

## 2018-08-30 MED ORDER — MISOPROSTOL 25 MCG QUARTER TABLET
25.0000 ug | ORAL_TABLET | ORAL | Status: DC | PRN
  Administered 2018-08-31 (×5): 25 ug via VAGINAL
  Filled 2018-08-30 (×6): qty 1

## 2018-08-30 MED ORDER — OXYCODONE-ACETAMINOPHEN 5-325 MG PO TABS: 1.0000 | ORAL_TABLET | ORAL | Status: DC | PRN

## 2018-08-30 MED ORDER — TERBUTALINE SULFATE 1 MG/ML IJ SOLN: 0.2500 mg | Freq: Once | INTRAMUSCULAR | Status: DC | PRN

## 2018-08-30 MED ORDER — SOD CITRATE-CITRIC ACID 500-334 MG/5ML PO SOLN: 30.0000 mL | ORAL | Status: DC | PRN

## 2018-08-30 MED ORDER — ACETAMINOPHEN 325 MG PO TABS: 650.0000 mg | ORAL_TABLET | ORAL | Status: DC | PRN

## 2018-08-30 MED ORDER — LACTATED RINGERS IV SOLN: 500.0000 mL | INTRAVENOUS | Status: DC | PRN

## 2018-08-30 MED ORDER — OXYCODONE-ACETAMINOPHEN 5-325 MG PO TABS: 2.0000 | ORAL_TABLET | ORAL | Status: DC | PRN

## 2018-08-30 MED ORDER — OXYTOCIN 40 UNITS IN NORMAL SALINE INFUSION - SIMPLE MED: 2.5000 [IU]/h | INTRAVENOUS | Status: DC

## 2018-08-30 NOTE — Progress Notes (Signed)
Initial Nutrition Assessment  DOCUMENTATION CODES:   Morbid obesity  INTERVENTION:  Regular diet Pt may order snacks TID and double protein portions upon request  NUTRITION DIAGNOSIS:   Increased nutrient needs related to (pregnancy and fetal growth requirements) as evidenced by (36 weeks IUP).   GOAL:  Patient will meet greater than or equal to 90% of their needs   MONITOR:  Weight trends  REASON FOR ASSESSMENT:  Antenatal   ASSESSMENT:   36 6/7 weeks, adm due to decreased fetal movement. pre-gravid wt 137.9 kg, BMI 52.1. 13 lb wt gain to date. delivery planned for 37 weeks. labs reviewed   Diet Order:   Diet Order            Diet regular Room service appropriate? Yes; Fluid consistency: Thin  Diet effective now             EDUCATION NEEDS:   No education needs have been identified at this time  Skin:  Skin Assessment: Reviewed RN Assessment  Height:   Ht Readings from Last 1 Encounters:  08/20/18 5\' 4"  (1.626 m)   Weight:   Wt Readings from Last 1 Encounters:  08/20/18 (!) 144 kg    Ideal Body Weight:   120 lbs  BMI:  Body mass index is 54.5 kg/m.  Estimated Nutritional Needs:   Kcal:  2400-2600  Protein:  105-115 g  Fluid:  2.6 L    Weyman Rodney M.Fredderick Severance LDN Neonatal Nutrition Support Specialist/RD III Pager [phone]      Phone [phone]

## 2018-08-30 NOTE — Plan of Care (Signed)
  Problem: Coping: Goal: Level of anxiety will decrease Outcome: Completed/Met   Pt is continuing therapy sessions with her therapist via phone while she is here in the hospital. Pt also reminded chaplain is available when needed and will be given daily Zoloft.

## 2018-08-30 NOTE — Progress Notes (Signed)
23 y.o. G2P0010 [redacted]w[redacted]d HD#9 admitted for DFM.  Patient doing well, no complaints this AM  Vitals:   08/29/18 1554 08/29/18 1559 08/29/18 1922 08/30/18 0903  BP: (!) 89/49 105/86 (!) 122/55 138/73  Pulse: (!) 116 (!) 119 98 (!) 110  Resp:   20 18  Temp:   98.2 F (36.8 C) 98 F (36.7 C)  TempSrc:    Oral  SpO2:    100%  Weight:      Height:        NAD Abd  Obese, soft, gravid, nontender Ex SCDs FHTs  Last night 150s, moderate variability, + accelerations--reactive NST   GBS POS  A:  HD#9  [redacted]w[redacted]d with inability to feel baby move and depression. P: per MFM plan for IOL 37.0 Cont. Current mgmt with daily NST and twice weekly BPP, ordered for M/Th Cont. counseling and zoloft, pt stable from psych standpoint.        Will plan to transfer to L&D tonight for cervical ripening at 37 weeks per MFM NST pending for today PCN in labor.  [place]

## 2018-08-30 NOTE — Progress Notes (Signed)
Follow up visit with Paige Blanchard who is anticipating delivering her daugther [name] tomorrow.  She will move to labor and delivery tonight to begin the process.  [name] is sad that her mother won't be able to be with her because of a relative's funeral.  She plans to have FOB present despite their recent break up.  She is working on finding the balance of maintaining healthy boundaries for her own self care and supporting his desire (and her desire for him) to be a good father. We role played some approaches she might take to maintaining this balance once the baby is here and they're in separate homes.  [name] continues to practice some of the coping techniques we reviewed last week and is able to be in contact with her therapist.  [name] also grateful that her medication has started and already feels better knowing she is moving in the right direction to better mental health.  She has some anxiety about childbirth and being a new mother and I offered her space to process her emotions around her changing identity as a mother.    Please page as further needs arise.  Donald Prose. Elyn Peers, M.Div. [facility] Chaplain Pager [phone] Office [phone]

## 2018-08-31 ENCOUNTER — Inpatient Hospital Stay (HOSPITAL_COMMUNITY): Payer: BC Managed Care – PPO | Admitting: Anesthesiology

## 2018-08-31 ENCOUNTER — Other Ambulatory Visit: Payer: Self-pay

## 2018-08-31 ENCOUNTER — Encounter (HOSPITAL_COMMUNITY): Payer: Self-pay

## 2018-08-31 MED ORDER — SODIUM CHLORIDE (PF) 0.9 % IJ SOLN
INTRAMUSCULAR | Status: DC | PRN
  Administered 2018-08-31: 12 mL/h via EPIDURAL

## 2018-08-31 MED ORDER — PHENYLEPHRINE 40 MCG/ML (10ML) SYRINGE FOR IV PUSH (FOR BLOOD PRESSURE SUPPORT): 80.0000 ug | PREFILLED_SYRINGE | INTRAVENOUS | Status: DC | PRN

## 2018-08-31 MED ORDER — DIPHENHYDRAMINE HCL 50 MG/ML IJ SOLN: 12.5000 mg | INTRAMUSCULAR | Status: DC | PRN

## 2018-08-31 MED ORDER — EPHEDRINE 5 MG/ML INJ: 10.0000 mg | INTRAVENOUS | Status: DC | PRN

## 2018-08-31 MED ORDER — PHENYLEPHRINE 40 MCG/ML (10ML) SYRINGE FOR IV PUSH (FOR BLOOD PRESSURE SUPPORT)
80.0000 ug | PREFILLED_SYRINGE | INTRAVENOUS | Status: DC | PRN
  Filled 2018-08-31: qty 10

## 2018-08-31 MED ORDER — TERBUTALINE SULFATE 1 MG/ML IJ SOLN: 0.2500 mg | Freq: Once | INTRAMUSCULAR | Status: DC | PRN

## 2018-08-31 MED ORDER — OXYTOCIN 40 UNITS IN NORMAL SALINE INFUSION - SIMPLE MED
1.0000 m[IU]/min | INTRAVENOUS | Status: DC
  Administered 2018-08-31: 2 m[IU]/min via INTRAVENOUS
  Filled 2018-08-31: qty 1000

## 2018-08-31 MED ORDER — LACTATED RINGERS IV SOLN: 500.0000 mL | Freq: Once | INTRAVENOUS | Status: DC

## 2018-08-31 MED ORDER — LIDOCAINE HCL (PF) 1 % IJ SOLN
INTRAMUSCULAR | Status: DC | PRN
  Administered 2018-08-31: 10 mL via EPIDURAL

## 2018-08-31 MED ORDER — FENTANYL-BUPIVACAINE-NACL 0.5-0.125-0.9 MG/250ML-% EP SOLN
12.0000 mL/h | EPIDURAL | Status: DC | PRN
  Filled 2018-08-31: qty 250

## 2018-08-31 NOTE — Progress Notes (Signed)
Patient ID: Paige Blanchard, female   DOB: [birth year], 23 y.o.   MRN: [id number]   Met patient to assure she is aware I am covering for her practice while they are in surgery. Discussed my availability to address any emergencies that might arise and otherwise will wait for management by primary team. Patient voiced understanding.   Patient is resting comfortably on her phone. She had questions about pain medications. Reviewed her primary teams orders for fentanyl and discussed with patient to ask RN if she need additional doses-- she does not feel she needs this at this time.   Caren Macadam, MD, MPH, ABFM Attending [place] for [facility]

## 2018-08-31 NOTE — Progress Notes (Signed)
I checked in on Paige Blanchard today.  She was hurting, but trying to stay strong and is so grateful that her baby is on the way.  She has good support from her mother by phone.    [place], [place] Pager, [phone] 4:29 PM

## 2018-08-31 NOTE — Anesthesia Procedure Notes (Signed)
Epidural Patient location during procedure: OB Start time: 08/31/2018 9:57 PM End time: 08/31/2018 10:08 PM  Staffing Anesthesiologist: Lidia Collum, MD Performed: anesthesiologist   Preanesthetic Checklist Completed: patient identified, pre-op evaluation, timeout performed, IV checked, risks and benefits discussed and monitors and equipment checked  Epidural Patient position: sitting Prep: DuraPrep Patient monitoring: heart rate, continuous pulse ox and blood pressure Approach: midline Location: L3-L4 Injection technique: LOR air  Needle:  Needle type: Tuohy  Needle gauge: 17 G Needle length: 9 cm Needle insertion depth: 9 cm Catheter type: closed end flexible Catheter size: 19 Gauge Catheter at skin depth: 14 cm Test dose: negative  Assessment Events: blood not aspirated, injection not painful, no injection resistance, negative IV test and no paresthesia  Additional Notes Reason for block:procedure for pain

## 2018-08-31 NOTE — Anesthesia Preprocedure Evaluation (Signed)
Anesthesia Evaluation  Patient identified by MRN, date of birth, ID band Patient awake    Reviewed: Allergy & Precautions, H&P , NPO status , Patient's Chart, lab work & pertinent test results  History of Anesthesia Complications Negative for: history of anesthetic complications  Airway Mallampati: II  TM Distance: >3 FB Neck ROM: full    Dental no notable dental hx.    Pulmonary neg pulmonary ROS,    Pulmonary exam normal        Cardiovascular negative cardio ROS Normal cardiovascular exam Rhythm:regular Rate:Normal     Neuro/Psych negative neurological ROS  negative psych ROS   GI/Hepatic negative GI ROS, Neg liver ROS,   Endo/Other  Morbid obesity  Renal/GU negative Renal ROS  negative genitourinary   Musculoskeletal negative musculoskeletal ROS (+)   Abdominal   Peds  Hematology negative hematology ROS (+)   Anesthesia Other Findings   Reproductive/Obstetrics (+) Pregnancy                             Anesthesia Physical Anesthesia Plan  ASA: III  Anesthesia Plan: Epidural   Post-op Pain Management:    Induction:   PONV Risk Score and Plan:   Airway Management Planned:   Additional Equipment:   Intra-op Plan:   Post-operative Plan:   Informed Consent: I have reviewed the patients History and Physical, chart, labs and discussed the procedure including the risks, benefits and alternatives for the proposed anesthesia with the patient or authorized representative who has indicated his/her understanding and acceptance.       Plan Discussed with:   Anesthesia Plan Comments:         Anesthesia Quick Evaluation

## 2018-08-31 NOTE — Progress Notes (Signed)
Follow up with Paige Blanchard who has been eagerly awaiting her delivery day due to persistent anxiety and decreased fetal movement.  She is appropriately in physical pain, but coping well.  She's focused on bringing her baby into the world and shared how emotional she become upon seeing the infant warmer.  We explored her changing role and coping techniques.  She is aware that spiritual support continues to be available to her.  Please page as further needs arise.  Donald Prose. Elyn Peers, M.Div. [facility] Chaplain Pager [phone] Office [phone]

## 2018-08-31 NOTE — Progress Notes (Signed)
Pt feeling more uncomfortable and crampy.  Denies any other complaints. FHT: 140 mod var +accels no decels, currently Cat 1 TOCO: irreg SVE: 1/50/-3 A/P: 37.0 IOL for persistent DFM S/p cytotec x 4 doses, last dose approx 7pm Foley bulb placed, will start low dose pit at 11pm Pt desires epidural, ok to place Advised SCD use Other routine care

## 2018-09-01 MED ORDER — ACETAMINOPHEN 325 MG PO TABS: 650.0000 mg | ORAL_TABLET | ORAL | Status: DC | PRN

## 2018-09-01 MED ORDER — SERTRALINE HCL 50 MG PO TABS
50.0000 mg | ORAL_TABLET | Freq: Every day | ORAL | Status: DC
  Administered 2018-09-01 – 2018-09-02 (×2): 50 mg via ORAL
  Filled 2018-09-01 (×2): qty 1

## 2018-09-01 MED ORDER — BENZOCAINE-MENTHOL 20-0.5 % EX AERO
1.0000 "application " | INHALATION_SPRAY | CUTANEOUS | Status: DC | PRN
  Administered 2018-09-01: 1 via TOPICAL
  Filled 2018-09-01 (×2): qty 56

## 2018-09-01 MED ORDER — METHYLERGONOVINE MALEATE 0.2 MG PO TABS: 0.2000 mg | ORAL_TABLET | ORAL | Status: DC | PRN

## 2018-09-01 MED ORDER — OXYCODONE-ACETAMINOPHEN 5-325 MG PO TABS
1.0000 | ORAL_TABLET | ORAL | Status: DC | PRN
  Administered 2018-09-01: 1 via ORAL
  Filled 2018-09-01: qty 1

## 2018-09-01 MED ORDER — SENNOSIDES-DOCUSATE SODIUM 8.6-50 MG PO TABS
2.0000 | ORAL_TABLET | ORAL | Status: DC
  Administered 2018-09-01: 2 via ORAL
  Filled 2018-09-01: qty 2

## 2018-09-01 MED ORDER — SIMETHICONE 80 MG PO CHEW: 80.0000 mg | CHEWABLE_TABLET | ORAL | Status: DC | PRN

## 2018-09-01 MED ORDER — IBUPROFEN 600 MG PO TABS
600.0000 mg | ORAL_TABLET | Freq: Four times a day (QID) | ORAL | Status: DC
  Administered 2018-09-01 – 2018-09-02 (×5): 600 mg via ORAL
  Filled 2018-09-01 (×5): qty 1

## 2018-09-01 MED ORDER — ZOLPIDEM TARTRATE 5 MG PO TABS: 5.0000 mg | ORAL_TABLET | Freq: Every evening | ORAL | Status: DC | PRN

## 2018-09-01 MED ORDER — OXYCODONE-ACETAMINOPHEN 5-325 MG PO TABS: 2.0000 | ORAL_TABLET | ORAL | Status: DC | PRN

## 2018-09-01 MED ORDER — TETANUS-DIPHTH-ACELL PERTUSSIS 5-2.5-18.5 LF-MCG/0.5 IM SUSP: 0.5000 mL | Freq: Once | INTRAMUSCULAR | Status: DC

## 2018-09-01 MED ORDER — METHYLERGONOVINE MALEATE 0.2 MG/ML IJ SOLN: 0.2000 mg | INTRAMUSCULAR | Status: DC | PRN

## 2018-09-01 MED ORDER — DIPHENHYDRAMINE HCL 25 MG PO CAPS: 25.0000 mg | ORAL_CAPSULE | Freq: Four times a day (QID) | ORAL | Status: DC | PRN

## 2018-09-01 MED ORDER — DIBUCAINE (PERIANAL) 1 % EX OINT: 1.0000 "application " | TOPICAL_OINTMENT | CUTANEOUS | Status: DC | PRN

## 2018-09-01 MED ORDER — COCONUT OIL OIL: 1.0000 "application " | TOPICAL_OIL | Status: DC | PRN

## 2018-09-01 MED ORDER — WITCH HAZEL-GLYCERIN EX PADS: 1.0000 "application " | MEDICATED_PAD | CUTANEOUS | Status: DC | PRN

## 2018-09-01 MED ORDER — PRENATAL MULTIVITAMIN CH
1.0000 | ORAL_TABLET | Freq: Every day | ORAL | Status: DC
  Administered 2018-09-01 – 2018-09-02 (×2): 1 via ORAL
  Filled 2018-09-01 (×2): qty 1

## 2018-09-01 MED ORDER — ONDANSETRON HCL 4 MG PO TABS: 4.0000 mg | ORAL_TABLET | ORAL | Status: DC | PRN

## 2018-09-01 MED ORDER — ONDANSETRON HCL 4 MG/2ML IJ SOLN: 4.0000 mg | INTRAMUSCULAR | Status: DC | PRN

## 2018-09-01 NOTE — Progress Notes (Signed)
Called RN to give SBAR. Unavailable to receive report at this time, will return my call when available.

## 2018-09-01 NOTE — Lactation Note (Signed)
This note was copied from a baby's chart. Lactation Consultation Note  Patient Name: Paige Blanchard Date: 09/01/2018 Reason for consult: Initial assessment  2018 - 2023 - I visited Paige Blanchard to conduct initial breast feeding education. She states that she attempted to latch her daughter at delivery. When baby "Chrystie Nose" did not latch, she decided to bottle feed formula. Paige Blanchard states that she may want to initiate pumping after discharge, at home.  I reviewed day 1 infant feeding patterns and nature of supply and demand with milk production. I also discussed feeding frequency for an infant and output expectations. I encouraged early, frequent stimulation for the best milk production. I noted mom had a breast feeding pillow in her room. I offered to help her with latching baby or bring in a breast pump.  She states that she wants to initiate pumping tomorrow.  I shared mom's wishes with the night shift RN. I recommend that lactation follow up with RN in am to see if Paige Blanchard would like to be seen by lactation.   Maternal Data Formula Feeding for Exclusion: (unclear) Does the patient have breastfeeding experience prior to this delivery?: No  Feeding Feeding Type: Bottle Fed - Formula Nipple Type: Slow - flow   Interventions Interventions: Breast feeding basics reviewed  Consult Status Consult Status: Follow-up Date: 09/02/18(follow up with RN first) Follow-up type: In-patient    Lenore Manner 09/01/2018, 8:29 PM

## 2018-09-01 NOTE — Progress Notes (Signed)
Baseline FHR indeterminate due to patients position and pushing with contractions.  FHR audible and heart rate heard at 158. MD and Nurse at bedside.

## 2018-09-01 NOTE — Progress Notes (Signed)
Paige Blanchard was resting when I came to follow up with her.  I will check in with her tomorrow.  [place], Bcc Pager, [phone] 4:18 PM

## 2018-09-01 NOTE — Anesthesia Postprocedure Evaluation (Signed)
Anesthesia Post Note  Patient: Paige Blanchard  Procedure(s) Performed: AN AD [place]     Patient location during evaluation: Mother Baby Anesthesia Type: Epidural Level of consciousness: awake and alert Pain management: pain level controlled Vital Signs Assessment: post-procedure vital signs reviewed and stable Respiratory status: spontaneous breathing, nonlabored ventilation and respiratory function stable Cardiovascular status: stable Postop Assessment: no headache, no backache, epidural receding, no apparent nausea or vomiting, patient able to bend at knees, adequate PO intake and able to ambulate Anesthetic complications: no    Last Vitals:  Vitals:   09/01/18 1139 09/01/18 1531  BP: (!) 115/54 115/67  Pulse: 99 90  Resp: 18 16  Temp: 37 C 36.7 C  SpO2: 99% 100%    Last Pain:  Vitals:   09/01/18 1830  TempSrc:   PainSc: 7    Pain Goal: Patients Stated Pain Goal: 2 (08/30/18 0745)                 Jabier Mutton

## 2018-09-02 LAB — CBC
HCT: 33.6 % — ABNORMAL LOW (ref 36.0–46.0)
Hemoglobin: 11.5 g/dL — ABNORMAL LOW (ref 12.0–15.0)
MCH: 32.5 pg (ref 26.0–34.0)
MCHC: 34.2 g/dL (ref 30.0–36.0)
MCV: 94.9 fL (ref 80.0–100.0)
Platelets: 165 10*3/uL (ref 150–400)
RBC: 3.54 MIL/uL — ABNORMAL LOW (ref 3.87–5.11)
RDW: 13.8 % (ref 11.5–15.5)
WBC: 10.3 10*3/uL (ref 4.0–10.5)
nRBC: 0 % (ref 0.0–0.2)

## 2018-09-02 MED ORDER — SERTRALINE HCL 50 MG PO TABS: 50.0000 mg | ORAL_TABLET | Freq: Every day | ORAL | 4 refills | Status: DC

## 2018-09-02 NOTE — Progress Notes (Signed)
CSW received consult for history of MDD, SI and Edinburgh Score of 14 with answer of 1 to question 10.  CSW met with MOB to offer support and complete assessment.    MOB sitting up in bed talking on the phone and holding infant, when CSW entered the room. FOB also present and asleep on the couch. CSW informed that MOB was speaking with her father and had attempted to hang up but father remained on the phone and gave CSW permission to continue assessment with him listening. CSW also received verbal permission to complete assessment with FOB present. CSW introduced self and explained reason for consult to which MOB was understanding. CSW inquired about MOB's mental health history and MOB acknowledged being diagnosed with MDD about 3 years ago. MOB shared she has experienced recently feelings of depression but informed CSW that she was started on Zoloft and is seeing a therapist [place]) twice a week. MOB shared she really enjoys her therapist and has already met with her about 5 times. MOB also shared she has already felt positive changes since beginning Zoloft. MOB stated she started school last week and is in her last year of program. MOB reported that has contributed to her feelings. MOB appeared to be in good-spirits during [place] visit and did not appear to be displaying any acute mental health symptoms. CSW inquired about MOB's answer of "hardly ever" to question 10 (thoughts of harming self). Per MOB, she thought that meant she never felt that way. CSW aware MOB has had thoughts of SI in the past and during hospital stay but MOB did not appear to want to discuss this with CSW. MOB reported she "could not do that to her baby". MOB denied any current SI or HI and reported having a good support system consisting of FOB, her father and her mother. MOB well aware of baby blues period vs. perinatal mood disorders and visits with therapist regularly. CSW recommended self-evaluation during the postpartum  time period using the New Mom Checklist from Postpartum Progress and encouraged MOB to contact a medical professional if symptoms are noted at any time.  MOB confirmed having all essential items for infant once discharged and reported infant would be sleeping in either a crib or bassinet once home. CSW provided review of Sudden Infant Death Syndrome (SIDS) precautions and safe sleeping habits.    CSW identifies no further need for intervention and no barriers to discharge at this time.  Elijio Miles, [place]  Women's and [employer] [phone]

## 2018-09-02 NOTE — Discharge Summary (Signed)
Obstetric Discharge Summary Reason for Admission: Pt admitted for chronic inability to feel baby move.  ANT was reassuring but pt also dx with depression at this time.  Pt then induced at 37 weeks per MFM.   Prenatal Procedures: NST and ultrasound Intrapartum Procedures: spontaneous vaginal delivery Postpartum Procedures: none Complications-Operative and Postpartum: hymenal laceration Hemoglobin  Date Value Ref Range Status  08/24/2018 11.9 (L) 12.0 - 15.0 g/dL Final  02/22/2018 12.7 11.1 - 15.9 g/dL Final   HCT  Date Value Ref Range Status  08/24/2018 34.6 (L) 36.0 - 46.0 % Final   Hematocrit  Date Value Ref Range Status  02/22/2018 36.8 34.0 - 46.6 % Final    Discharge Diagnoses: Term Pregnancy-delivered and depression  Discharge Information: Date: 09/02/2018 Activity: pelvic rest Diet: routine Medications: PNV, Ibuprofen and sertraline Condition: stable Instructions: refer to practice specific booklet Discharge to: home Follow-up Information    Vanessa Kick, MD Follow up in 4 week(s).   Specialty: Obstetrics and Gynecology Contact information: [address] [place] [address] [phone]           Newborn Data: Live born female  Birth Weight: 6 lb 3.3 oz (2815 g) APGAR: 73, 9  Newborn Delivery   Birth date/time: 09/01/2018 08:12:00 Delivery type: Vaginal, Spontaneous      Home with mother.  Paige Blanchard 09/02/2018, 6:59 AM

## 2018-09-02 NOTE — Progress Notes (Signed)
Patient is eating, ambulating, voiding.  Pain control is good.  Vitals:   09/01/18 1531 09/01/18 1930 09/01/18 2322 09/02/18 0634  BP: 115/67 114/81 93/60 115/81  Pulse: 90 84 79 88  Resp: 16 17 18 18   Temp: 98.1 F (36.7 C) 98.6 F (37 C) (!) 97.5 F (36.4 C)   TempSrc:  Oral Oral   SpO2: 100%     Weight:      Height:        Fundus firm Perineum without swelling.  Lab Results  Component Value Date   WBC 10.0 08/24/2018   HGB 11.9 (L) 08/24/2018   HCT 34.6 (L) 08/24/2018   MCV 94.5 08/24/2018   PLT 192 08/24/2018    --/--/A POS (08/25 1005)/RI  A/P Post partum day 1.  Routine care.  Expect d/c routine.    Paige Blanchard

## 2018-09-02 NOTE — Progress Notes (Signed)
I stopped in to check on [name] before d/c.  They were both doing very well and [name] is coping really well with the stresses of a new baby.  I offered them blessings and ministry of presence.  [place], Bcc Pager, [phone] 1:35 PM

## 2018-12-14 ENCOUNTER — Encounter (HOSPITAL_COMMUNITY): Payer: Self-pay

## 2019-09-03 IMAGING — US US MFM FETAL BPP WO NON STRESS
1 series · 13 of 28 positions shown · non-contrast
Comparison: none

[Series 1: us mfm fetal bpp wo non stress · 31 acquisitions, 13 frames shown]
[im 2/31]
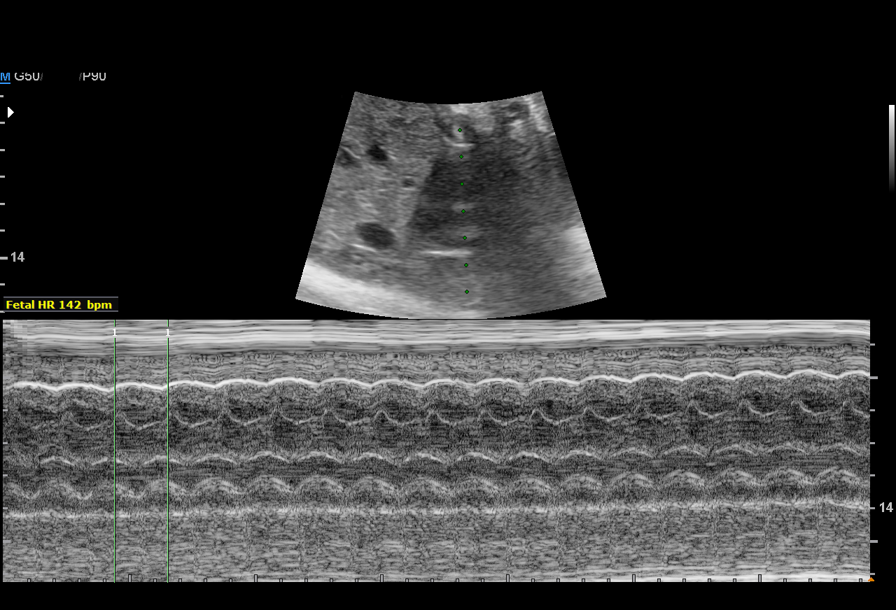
[im 4/31]
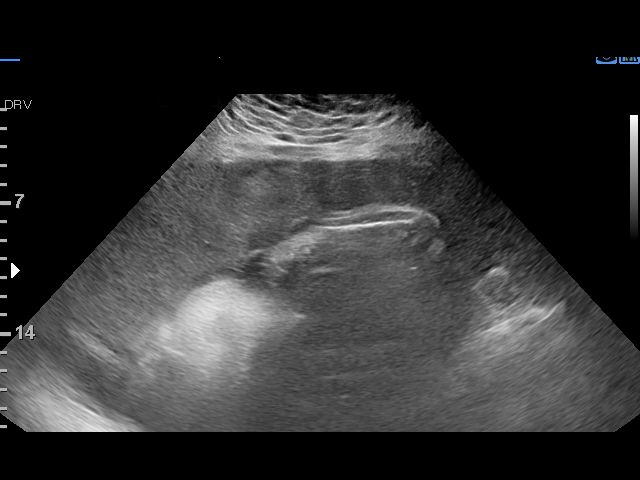
[im 6/31]
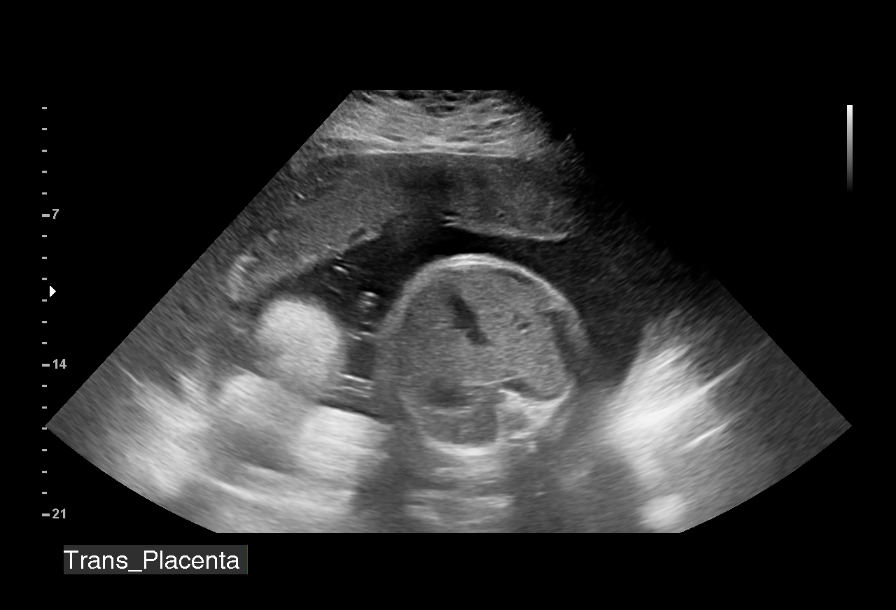
[im 8/31]
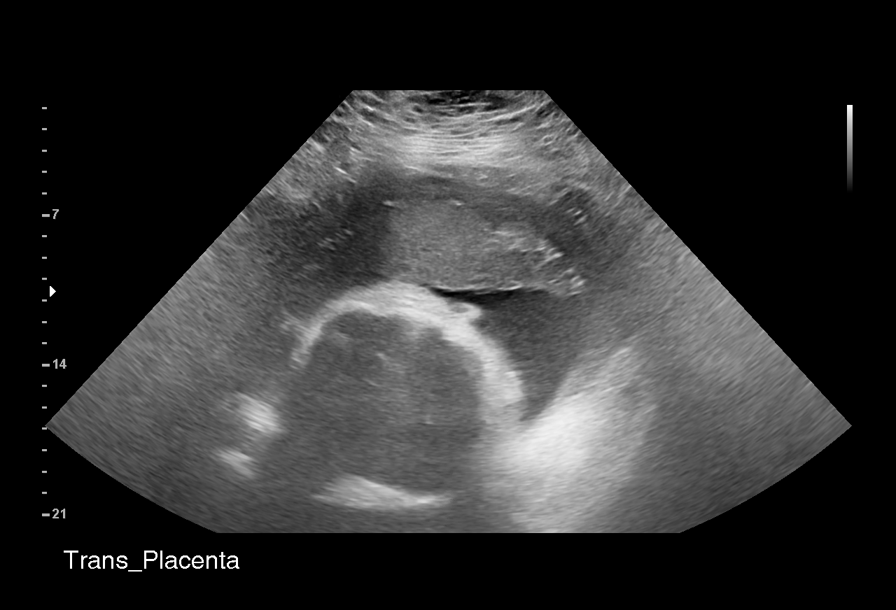
[im 11/31]
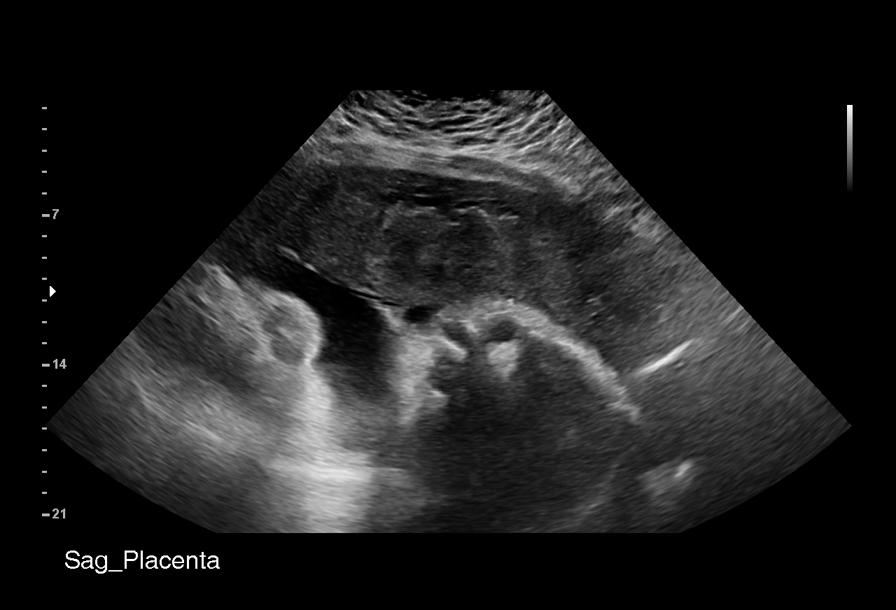
[im 13/31]
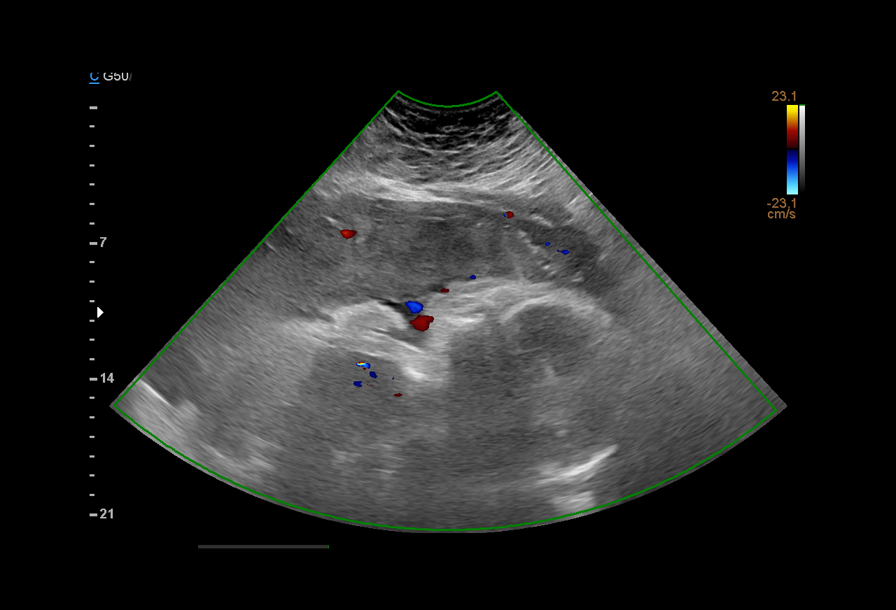
[im 16/31]
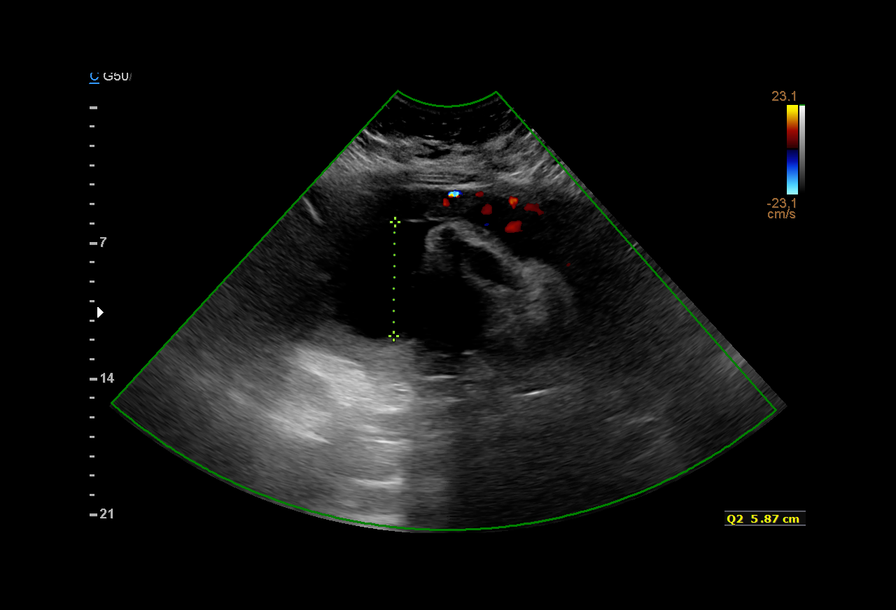
[im 18/31]
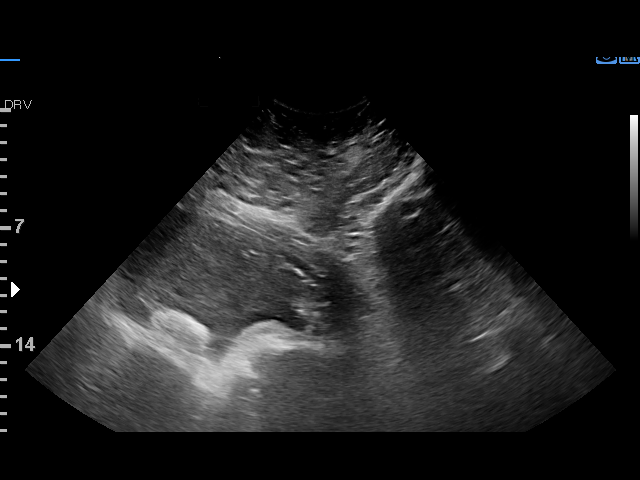
[im 21/31]
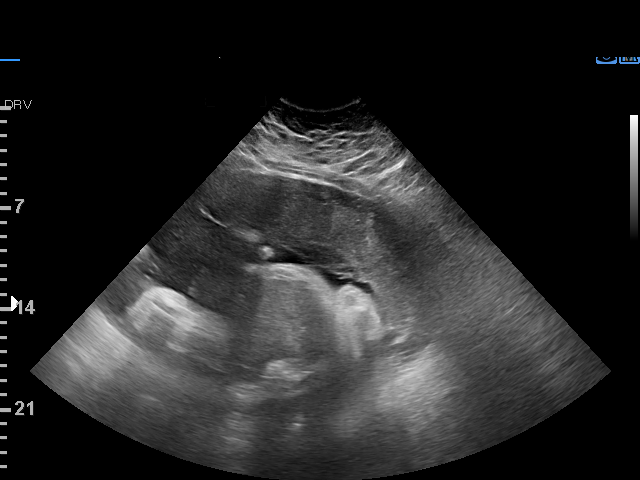
[im 23/31]
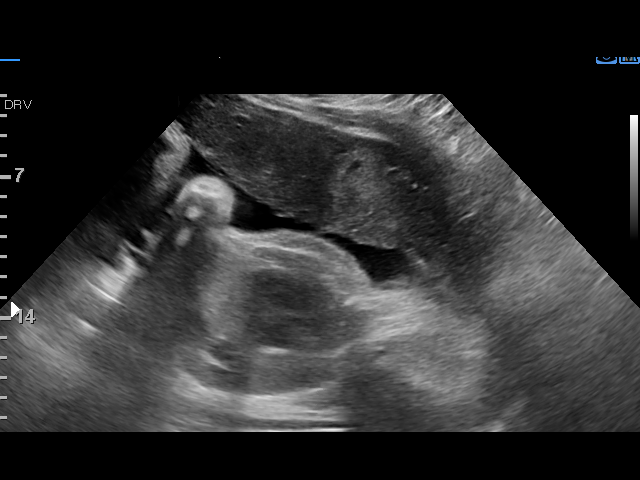
[im 25/31]
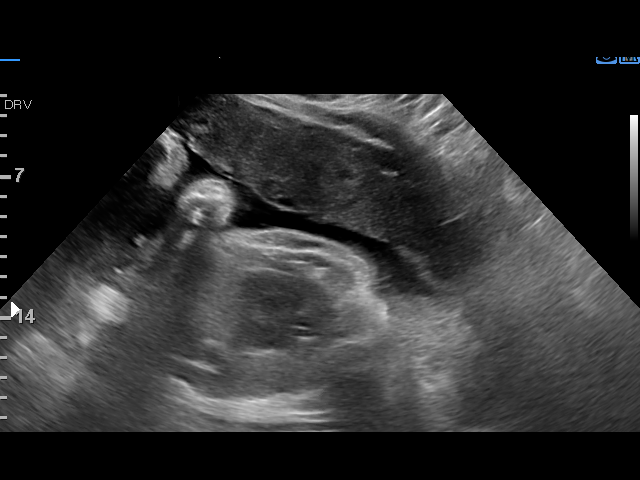
[im 27/31]
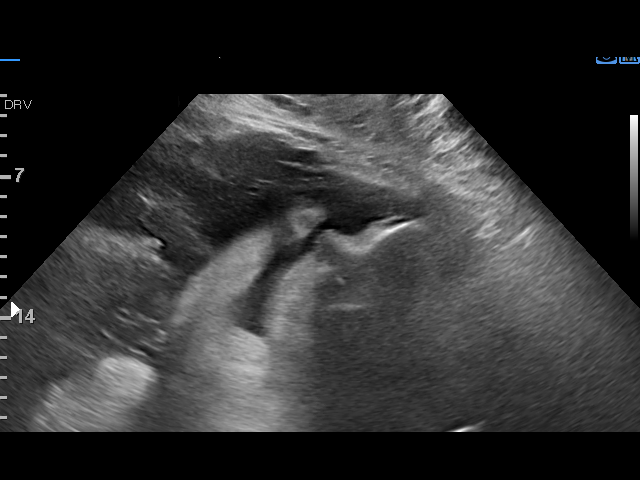
[im 29/31]
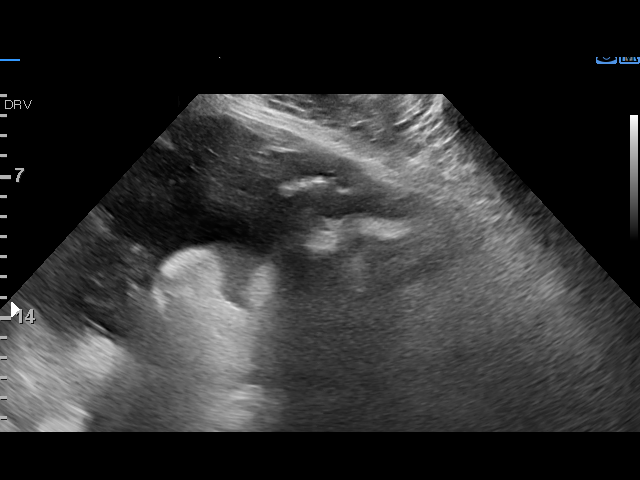

[13 of 28 positions shown; findings below may reference images not displayed]

MAU/Triage                                [HOSPITAL]

     STRESS                                            [facility]
 ----------------------------------------------------------------------

 ----------------------------------------------------------------------
Indications

  Fetal abnormality - other known or
  suspected (nuchal cord per [REDACTED] order)
  35 weeks gestation of pregnancy
  Maternal morbid obesity  317 lbs
  Decreased fetal movements, third trimester,
  unspecified
 ----------------------------------------------------------------------
Vital Signs

 BMI:
Fetal Evaluation

 Num Of Fetuses:          1
 Fetal Heart Rate(bpm):   142
 Cardiac Activity:        Observed
 Presentation:            Cephalic
 Placenta:                Anterior

 Amniotic Fluid
 AFI FV:      Within normal limits

 AFI Sum(cm)     %Tile       Largest Pocket(cm)
 19.03           71

 RUQ(cm)       RLQ(cm)       LUQ(cm)        LLQ(cm)


 Comment:    Nuchal cord not seen on today's ultrasound.
Biophysical Evaluation
 Amniotic F.V:   Within normal limits       F. Tone:         Observed
 F. Movement:    Observed                   Score:           [DATE]
 F. Breathing:   Observed
OB History

 Gravidity:    2         Term:   0        Prem:   0        SAB:   1
 TOP:          0       Ectopic:  0        Living: 0
Gestational Age

 LMP:           35w 5d        Date:  12/15/17                 EDD:   09/21/18
 Best:          35w 5d     Det. By:  LMP  (12/15/17)          EDD:   09/21/18
Impression

 Biophysical profile [DATE]
 Decreased fetal movement
 BMI >50
Recommendations

 Management per inpatient.

## 2019-09-06 IMAGING — US US MFM FETAL BPP WO NON STRESS
1 series · 13 of 20 positions shown · non-contrast
Comparison: none

[Series 1: us mfm fetal bpp wo non stress · 20 acquisitions, 13 frames shown]
[im 1/20]
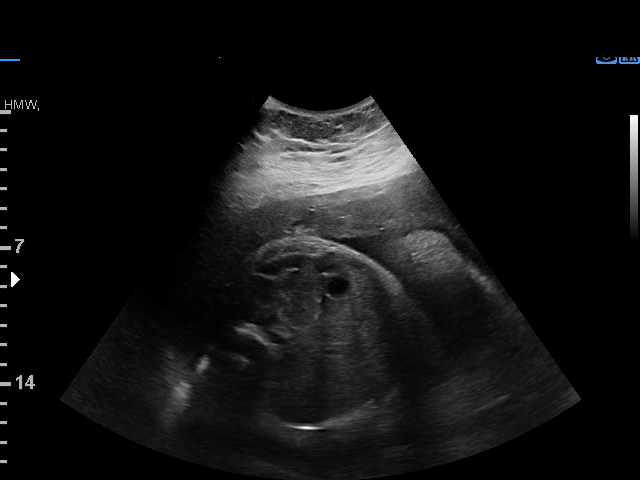
[im 3/20]
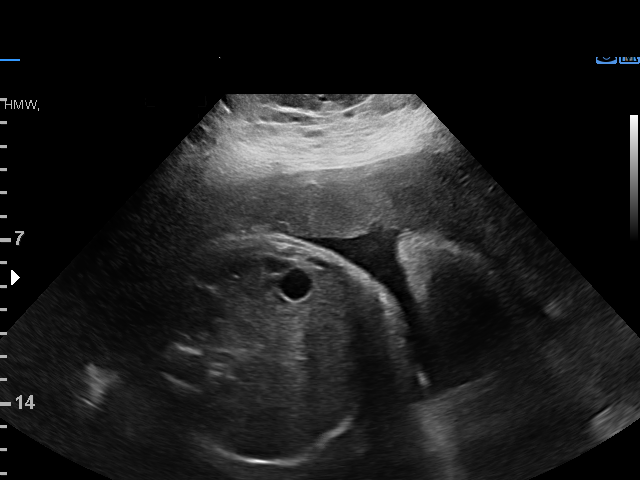
[im 4/20]
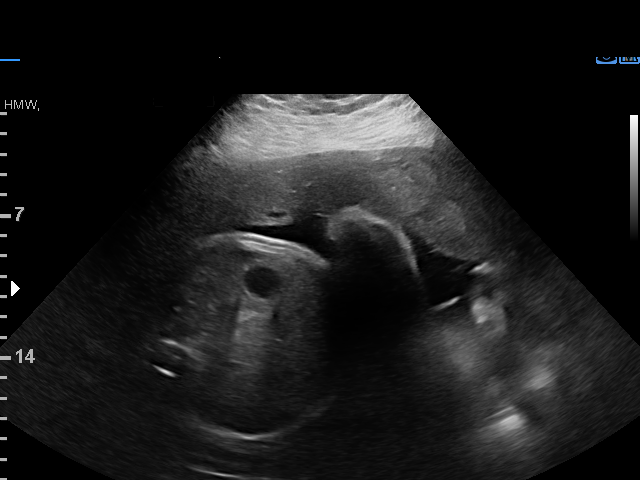
[im 6/20]
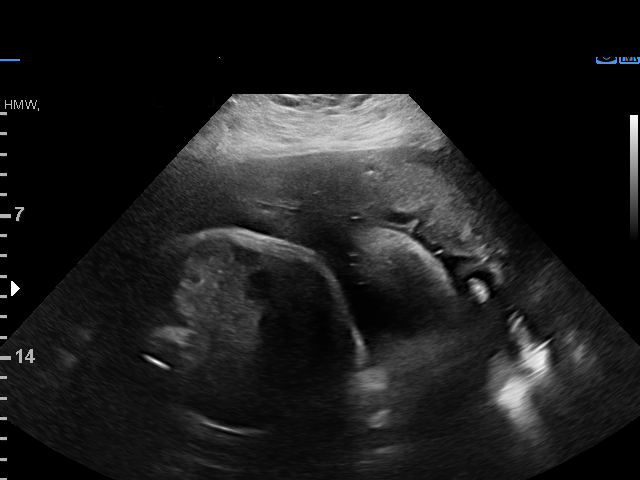
[im 7/20]
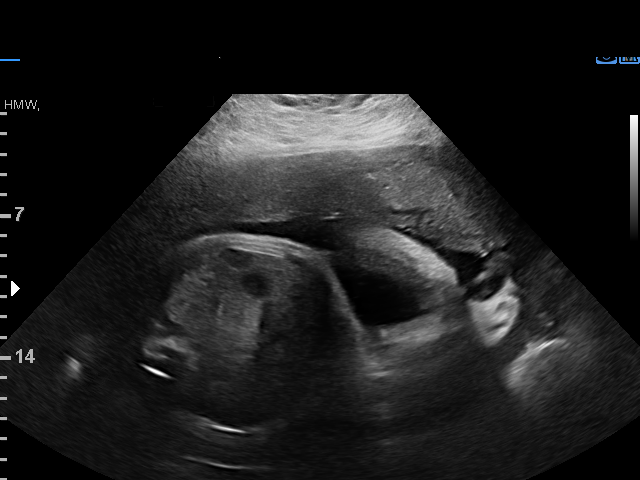
[im 9/20]
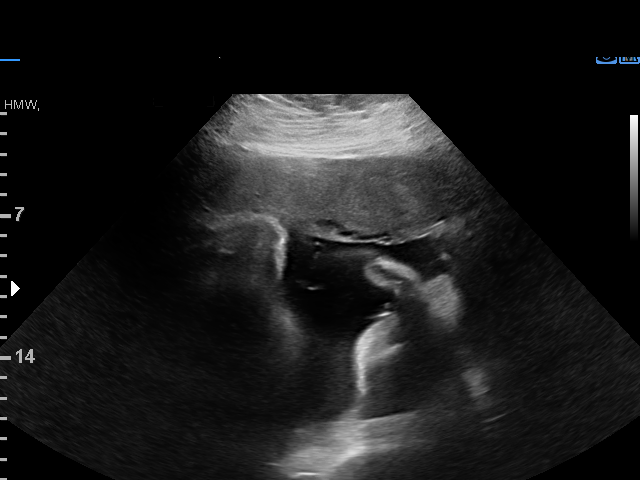
[im 11/20]
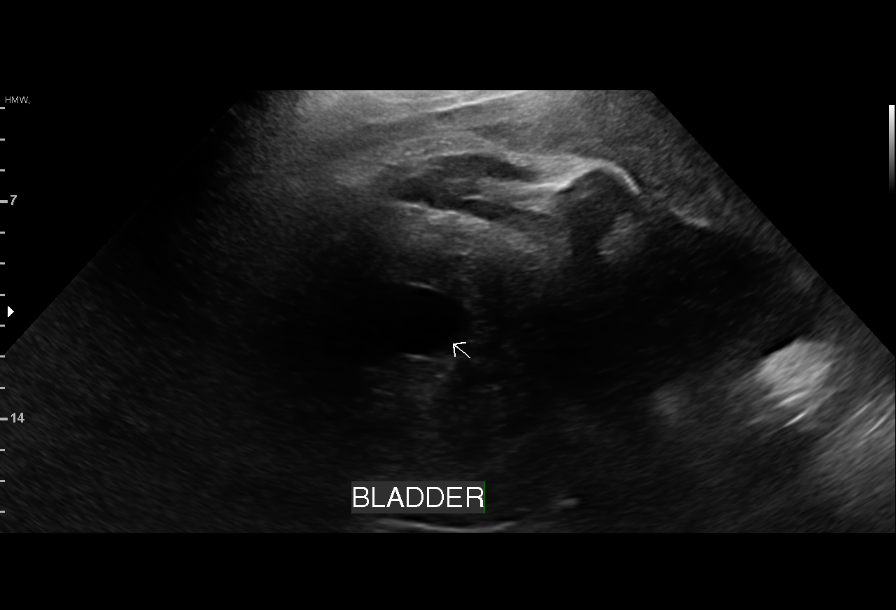
[im 12/20]
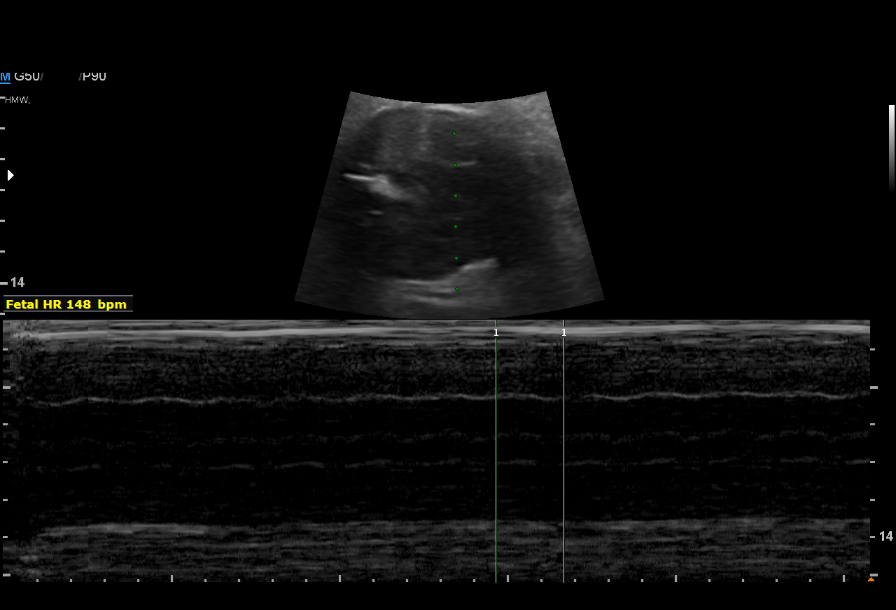
[im 14/20]
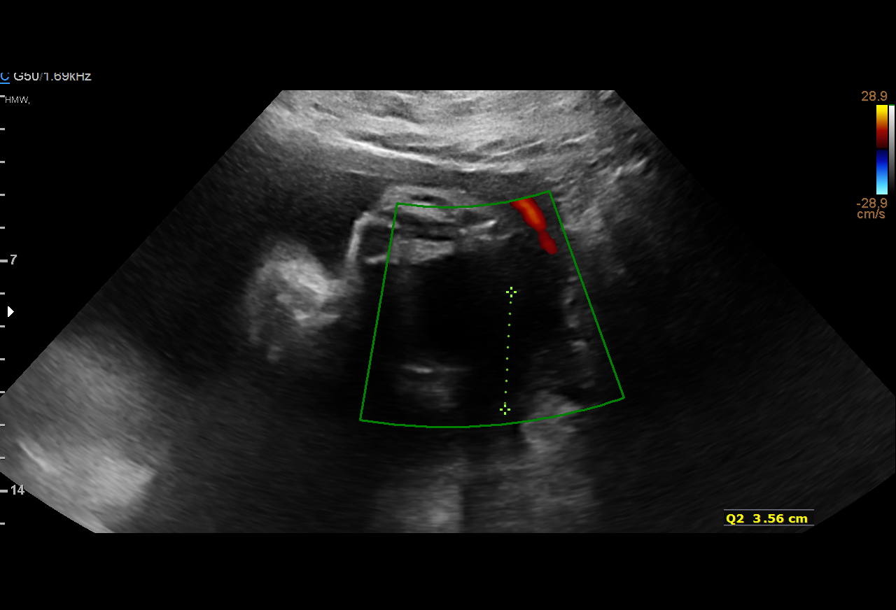
[im 15/20]
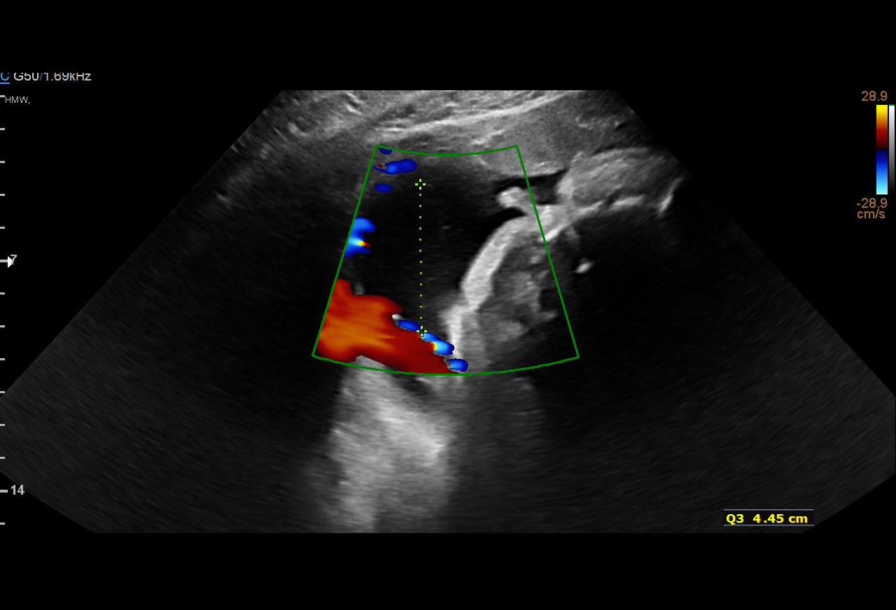
[im 17/20]
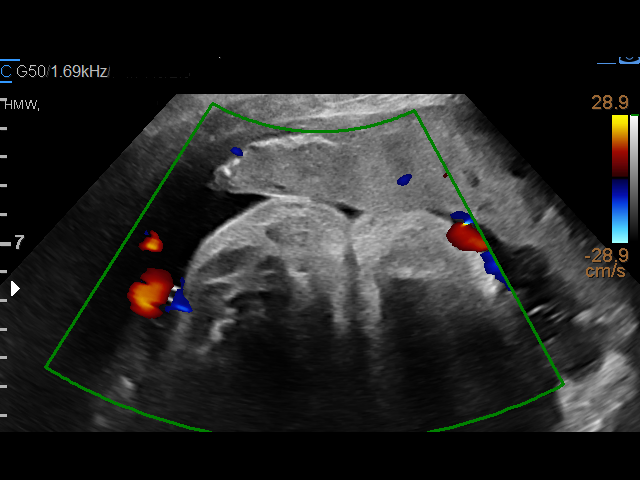
[im 18/20]
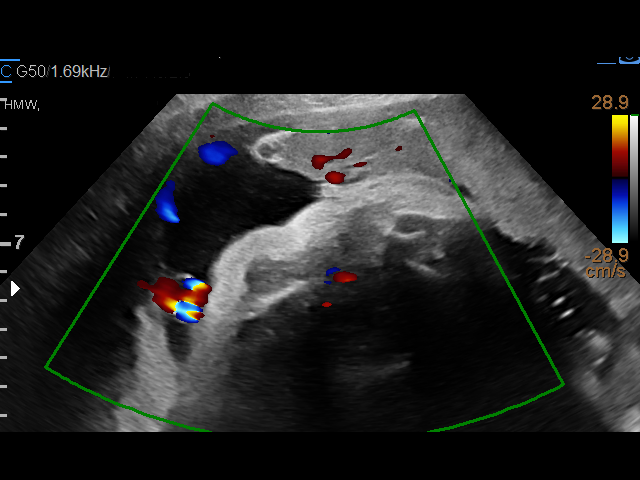
[im 20/20]
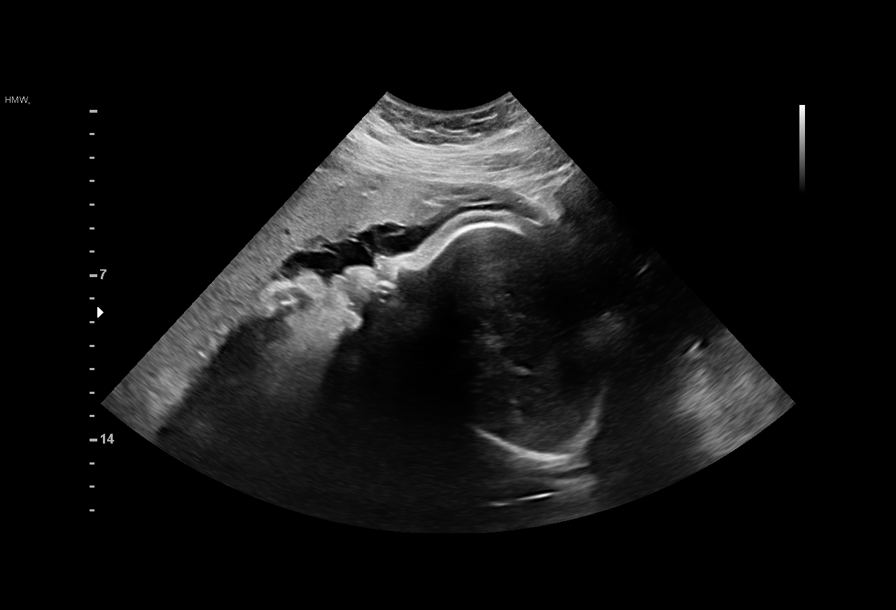

[13 of 20 positions shown; findings below may reference images not displayed]

Care                                     [HOSPITAL]

     STRESS                                            [facility]
 ----------------------------------------------------------------------

 ----------------------------------------------------------------------
Indications

  36 weeks gestation of pregnancy
  Fetal abnormality - other known or
  suspected (nuchal cord per [REDACTED] order)
  Maternal morbid obesity  317 lbs
  Decreased fetal movements, third trimester,
  unspecified
 ----------------------------------------------------------------------
Vital Signs

 BMI:
Fetal Evaluation

 Num Of Fetuses:         1
 Fetal Heart Rate(bpm):  148
 Cardiac Activity:       Observed
 Presentation:           Cephalic

 Amniotic Fluid
 AFI FV:      Within normal limits

 AFI Sum(cm)     %Tile       Largest Pocket(cm)
 17.12           64

 RUQ(cm)       RLQ(cm)       LUQ(cm)        LLQ(cm)

Biophysical Evaluation

 Amniotic F.V:   Within normal limits       F. Tone:        Observed
 F. Movement:    Observed                   Score:          [DATE]
 F. Breathing:   Observed
OB History

 Gravidity:    2         Term:   0        Prem:   0        SAB:   1
 TOP:          0       Ectopic:  0        Living: 0
Gestational Age

 LMP:           36w 1d        Date:  12/15/17                 EDD:   09/21/18
 Best:          36w 1d     Det. By:  LMP  (12/15/17)          EDD:   09/21/18
Anatomy

 Thoracic:              Appears normal         Bladder:                Appears normal
 Stomach:               Appears normal, left
                        sided
Impression

 Biophysical profile [DATE]
Recommendations

 Follow up as clinically indicated.

## 2019-09-08 ENCOUNTER — Ambulatory Visit: Payer: Medicaid Other | Admitting: Obstetrics and Gynecology

## 2019-11-15 NOTE — Progress Notes (Signed)
PCP:  Marshell Garfinkel, MD   Chief Complaint  Patient presents with  . Gynecologic Exam    Annual Exam     HPI:      Ms. Paige Blanchard is a 24 y.o. G2P0010 whose LMP was Patient's last menstrual period was 11/06/2019., presents today for her annual examination.  Her menses are regular every month, lasting 5-6  Days, mod flow.  Dysmenorrhea mild, occurring first 1-2 days of flow. She does not have intermenstrual bleeding.  Sex activity: not currently active; may be interested in IUD. Did depo in past with wt gain.  Last Pap: 04/22/17  Results were: low-grade squamous intraepithelial neoplasia (LGSIL - encompassing HPV,mild dysplasia,CIN I) /no HPV DNA tested. Repeat pap smear due.  Hx of STDs: none  Has had increased vag d/c for many months, thicky and excessive. No fishy odor but has smell. No vag irritation. Has pelvic discomfort with pushing on abd.  There is no FH of breast cancer. There is no FH of ovarian cancer. The patient does not do self-breast exams.  Tobacco use: The patient denies current or previous tobacco use. Alcohol use: none No drug use.  Exercise: moderately active  She does get adequate calcium but not Vitamin D in her diet.   Past Medical History:  Diagnosis Date  . Dysmenorrhea   . Migraine with aura   . Vitamin D deficiency     Past Surgical History:  Procedure Laterality Date  . abcessed tooth  03/14/2017   drained in ED of [facility]  . HERNIA REPAIR     AGE 32  . TONSILLECTOMY AND ADENOIDECTOMY    . TUBES IN EAR    . WISDOM TOOTH EXTRACTION  01/2017    Family History  Problem Relation Age of Onset  . Diabetes Mother   . Hypertension Mother   . Heart disease Father   . Diabetes Maternal Grandmother        TYPE 2  . Diabetes Maternal Grandfather        TYPE 2  . Breast cancer Other   . Cancer Other        PROSTATE    Social History   Socioeconomic History  . Marital status: Single    Spouse name: Not on file  . Number of children: 0    . Years of education: Not on file  . Highest education level: Not on file  Occupational History  . Occupation: Dentist: [employer]  Tobacco Use  . Smoking status: Never Smoker  . Smokeless tobacco: Never Used  Vaping Use  . Vaping Use: Never used  Substance and Sexual Activity  . Alcohol use: No  . Drug use: No  . Sexual activity: Yes    Birth control/protection: None  Other Topics Concern  . Not on file  Social History Narrative  . Not on file   Social Determinants of Health   Financial Resource Strain:   . Difficulty of Paying Living Expenses: Not on file  Food Insecurity:   . Worried About Programme researcher, broadcasting/film/video in the Last Year: Not on file  . Ran Out of Food in the Last Year: Not on file  Transportation Needs:   . Lack of Transportation (Medical): Not on file  . Lack of Transportation (Non-Medical): Not on file  Physical Activity:   . Days of Exercise per Week: Not on file  . Minutes of Exercise per Session: Not on file  Stress:   .  Feeling of Stress : Not on file  Social Connections:   . Frequency of Communication with Friends and Family: Not on file  . Frequency of Social Gatherings with Friends and Family: Not on file  . Attends Religious Services: Not on file  . Active Member of Clubs or Organizations: Not on file  . Attends Banker Meetings: Not on file  . Marital Status: Not on file  Intimate Partner Violence:   . Fear of Current or Ex-Partner: Not on file  . Emotionally Abused: Not on file  . Physically Abused: Not on file  . Sexually Abused: Not on file    No current outpatient medications on file.     ROS:  Review of Systems  Constitutional: Negative for fatigue, fever and unexpected weight change.  Respiratory: Negative for cough, shortness of breath and wheezing.   Cardiovascular: Negative for chest pain, palpitations and leg swelling.  Gastrointestinal: Negative for blood in stool, constipation, diarrhea, nausea  and vomiting.  Endocrine: Negative for cold intolerance, heat intolerance and polyuria.  Genitourinary: Positive for vaginal discharge. Negative for dyspareunia, dysuria, flank pain, frequency, genital sores, hematuria, menstrual problem, pelvic pain, urgency, vaginal bleeding and vaginal pain.  Musculoskeletal: Negative for back pain, joint swelling and myalgias.  Skin: Negative for rash.  Neurological: Negative for dizziness, syncope, light-headedness, numbness and headaches.  Hematological: Negative for adenopathy.  Psychiatric/Behavioral: Negative for agitation, confusion, sleep disturbance and suicidal ideas. The patient is not nervous/anxious.    BREAST: No symptoms   Objective: Ht 5\' 4"  (1.626 m)   Wt (!) 306 lb 6.4 oz (139 kg)   LMP 11/06/2019   BMI 52.59 kg/m    Physical Exam Constitutional:      Appearance: She is well-developed.  Genitourinary:     Vulva, cervix, uterus, right adnexa and left adnexa normal.     No vulval lesion or tenderness noted.     Vaginal discharge present.     No vaginal erythema or tenderness.     No cervical polyp.     Uterus is not enlarged or tender.     No right or left adnexal mass present.     Right adnexa not tender.     Left adnexa not tender.     Genitourinary Comments: VAG D/C LOOKS WNL  Neck:     Thyroid: No thyromegaly.  Cardiovascular:     Rate and Rhythm: Normal rate and regular rhythm.     Heart sounds: Normal heart sounds. No murmur heard.   Pulmonary:     Effort: Pulmonary effort is normal.     Breath sounds: Normal breath sounds.  Chest:     Breasts:        Right: No mass, nipple discharge, skin change or tenderness.        Left: No mass, nipple discharge, skin change or tenderness.  Abdominal:     Palpations: Abdomen is soft.     Tenderness: There is abdominal tenderness in the right lower quadrant, suprapubic area and left lower quadrant. There is no guarding or rebound.  Musculoskeletal:        General: Normal  range of motion.     Cervical back: Normal range of motion.  Neurological:     General: No focal deficit present.     Mental Status: She is alert and oriented to person, place, and time.     Cranial Nerves: No cranial nerve deficit.  Skin:    General: Skin is warm and dry.  Psychiatric:        Mood and Affect: Mood normal.        Behavior: Behavior normal.        Thought Content: Thought content normal.        Judgment: Judgment normal.  Vitals reviewed.     Results: Results for orders placed or performed in visit on 11/16/19 (from the past 24 hour(s))  POCT Wet Prep with KOH     Status: Normal   Collection Time: 11/16/19 10:57 AM  Result Value Ref Range   Trichomonas, UA Negative    Clue Cells Wet Prep HPF POC neg    Epithelial Wet Prep HPF POC     Yeast Wet Prep HPF POC neg    Bacteria Wet Prep HPF POC     RBC Wet Prep HPF POC     WBC Wet Prep HPF POC     KOH Prep POC Negative Negative    Assessment/Plan: Encounter for annual routine gynecological examination  Cervical cancer screening - Plan: Cytology - PAP  Screening for STD (sexually transmitted disease) - Plan: Cytology - PAP  Vaginal discharge - Plan: NuSwab Vaginitis (VG), POCT Wet Prep with KOH; neg wet prep, check STDs and culture. Will f/u if pos. If neg, reassurance re: normal physiologic d/c.   Encounter for other general counseling or advice on contraception--IUD discussed. Pt to f/u if desires. Most likely mirena   GYN counsel adequate intake of calcium and vitamin D, diet and exercise     F/U  Return in about 1 year (around 11/15/2020).  Keylah Darwish B. Darrah Dredge, PA-C 11/16/2019 10:57 AM

## 2019-11-15 NOTE — Patient Instructions (Signed)
I value your feedback and entrusting us with your care. If you get a [facility] patient survey, I would appreciate you taking the time to let us know about your experience today. Thank you!  As of December 15, 2018, your lab results will be released to your MyChart immediately, before I even have a chance to see them. Please give me time to review them and contact you if there are any abnormalities. Thank you for your patience.  

## 2019-11-16 ENCOUNTER — Encounter: Payer: Self-pay | Admitting: Obstetrics and Gynecology

## 2019-11-16 ENCOUNTER — Ambulatory Visit (INDEPENDENT_AMBULATORY_CARE_PROVIDER_SITE_OTHER): Payer: Medicaid Other | Admitting: Obstetrics and Gynecology

## 2019-11-16 ENCOUNTER — Other Ambulatory Visit: Payer: Self-pay

## 2019-11-16 ENCOUNTER — Other Ambulatory Visit (HOSPITAL_COMMUNITY)
Admission: RE | Admit: 2019-11-16 | Discharge: 2019-11-16 | Disposition: A | Discharge status: Home / Self Care | Payer: BC Managed Care – PPO | Source: Ambulatory Visit | Attending: Obstetrics and Gynecology | Admitting: Obstetrics and Gynecology

## 2019-11-16 VITALS — Ht 64.0 in | Wt 306.4 lb

## 2019-11-16 DIAGNOSIS — Z124 Encounter for screening for malignant neoplasm of cervix: Secondary | ICD-10-CM | POA: Diagnosis present

## 2019-11-16 DIAGNOSIS — Z3009 Encounter for other general counseling and advice on contraception: Secondary | ICD-10-CM

## 2019-11-16 DIAGNOSIS — N898 Other specified noninflammatory disorders of vagina: Secondary | ICD-10-CM | POA: Diagnosis not present

## 2019-11-16 DIAGNOSIS — Z01419 Encounter for gynecological examination (general) (routine) without abnormal findings: Secondary | ICD-10-CM

## 2019-11-16 DIAGNOSIS — Z113 Encounter for screening for infections with a predominantly sexual mode of transmission: Secondary | ICD-10-CM | POA: Diagnosis not present

## 2019-11-16 DIAGNOSIS — Z0000 Encounter for general adult medical examination without abnormal findings: Secondary | ICD-10-CM

## 2019-11-16 LAB — POCT WET PREP WITH KOH
Clue Cells Wet Prep HPF POC: NEGATIVE
KOH Prep POC: NEGATIVE
Trichomonas, UA: NEGATIVE
Yeast Wet Prep HPF POC: NEGATIVE

## 2019-11-19 LAB — NUSWAB VAGINITIS (VG)
Candida albicans, NAA: NEGATIVE
Candida glabrata, NAA: NEGATIVE
Trich vag by NAA: NEGATIVE

## 2019-11-20 LAB — CYTOLOGY - PAP
Adequacy: ABSENT
Chlamydia: NEGATIVE
Comment: NEGATIVE
Comment: NORMAL
Diagnosis: NEGATIVE
Neisseria Gonorrhea: NEGATIVE

## 2020-03-22 ENCOUNTER — Ambulatory Visit: Payer: BC Managed Care – PPO | Admitting: Obstetrics

## 2020-05-14 ENCOUNTER — Encounter: Payer: Self-pay | Admitting: Obstetrics and Gynecology

## 2020-05-14 ENCOUNTER — Ambulatory Visit (INDEPENDENT_AMBULATORY_CARE_PROVIDER_SITE_OTHER): Payer: BC Managed Care – PPO | Admitting: Obstetrics and Gynecology

## 2020-05-14 ENCOUNTER — Other Ambulatory Visit: Payer: Self-pay

## 2020-05-14 VITALS — BP 126/84 | Wt 320.0 lb

## 2020-05-14 DIAGNOSIS — N83202 Unspecified ovarian cyst, left side: Secondary | ICD-10-CM | POA: Diagnosis not present

## 2020-05-14 DIAGNOSIS — R300 Dysuria: Secondary | ICD-10-CM

## 2020-05-14 DIAGNOSIS — N760 Acute vaginitis: Secondary | ICD-10-CM

## 2020-05-14 MED ORDER — METRONIDAZOLE 500 MG PO TABS: 500.0000 mg | ORAL_TABLET | Freq: Two times a day (BID) | ORAL | 0 refills | Status: AC

## 2020-05-14 NOTE — Progress Notes (Signed)
Obstetrics & Gynecology Office Visit   Chief Complaint  Patient presents with  . Ovarian Cyst  . Follow-up    History of Present Illness: 25 y.o. G2P0010 female who presents in follow up from an ER visit in [facility] yesterday.  She went to the ER due to "pudding-like" discharge. She also had pain on her sides and back.  When she would void and cough she would have lower back pain and side pain along with the discharge that's abnormal.  She had no STDs, no BV, no yeast.    She is mostly concerned about her abnormal discharge.  She denies itching, burning, irrition of her vagina.  She says that she got mixed messages at the ER.  She states that she got a shot in her muscle and was told she has PID.  There are no labs that support this diagnosis and the patient has no complaints today apart from her abnormal discharge.  She had an equivocal UA yesterday.   Pap smear: 11/16/2019, NILM  Ultrasound yesterday at [facility] (report) Impression: -Crenulated hypoechoic lesion in the left ovary likely representing involuting corpus luteal cyst. Recommend attention on follow-up.  -Unremarkable right ovary and uterus.   Spectral doppler was not performed.  Additional transabdominal images were notobtained.  3D reconstruction images were not obtained.   Please see below for data measurements:  LMP: 04/07/2020   Uterus: Sagittal 8.8 cm; AP 4.7 cm; Transverse 5.0 cm  Endometrium: 0.9 cm  Right ovary: Sagittal 3.3 cm; AP 1.6 cm; Transverse 2.0 cm  Left ovary: Sagittal 3.4 cm; AP 2.3 cm; Transverse 2.1 cm   Past Medical History:  Diagnosis Date  . Dysmenorrhea   . Migraine with aura   . Vitamin D deficiency     Past Surgical History:  Procedure Laterality Date  . abcessed tooth  03/14/2017   drained in ED of [facility]  . HERNIA REPAIR     AGE 62  . TONSILLECTOMY AND ADENOIDECTOMY    . TUBES IN EAR    . WISDOM TOOTH EXTRACTION  01/2017    Gynecologic History: Patient's last menstrual  period was 04/29/2020.  Obstetric History: G2P0010  Family History  Problem Relation Age of Onset  . Diabetes Mother   . Hypertension Mother   . Heart disease Father   . Diabetes Maternal Grandmother        TYPE 2  . Diabetes Maternal Grandfather        TYPE 2  . Breast cancer Other   . Cancer Other        PROSTATE    Social History   Socioeconomic History  . Marital status: Single    Spouse name: Not on file  . Number of children: 0  . Years of education: Not on file  . Highest education level: Not on file  Occupational History  . 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 Smoking status: Never Smoker  . Smokeless tobacco: Never Used  Vaping Use  . Vaping Use: Never used  Substance and Sexual Activity  . Alcohol use: No  . Drug use: No  . Sexual activity: Yes    Birth control/protection: None  Other Topics Concern  . Not on file  Social History Narrative  . Not on file   Social Determinants of Health   Financial Resource Strain: Not on file  Food Insecurity: Not on file  Transportation Needs: Not on file  Physical Activity: Not on file  Stress: Not on file  Social Connections: Not on file  Intimate Partner Violence: Not on file    Allergies  Allergen Reactions  . Peanut Oil Hives  . Peanut-Containing Drug Products Hives    Prior to Admission medications   Medication Sig Start Date End Date Taking? Authorizing Provider  doxycycline (VIBRAMYCIN) 100 MG capsule Take by mouth. 05/13/20 05/27/20 Yes [provider]    Review of Systems  Constitutional: Negative.   HENT: Negative.   Eyes: Negative.   Respiratory: Negative.   Cardiovascular: Negative.   Gastrointestinal: Negative.   Genitourinary: Negative.        See HPI  Musculoskeletal: Negative.   Skin: Negative.   Neurological: Negative.   Psychiatric/Behavioral: Negative.      Physical Exam BP 126/84   Wt (!) 320 lb (145.2 kg)   LMP 04/29/2020   BMI 54.93  kg/m  Patient's last menstrual period was 04/29/2020. Physical Exam Constitutional:      General: She is not in acute distress.    Appearance: Normal appearance.  HENT:     Head: Normocephalic and atraumatic.  Eyes:     General: No scleral icterus.    Conjunctiva/sclera: Conjunctivae normal.  Neurological:     General: No focal deficit present.     Mental Status: She is alert and oriented to person, place, and time.     Cranial Nerves: No cranial nerve deficit.  Psychiatric:        Mood and Affect: Mood normal.        Behavior: Behavior normal.        Judgment: Judgment normal.     Female chaperone present for pelvic and breast  portions of the physical exam  Assessment: 25 y.o. G42P0010 female here for  1. Acute vaginitis   2. Dysuria   3. Left ovarian cyst      Plan: Problem List Items Addressed This Visit   None   Visit Diagnoses    Acute vaginitis    -  Primary   Relevant Medications   metroNIDAZOLE (FLAGYL) 500 MG tablet   Dysuria       Relevant Orders   Urine Culture   Left ovarian cyst         Left ovarian cyst: I reviewed the images with the patient.  Recommend follow-up in about 3 months to assess resolution.  This is likely an involuting corpus luteal cyst.  She has no symptoms from this at this moment.  Dysuria: She has pain in her sides and her back and had an equivocal UA at [facility] yesterday.  We will send this for culture as they did not treat for infection.  Vaginitis: We will treat for acute vaginitis given the abnormal discharge.  There appears to be no evidence for bacterial vaginosis based on the results from yesterday at the ER.  However, her discharge is different from her normal.  Discussed treatment versus expectant management in this setting.  She would like to move forward with treatment given that the discharge is quite bothersome to her.  A total of 22 minutes were spent face-to-face with the patient as well as preparation, review,  communication, and documentation during this encounter.    Thomasene Mohair, MD 05/14/2020 2:34 PM

## 2020-05-18 LAB — URINE CULTURE

## 2020-06-05 ENCOUNTER — Other Ambulatory Visit: Payer: Self-pay

## 2020-06-05 ENCOUNTER — Encounter: Payer: Self-pay | Admitting: Obstetrics & Gynecology

## 2020-06-05 ENCOUNTER — Ambulatory Visit (INDEPENDENT_AMBULATORY_CARE_PROVIDER_SITE_OTHER): Payer: BC Managed Care – PPO | Admitting: Obstetrics & Gynecology

## 2020-06-05 VITALS — BP 120/80 | Ht 64.0 in | Wt 330.0 lb

## 2020-06-05 DIAGNOSIS — N83202 Unspecified ovarian cyst, left side: Secondary | ICD-10-CM

## 2020-06-05 DIAGNOSIS — R102 Pelvic and perineal pain: Secondary | ICD-10-CM | POA: Diagnosis not present

## 2020-06-05 MED ORDER — TRAMADOL HCL 50 MG PO TABS: 50.0000 mg | ORAL_TABLET | Freq: Four times a day (QID) | ORAL | 0 refills | Status: AC | PRN

## 2020-06-05 NOTE — Progress Notes (Signed)
   Gynecology Pelvic Pain Evaluation   Chief Complaint  Patient presents with  . Abdominal Pain    History of Present Illness:   Patient is a 25 y.o. G2P1011 who LMP was Patient's last menstrual period was 05/24/2020., presents today for a problem visit.  She complains of continued pain in bilateral lower quadrants since being diagnosed with ovarain cysts 2 weeks ago by an [place] at [facility] ER.  She has follow up for monitoring of these cysts already arranged.  They were small and simple and perhaps corpus luteal; no evdinece for neoplasm or torsion at that time.  She has noticed pain in lower quadrants without radiation or additional aymptoms, alleviated minimally by rest or IBF meds, and activity can worsen the pain; still mild -moderate in intensity.  Cycles remain regular.  She does not take any hormones.Marland Kitchen   PMHx: She  has a past medical history of Dysmenorrhea, Migraine with aura, and Vitamin D deficiency. Also,  has a past surgical history that includes Hernia repair; Tonsillectomy and adenoidectomy; TUBES IN EAR; Wisdom tooth extraction (01/2017); and abcessed tooth (03/14/2017)., family history includes Breast cancer in an other family member; Cancer in an other family member; Diabetes in her maternal grandfather, maternal grandmother, and mother; Heart disease in her father; Hypertension in her mother.,  reports that she has never smoked. She has never used smokeless tobacco. She reports that she does not drink alcohol and does not use drugs.  She has a current medication list which includes the following prescription(s): tramadol. Also, is allergic to peanut oil and peanut-containing drug products.  Review of Systems  Gastrointestinal: Positive for abdominal pain.  All other systems reviewed and are negative.   Objective: BP 120/80   Ht 5\' 4"  (1.626 m)   Wt (!) 330 lb (149.7 kg)   LMP 05/24/2020   BMI 56.64 kg/m  Physical Exam Constitutional:      General: She is not in acute  distress.    Appearance: She is well-developed.  Abdominal:     Palpations: Abdomen is soft.     Tenderness: There is abdominal tenderness in the right lower quadrant. There is no guarding or rebound. Negative signs include Murphy's sign and McBurney's sign.     Comments: Mild RLQ T to palpation, and not on Left  Musculoskeletal:        General: Normal range of motion.  Neurological:     Mental Status: She is alert and oriented to person, place, and time.  Skin:    General: Skin is warm and dry.  Vitals reviewed.     Female chaperone present for pelvic portion of the physical exam  Assessment: 25 y.o. 25 F with lower quadrant pains  1. Left ovarian cyst Noted recently, will cont to follow until resolution Keep [id number] appt 6/28 2. Pelvic pain Change short term analgesia to Tramadol to see if helps while assessing direction cyst (s) take.  Surgery and hormonal options discussed as well for diagnostic and therapeutic reasons.  7/28, MD, Annamarie Major Ob/Gyn, [facility] 06/05/2020  2:54 PM

## 2020-06-10 ENCOUNTER — Telehealth: Payer: Self-pay

## 2020-06-10 NOTE — Telephone Encounter (Signed)
Pt calling the after hour nurse 06/08/20 6:16am c/o having more abd pain after being recently seen for ov. Cysts.  Pt was adv to go to ED.  [phone]  Pt states she did not go to ED because there was no one to keep her one year old; pt has been taking 400mg  IBU every 6hrs; adv she can take 600mg  q6hrs or 800mg  q8hrs.  Pt wants to get in touch with SDJ; adv he is in the office today; will send this msg to him.

## 2020-06-10 NOTE — Telephone Encounter (Signed)
Left generic VM to call me back 

## 2020-07-02 ENCOUNTER — Ambulatory Visit: Payer: BC Managed Care – PPO | Attending: Obstetrics and Gynecology

## 2020-07-03 ENCOUNTER — Ambulatory Visit: Payer: BC Managed Care – PPO | Admitting: Obstetrics and Gynecology

## 2020-07-10 ENCOUNTER — Ambulatory Visit: Payer: BC Managed Care – PPO | Admitting: Obstetrics and Gynecology

## 2020-07-24 ENCOUNTER — Ambulatory Visit: Payer: BC Managed Care – PPO | Attending: Obstetrics and Gynecology

## 2020-08-09 ENCOUNTER — Telehealth: Payer: Self-pay

## 2020-08-09 NOTE — Telephone Encounter (Signed)
Pt calling;for the past 2-3 wks her nipples have been irritated and painful.  [phone]  Pt states no trauma; just hurts and is sore.  Tx to AW for scheduling.

## 2020-08-17 IMAGING — US US OB TRANSVAGINAL
1 of 2 series · 13 of 28 positions shown · non-contrast
Comparison: None.

CLINICAL DATA: Vaginal bleeding. Four weeks and 6 days pregnant by
last menstrual period. Quantitative beta HCG [DATE].

EXAM:
TRANSVAGINAL OB ULTRASOUND
TECHNIQUE: Transvaginal ultrasound was performed for complete evaluation of the
gestation as well as the maternal uterus, adnexal regions, and
pelvic cul-de-sac.

[Series 1: us ob transvaginal · 0.22mm/px · 41 acquisitions, 13 frames shown]
[im 2/41]
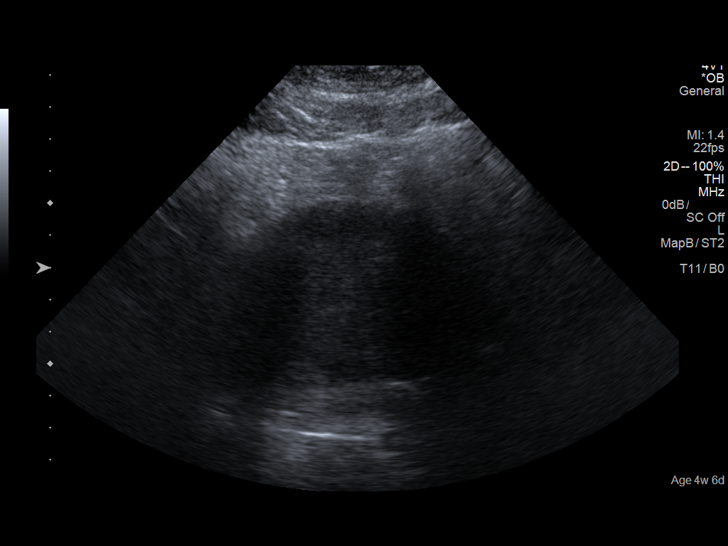
[im 5/41]
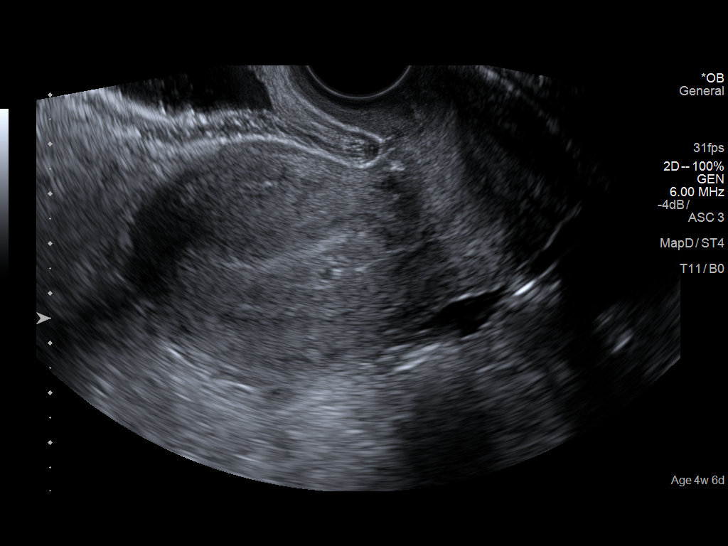
[im 8/41]
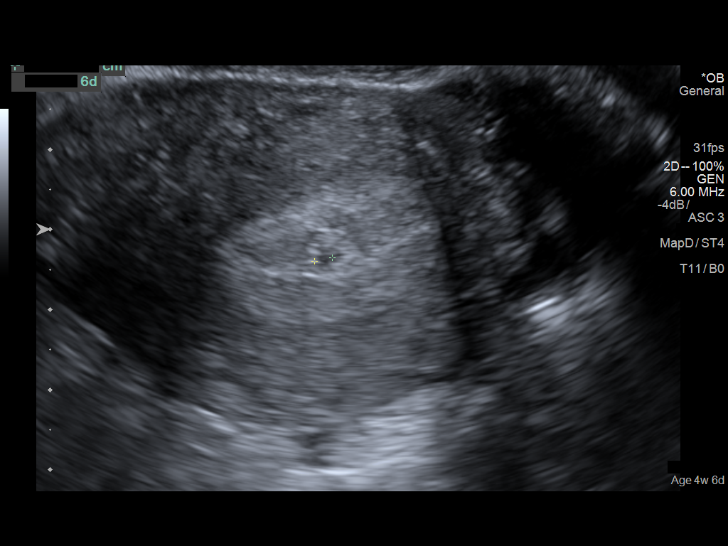
[im 11/41]
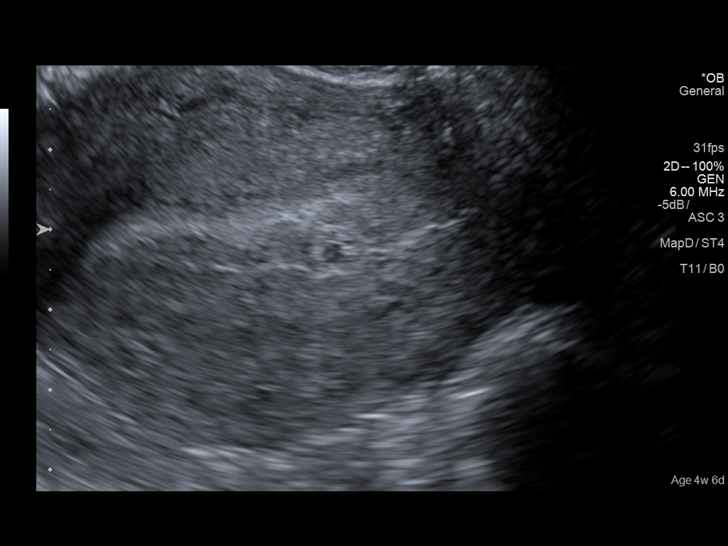
[im 14/41]
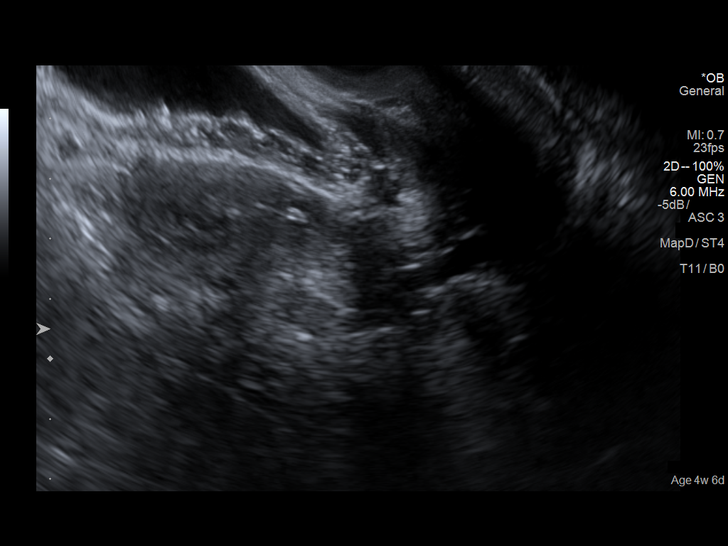
[im 17/41]
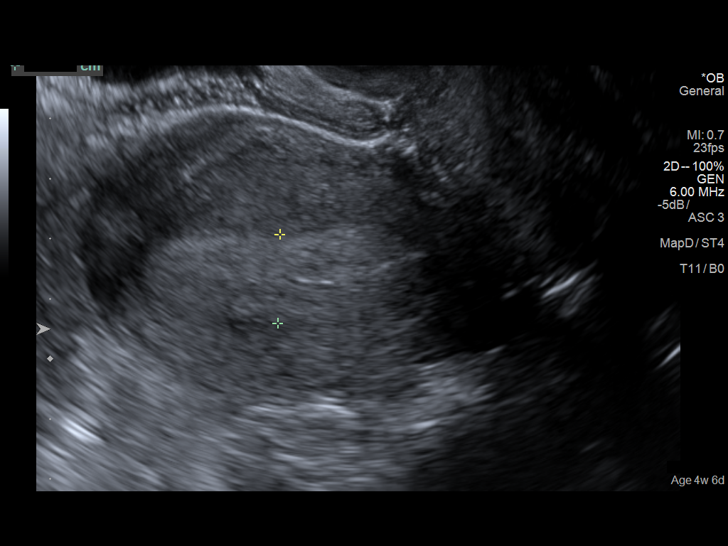
[im 22/41]
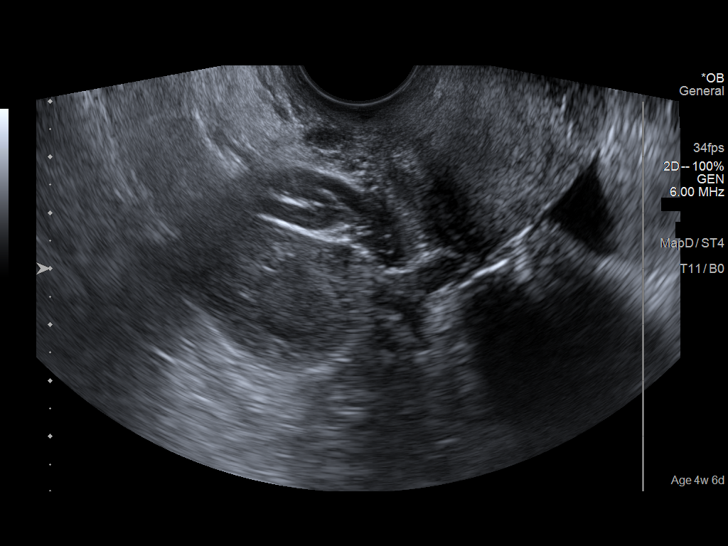
[im 25/41]
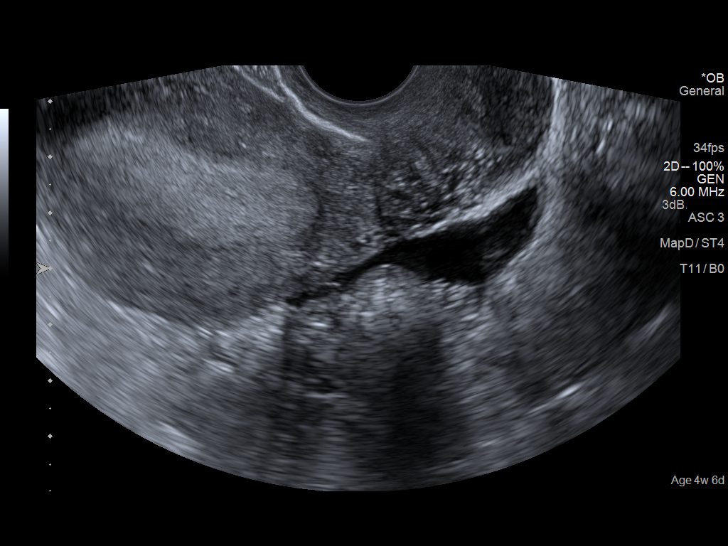
[im 28/41]
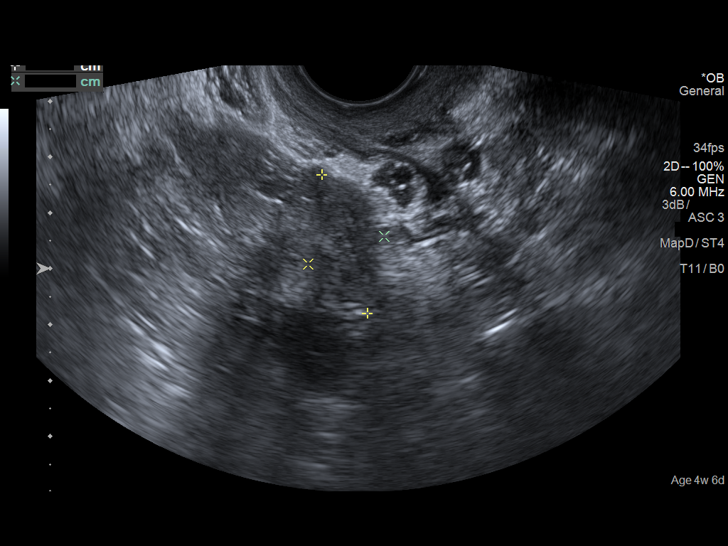
[im 31/41]
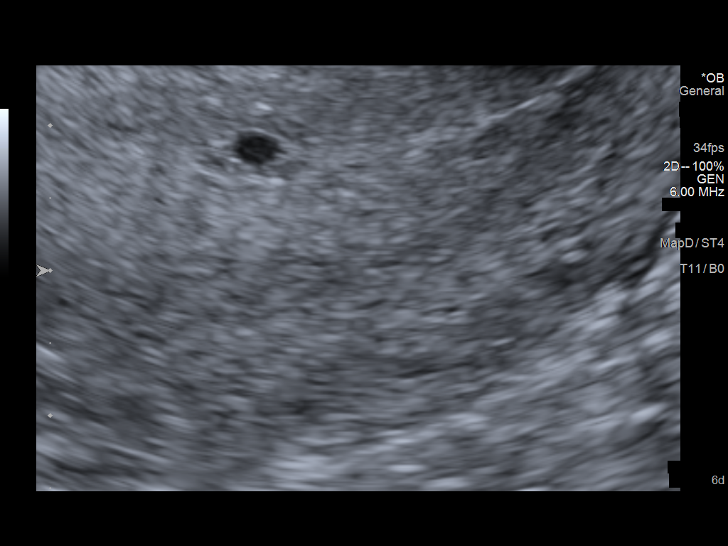
[im 34/41]
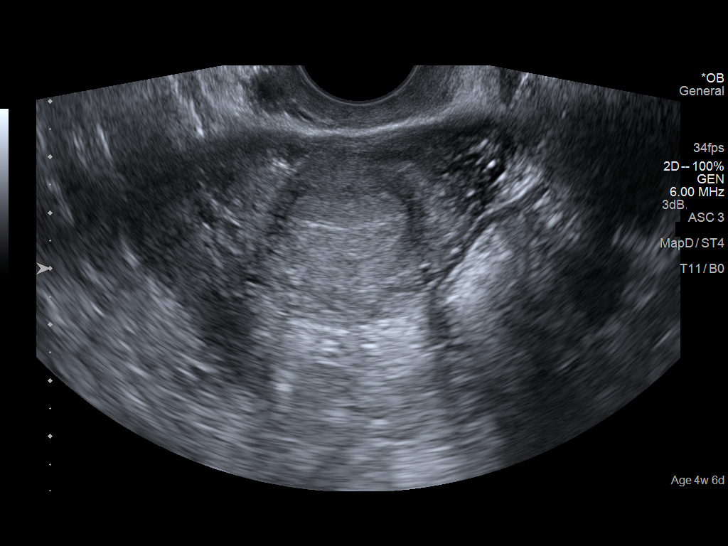
[im 37/41]
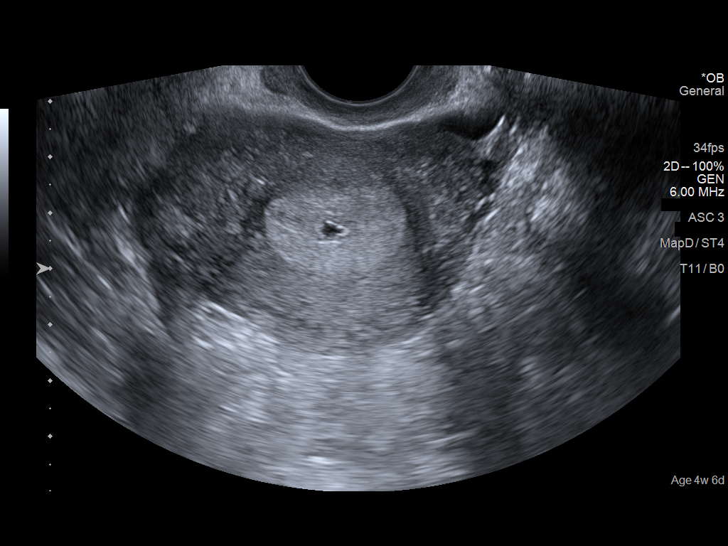
[im 41/41]
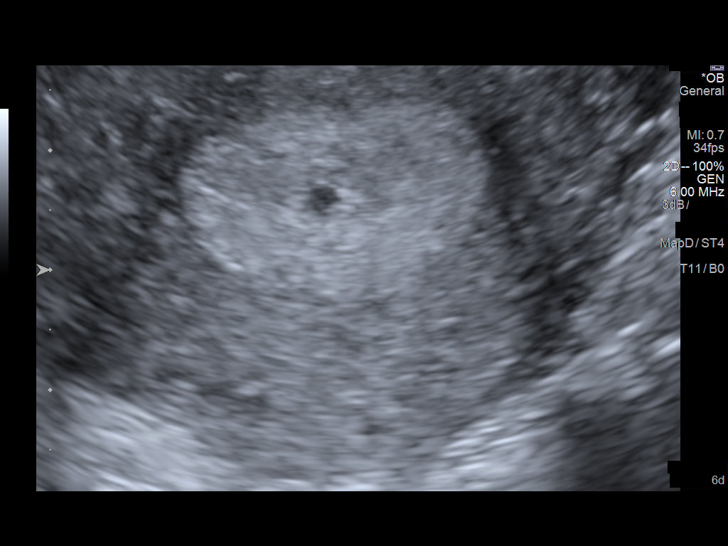

[13 of 28 positions shown; findings below may reference images not displayed]

FINDINGS: Intrauterine gestational sac: Single, small and slightly irregular.

Yolk sac:  Not visualized

Embryo:  Not visualized

Cardiac Activity: Not visualized

MSD: 2.9 mm mm   5 w   0 d

Subchorionic hemorrhage:  None visualized.

Maternal uterus/adnexae: Normal appearing ovaries with a right
ovarian corpus luteum noted. Trace free peritoneal fluid.
IMPRESSION: Probable early intrauterine gestational sac with measurements giving
an estimated gestational age of 5 weeks and 0 days with no yolk sac,
fetal pole, or cardiac activity yet visualized. Recommend follow-up
quantitative B-HCG levels and follow-up US in 14 days to assess
viability. This recommendation follows SRU consensus guidelines:
Diagnostic Criteria for Nonviable Pregnancy Early in the First
Trimester. N Engl J Med 0161; [DATE].

## 2020-08-20 NOTE — Progress Notes (Deleted)
    Ro, Lindalou Hose, MD   No chief complaint on file.   HPI:      Ms. Paige Blanchard is a 25 y.o. G2P0010 whose LMP was No LMP recorded., presents today for ***  There is no FH of breast cancer. There is no FH of ovarian cancer. The patient does not do self-breast exams.  Past Medical History:  Diagnosis Date   Dysmenorrhea    Migraine with aura    Vitamin D deficiency     Past Surgical History:  Procedure Laterality Date   abcessed tooth  03/14/2017   drained in ED of [facility]   HERNIA REPAIR     AGE 57   TONSILLECTOMY AND ADENOIDECTOMY     TUBES IN EAR     WISDOM TOOTH EXTRACTION  01/2017    Family History  Problem Relation Age of Onset   Diabetes Mother    Hypertension Mother    Heart disease Father    Diabetes Maternal Grandmother        TYPE 2   Diabetes Maternal Grandfather        TYPE 2   Breast cancer Other    Cancer Other        PROSTATE    Social History   Socioeconomic History   Marital status: Single    Spouse name: Not on file   Number of children: 0   Years of education: Not on file   Highest education level: Not on file  Occupational History   Occupation: Dentist: [employer]  Tobacco Use   Smoking status: Never   Smokeless tobacco: Never  Vaping Use   Vaping Use: Never used  Substance and Sexual Activity   Alcohol use: No   Drug use: No   Sexual activity: Yes    Birth control/protection: None  Other Topics Concern   Not on file  Social History Narrative   Not on file   Social Determinants of Health   Financial Resource Strain: Not on file  Food Insecurity: Not on file  Transportation Needs: Not on file  Physical Activity: Not on file  Stress: Not on file  Social Connections: Not on file  Intimate Partner Violence: Not on file    No outpatient medications prior to visit.   No facility-administered medications prior to visit.      ROS:  Review of Systems BREAST: No symptoms   OBJECTIVE:   Vitals:   There were no vitals taken for this visit.  Physical Exam  Results: No results found for this or any previous visit (from the past 24 hour(s)).   Assessment/Plan: No diagnosis found.    No orders of the defined types were placed in this encounter.     No follow-ups on file.  Shiro Ellerman B. Lane Kjos, PA-C 08/20/2020 4:00 PM

## 2020-08-21 ENCOUNTER — Ambulatory Visit: Payer: BC Managed Care – PPO | Admitting: Obstetrics and Gynecology

## 2021-11-06 ENCOUNTER — Ambulatory Visit: Payer: BC Managed Care – PPO | Admitting: Licensed Practical Nurse

## 2022-02-10 ENCOUNTER — Encounter: Payer: Self-pay | Admitting: Internal Medicine

## 2022-02-10 DIAGNOSIS — G4719 Other hypersomnia: Secondary | ICD-10-CM

## 2022-02-19 NOTE — Procedures (Signed)
[facility] Report Part I                                                               Phone: [phone] Fax: [phone]  Patient Name: Paige, Blanchard Acquisition Number: [id number]  Date of Birth: 09-Oct-1995 Acquisition Date: 02/10/2022  Referring Physician: Baxter Flattery L. Zychowicz, FNP     History: The patient is a 27 year old  who was referred for evaluation of . Medical History: Excessive daytime sleepiness, anxiety, asthma, headache, hernia, depression and obesity.   Medications: phentermine.  Procedure: This routine overnight polysomnogram was performed on the Alice 5 using the standard diagnostic protocol. This included 6 channels of EEG, 2 channels of EOG, chin EMG, bilateral anterior tibialis EMG, nasal/oral thermistor, PTAF (nasal pressure transducer), chest and abdominal wall movements, EKG, and pulse oximetry.  Description: The total recording time was 373.0 minutes. The total sleep time was 351.0 minutes. There were a total of 16.8 minutes of wakefulness after sleep onset for a goodsleep efficiency of 94.1%. The latency to sleep onset was shortat 5.2 minutes. The R sleep onset latency was prolonged at 188.0 minutes. Sleep parameters, as a percentage of the total sleep time, demonstrated 13.5% of sleep was in N1 sleep, 56.6% N2, 20.4% N3 and 9.5% R sleep. There were a total of 293 arousals for an arousal index of 50.1 arousals per hour of sleep that was elevated.  Respiratory monitoring demonstrated   snoring . There were 80 apneas and hypopneas for an Apnea Hypopnea Index of 13.7 apneas and hypopneas per hour of sleep. The REM related apnea hypopnea index was 17.9/hr of REM sleep compared to a NREM AHI of 13.0/hr.  The average duration of the respiratory events was 27.7 seconds with a maximum duration of 67.5 seconds. The respiratory events occurred in all positions. The respiratory events were associated with peripheral oxygen desaturations on the average to  94%. The lowest oxygen desaturation associated with a respiratory event was 78%. Additionally, the baseline oxygen saturation during wakefulness was 97%, during NREM sleep averaged 97%, and during REM sleep averaged  97%. The total duration of oxygen < 90% was 0.4 minutes.  Cardiac monitoring-  demonstrate transient cardiac decelerations associated with the apneas.  significant cardiac rhythm irregularities.   Periodic limb movement monitoring- demonstrated that there were 34 periodic limb movements for a periodic limb movement index of 5.8 periodic limb movements per hour of sleep.     Impression: This routine overnight polysomnogram demonstrated significant obstructive sleep apnea with an overall Apnea Hypopnea Index of 13.7 apneas and hypopneas per hour of sleep with the lowest desaturation to 78%.    There were few periodic limb movements that commonly are not significant. Sometimes these limb movements subside once the apnea is controlled. Clinical correlation would be suggested.   There was anelevated arousal index and a reduced REM percentage. Sleep efficiency was normal. These findings would appear to be due to the obstructive sleep apnea.  Recommendations:    A CPAP titration would be recommended due to the severity of the sleep apnea. Additionally, would recommend weight loss in a patient with a BMI of 37.7.     Allyne Gee, MD, [facility] Diplomate ABMS-Pulmonary, Critical Care and Sleep Medicine  Electronically  reviewed and digitally signed   [place] Report Part II  Phone: [phone] Fax: [phone]  Patient last name [facility] Neck Size  16.0  in. Acquisition [id number]  Patient first name Paige Blanchard Weight 330.0 lbs. Started 02/10/2022 at 10:39:03 PM  Birth date July 21, 1995 Height 64.0 in. Stopped 02/11/2022 at 6:17:09 AM  Age 65 BMI 56.6 lb/in2 Duration 373.0  Study Type Adult      Report generated by Juleen Starr, RPSGT   Sleep Data: Lights Out:  10:47:51 PM Sleep Onset: 10:53:03 PM  Lights On: 5:00:51 AM Sleep Efficiency: 94.1 %  Total Recording Time: 373.0 min Sleep Latency (from Lights Off) 5.2 min  Total Sleep Time (TST): 351.0 min R Latency (from Sleep Onset): 188.0 min  Sleep Period Time: 367.5 min Total number of awakenings: 15  Wake during sleep: 16.5 min Wake After Sleep Onset (WASO): 16.8 min   Sleep Data:         Arousal Summary: Stage  Latency from lights out (min) Latency from sleep onset (min) Duration (min) % Total Sleep Time  Normal values  N 1 5.2 0.0 47.5 13.5 (5%)  N 2 8.7 3.5 198.5 56.6 (50%)  N 3 36.7 31.5 71.5 20.4 (20%)  R 193.2 188.0 33.5 9.5 (25%)   Number Index  Spontaneous 145 24.8  Apneas & Hypopneas 76 13.0  RERAs 0 0.0       (Apneas & Hypopneas & RERAs)  (76) (13.0)  Limb Movement 72 12.3  Snore 0 0.0  TOTAL 293 50.1     Respiratory Data:  CA OA MA Apnea Hypopnea* A+ H RERA Total  Number 0 0 0 0 80 80 0 80  Mean Dur (sec) 0.0 0.0 0.0 0.0 27.7 27.7 0.0 27.7  Max Dur (sec) 0.0 0.0 0.0 0.0 67.5 67.5 0.0 67.5  Total Dur (min) 0.0 0.0 0.0 0.0 36.9 36.9 0.0 36.9  % of TST 0.0 0.0 0.0 0.0 10.5 10.5 0.0 10.5  Index (#/h TST) 0.0 0.0 0.0 0.0 13.7 13.7 0.0 13.7  *Hypopneas scored based on 4% or greater desaturation.  Sleep Stage:        REM NREM TST  AHI 17.9 13.0 13.7  RDI 17.9 13.0 13.7           Body Position Data:  Sleep (min) TST (%) REM (min) NREM (min) CA (#) OA (#) MA (#) HYP (#) AHI (#/h) RERA (#) RDI (#/h) Desat (#)  Supine 186.0 52.99 10.0 176.0 0 0 0 42 13.5 0 13.5 46  Non-Supine 165.00 47.01 23.50 141.50 0.00 0.00 0.00 38.00 13.82 0 13.82 41.00  Left: 129.9 37.01 23.5 106.4 0 0 0 19 8.8 0 8.8 20  Right: 35.1 10.00 0.0 35.1 0 0 0 19 32.5 0 32.5 21     Snoring: Total number of snoring episodes  0  Total time with snoring    min (   % of sleep)   Oximetry Distribution:             WK REM NREM TOTAL  Average (%)   97 97 97 97  < 90% 0.4 0.0 0.0 0.4  <  80% 0.1 0.0 0.0 0.1  < 70% 0.0 0.0 0.0 0.0  # of Desaturations* 2 7 44 53  Desat Index (#/hour) 5.5 12.5 8.3 9.1  Desat Max (%) 15 4 20 20  $ Desat Max Dur (sec) 13.0 69.0 91.0 91.0  Approx Min O2 during sleep 92  Approx min O2 during  a respiratory event 78  Was Oxygen added (Y/N) and final rate :    LPM  *Desaturations based on 3% or greater drop from baseline.   Cheyne Stokes Breathing: None Present   Heart Rate Summary:  Average Heart Rate During Sleep 88.8 bpm      Highest Heart Rate During Sleep (95th %) 97.0 bpm      Highest Heart Rate During Sleep 165 bpm (artifact)  Highest Heart Rate During Recording (TIB) 195 bpm (artifact)   Heart Rate Observations: Event Type # Events   Bradycardia 0 Lowest HR Scored: N/A  Sinus Tachycardia During Sleep 0 Highest HR Scored: N/A  Narrow Complex Tachycardia 0 Highest HR Scored: N/A  Wide Complex Tachycardia 0 Highest HR Scored: N/A  Asystole 0 Longest Pause: N/A  Atrial Fibrillation 0 Duration Longest Event: N/A  Other Arrythmias   Type:    Periodic Limb Movement Data: (Primary legs unless otherwise noted) Total # Limb Movement 75 Limb Movement Index 12.8  Total # PLMS 34 PLMS Index 5.8  Total # PLMS Arousals 32 PLMS Arousal Index 5.5  Percentage Sleep Time with PLMS 18.79mn (5.2 % sleep)  Mean Duration limb movements (secs) 220.8

## 2022-05-07 ENCOUNTER — Encounter: Payer: Self-pay | Admitting: Obstetrics and Gynecology

## 2022-05-07 ENCOUNTER — Ambulatory Visit (INDEPENDENT_AMBULATORY_CARE_PROVIDER_SITE_OTHER): Payer: Medicaid Other | Admitting: Obstetrics and Gynecology

## 2022-05-07 ENCOUNTER — Other Ambulatory Visit (HOSPITAL_COMMUNITY)
Admission: RE | Admit: 2022-05-07 | Discharge: 2022-05-07 | Disposition: A | Discharge status: Home / Self Care | Payer: Medicaid Other | Source: Ambulatory Visit | Attending: Obstetrics and Gynecology | Admitting: Obstetrics and Gynecology

## 2022-05-07 VITALS — BP 112/82 | HR 84 | Ht 64.0 in | Wt 329.7 lb

## 2022-05-07 DIAGNOSIS — N898 Other specified noninflammatory disorders of vagina: Secondary | ICD-10-CM | POA: Insufficient documentation

## 2022-05-07 DIAGNOSIS — N83202 Unspecified ovarian cyst, left side: Secondary | ICD-10-CM

## 2022-05-07 DIAGNOSIS — R102 Pelvic and perineal pain: Secondary | ICD-10-CM | POA: Diagnosis not present

## 2022-05-07 NOTE — Progress Notes (Signed)
Patient presents today due to vaginal bumps over the past few months. She states having "growing bumps that come and go and are painful when touched". Complaints of vaginal odor as well, denies discharge and new sexual partners.

## 2022-05-07 NOTE — Progress Notes (Signed)
HPI:      Ms. Paige Blanchard is a 27 y.o. G2P0010 who LMP was Patient's last menstrual period was 04/18/2022.  Subjective:   She presents today stating that over the last 5 to 6 months she is noticed some type of cystic lesion in the vagina and on her labia.  Mostly in the vagina she says they get to be about the size of a nickel and they are tender to touch.  They then resolved spontaneously.  She also complains of a new onset of vaginal odor.  She denies new sexual partners during that time.  She reports that she does not currently have 1 of these lesions.    Hx: The following portions of the patient's history were reviewed and updated as appropriate:             She  has a past medical history of Dysmenorrhea, Migraine with aura, and Vitamin D deficiency. She does not have any pertinent problems on file. She  has a past surgical history that includes Hernia repair; Tonsillectomy and adenoidectomy; TUBES IN EAR; Wisdom tooth extraction (01/2017); and abcessed tooth (03/14/2017). Her family history includes Breast cancer in an other family member; Cancer in an other family member; Diabetes in her maternal grandfather, maternal grandmother, and mother; Heart disease in her father; Hypertension in her mother. She  reports that she has never smoked. She has never used smokeless tobacco. She reports current alcohol use. She reports that she does not use drugs. She currently has no medications in their medication list. She is allergic to peanut oil and peanut-containing drug products.       Review of Systems:  Review of Systems  Constitutional: Denied constitutional symptoms, night sweats, recent illness, fatigue, fever, insomnia and weight loss.  Eyes: Denied eye symptoms, eye pain, photophobia, vision change and visual disturbance.  Ears/Nose/Throat/Neck: Denied ear, nose, throat or neck symptoms, hearing loss, nasal discharge, sinus congestion and sore throat.  Cardiovascular: Denied  cardiovascular symptoms, arrhythmia, chest pain/pressure, edema, exercise intolerance, orthopnea and palpitations.  Respiratory: Denied pulmonary symptoms, asthma, pleuritic pain, productive sputum, cough, dyspnea and wheezing.  Gastrointestinal: Denied, gastro-esophageal reflux, melena, nausea and vomiting.  Genitourinary: See HPI for additional information.  Musculoskeletal: Denied musculoskeletal symptoms, stiffness, swelling, muscle weakness and myalgia.  Dermatologic: Denied dermatology symptoms, rash and scar.  Neurologic: Denied neurology symptoms, dizziness, headache, neck pain and syncope.  Psychiatric: Denied psychiatric symptoms, anxiety and depression.  Endocrine: Denied endocrine symptoms including hot flashes and night sweats.   Meds:   No current outpatient medications on file prior to visit.   No current facility-administered medications on file prior to visit.      Objective:     Vitals:   05/07/22 0842  BP: 112/82  Pulse: 84   Filed Weights   05/07/22 0842  Weight: (!) 329 lb 11.2 oz (149.6 kg)              Physical examination   Pelvic:   Vulva: Normal appearance.  No lesions.  Patient pointed out a small area where one "used to be".  Small area palpated under the skin.  She also pointed out other areas where they occasionally have occurred.  Vagina: No lesions or abnormalities noted.  Support: Normal pelvic support.  Urethra No masses tenderness or scarring.  Meatus Normal size without lesions or prolapse.  Cervix: Normal appearance.  No lesions.  Anus: Normal exam.  No lesions.  Perineum: Normal exam.  No lesions.  Assessment:    G2P0010 Patient Active Problem List   Diagnosis Date Noted   Mastodynia 05/26/2017   LGSIL on Pap smear of cervix 05/21/2017   Recurrent major depressive disorder (HCC) 10/26/2014   Peanut allergy 11/24/2013   Class 3 severe obesity due to excess calories without serious comorbidity with body mass index  (BMI) of 50.0 to 59.9 in adult [facility]) 11/14/2013   Migraine 12/17/2011     1. Vaginal pain   2. Vaginal odor   3. Vaginal cyst     Not seeing a current example makes diagnosis difficult, however I think the most likely thing is a sebaceous cyst.  Doubt HSV. Vaginal odor possible BV as patient states that she has had this in the past.   Plan:            1.  Nuswab performed  2.  Should patient desire I have asked her to present to the office when she has the presence of 1 of these cysts so that we can better identify it.  I feel reassured that this is not HSV and not a serious condition based on its intermittent nature. Orders No orders of the defined types were placed in this encounter.   No orders of the defined types were placed in this encounter.     F/U  No follow-ups on file. I spent 23 minutes involved in the care of this patient preparing to see the patient by obtaining and reviewing her medical history (including labs, imaging tests and prior procedures), documenting clinical information in the electronic health record (EHR), counseling and coordinating care plans, writing and sending prescriptions, ordering tests or procedures and in direct communicating with the patient and medical staff discussing pertinent items from her history and physical exam.  Elonda Husky, M.D. 05/07/2022 9:09 AM

## 2022-05-08 LAB — CERVICOVAGINAL ANCILLARY ONLY
Bacterial Vaginitis (gardnerella): NEGATIVE
Candida Glabrata: NEGATIVE
Candida Vaginitis: NEGATIVE
Chlamydia: NEGATIVE
Comment: NEGATIVE
Comment: NEGATIVE
Comment: NEGATIVE
Comment: NEGATIVE
Comment: NEGATIVE
Comment: NORMAL
Neisseria Gonorrhea: NEGATIVE
Trichomonas: NEGATIVE

## 2022-05-29 ENCOUNTER — Encounter (INDEPENDENT_AMBULATORY_CARE_PROVIDER_SITE_OTHER): Payer: No Typology Code available for payment source | Admitting: Internal Medicine

## 2022-05-29 DIAGNOSIS — G4733 Obstructive sleep apnea (adult) (pediatric): Secondary | ICD-10-CM

## 2022-06-05 NOTE — Procedures (Signed)
[facility]  Polysomnogram Report Part I  Phone: [phone] Fax: [phone]  Patient Name: Paige Blanchard, Paige Blanchard Acquisition Number: [id number]  Date of Birth: [birth year] Acquisition Date: 05/29/2022  Referring Physician: Ayesha Rumpf, FNP     History: The patient is a 27 year old  . Medical History: excessive daytime sleepiness, anxiety, asthma, headache, hernia, deprresion, obesity.  Medications: phentermine.  Procedure: This routine overnight polysomnogram was performed on the Alice 5 using the standard CPAP protocol. This included 6 channels of EEG, 2 channels of EOG, chin EMG, bilateral anterior tibialis EMG, nasal/oral thermistor, PTAF (nasal pressure transducer), chest and abdominal wall movements, EKG, and pulse oximetry.  Description: The total recording time was 405.5 minutes. The total sleep time was 394.0 minutes. There were a total of 6.5 minutes of wakefulness after sleep onset for a very goodsleep efficiency of 97.2%. The latency to sleep onset was short at 5.0 minutes. The R sleep onset latency was prolonged at 183.0 minutes. Sleep parameters, as a percentage of the total sleep time, demonstrated 1.3% of sleep was in N1 sleep, 52.8% N2, 27.2% N3 and 18.8% R sleep. There were a total of 19 arousals for an arousal index of 2.9 arousals per hour of sleep that was normal.  Overall, there were a total of 20 respiratory events for a respiratory disturbance index, which includes apneas, hypopneas and RERAs (increased respiratory effort) of 3.0 respiratory events per hour of sleep during the pressure titration.  was initiated at  4 cmH2O at lights out, 10:53:40 PM. It was titrated in 1-2 cm increments to the final pressure of 8 cmH2O.   Additionally, the baseline oxygen saturation during wakefulness was 97%, during NREM sleep averaged 97%, and during REM sleep averaged 97%. The total duration of oxygen < 90% was 0.6 minutes.  Cardiac monitoring-  demonstrate transient  cardiac decelerations associated with the apneas.  significant cardiac rhythm irregularities.   Periodic limb movement monitoring- did not demonstrate periodic limb movements.   Impression: This patient's obstructive sleep apnea demonstrated significant improvement with the utilization of nasal .      Recommendations: Would recommend utilization of nasal  at 8cm of H2O.      A Resmed Airfit F30 full face mask, size-Medium , was used. Chin strap used during study-No . Humidifier used during study-Yes .     Yevonne Pax, MD, [facility] Diplomate ABMS-Pulmonary, Critical Care and Sleep Medicine  Electronically reviewed and digitally signed   [facility] CPAP/BIPAP Polysomnogram Report Part II Phone: [phone] Fax: [phone]  Patient last name Paige Blanchard Neck Size 16.0 in. Acquisition [phone]  Patient first name Paige Blanchard. Weight 330.0 lbs. Started 05/29/2022 at 10:32:40 PM  Birth date 09/12/1995 Height 64.0 in. Stopped 05/30/2022 at 5:43:46 AM  Age 27      Type Adult BMI 56.6 lb/in2 Duration 405.5  Candelaria Stagers, RPSGT   Reviewed by: Sheral Flow, RPSGT Sleep Data: Lights Out: 10:53:40 PM Sleep Onset: 10:58:40 PM  Lights On: 5:39:10 AM Sleep Efficiency: 97.2 %  Total Recording Time: 405.5 min Sleep Latency (from Lights Off) 5.0 min  Total Sleep Time (TST): 394.0 min R Latency (from Sleep Onset): 183.0 min  Sleep Period Time: 400.5 min Total number of awakenings: 7  Wake during sleep: 6.5 min Wake After Sleep Onset (WASO): 6.5 min   Sleep Data:         Arousal Summary: Stage  Latency from lights out (min) Latency from sleep onset (min) Duration (min) %  Total Sleep Time  Normal values  N 1 5.0 0.0 5.0 1.3 (5%)  N 2 5.5 0.5 208.0 52.8 (50%)  N 3 20.0 15.0 107.0 27.2 (20%)  R 188.0 183.0 74.0 18.8 (25%)   Number Index  Spontaneous 19 2.9  Apneas & Hypopneas 1 0.2  RERAs 2 0.3       (Apneas & Hypopneas & RERAs)  (3) (0.5)  Limb Movement 0 0.0  Snore 0 0.0  TOTAL  22 3.4     Respiratory Data:  CA OA MA Apnea Hypopnea* A+ H RERA Total  Number 6 0 0 6 12 18 2 20   Mean Dur (sec) 13.4 0.0 0.0 13.4 31.8 25.7 21.8 25.3  Max Dur (sec) 19.5 0.0 0.0 19.5 82.5 82.5 29.5 82.5  Total Dur (min) 1.3 0.0 0.0 1.3 6.4 7.7 0.7 8.4  % of TST 0.3 0.0 0.0 0.3 1.6 2.0 0.2 2.1  Index (#/h TST) 0.9 0.0 0.0 0.9 1.8 2.7 0.3 3.0  *Hypopneas scored based on 4% or greater desaturation.  Sleep Stage:      REM NREM TST  AHI 3.2 2.6 2.7  RDI 4.1 2.8 3.0          Body Position Data:  Sleep (min) TST (%) REM (min) NREM (min) CA (#) OA (#) MA (#) HYP (#) AHI (#/h) RERA (#) RDI (#/h) Desat (#)  Supine 302.0 76.65 74.0 228.0 6 0 0 12 3.6 2 4.0 54  Non-Supine 92.00 23.35 0.00 92.00 0.00 0.00 0.00 0.00 0.00 0 0.00 1.00  Right: 92.0 23.35 0.0 92.0 0 0 0 0 0.0 0 0.00 1    Snoring: Total number of snoring episodes  0  Total time with snoring    min (   % of sleep)   Oximetry Distribution:             WK REM NREM TOTAL  Average (%)   97 97 97 97  < 90% 0.1 0.4 0.1 0.6  < 80% 0.0 0.0 0.0 0.0  < 70% 0.0 0.0 0.0 0.0  # of Desaturations* 2 25 29  56  Desat Index (#/hour) 12.0 20.3 5.5 8.6  Desat Max (%) 5 10 10 10   Desat Max Dur (sec) 52.0 112.0 59.0 112.0  Approx Min O2 during sleep 87  Approx min O2 during a respiratory event 88  Was Oxygen added (Y/N) and final rate :    LPM  *Desaturations based on 3% or greater drop from baseline.   Cheyne Stokes Breathing: None Present    Heart Rate Summary:  Average Heart Rate During Sleep 85.4 bpm      Highest Heart Rate During Sleep (95th %) 92.0 bpm      Highest Heart Rate During Sleep 109 bpm      Highest Heart Rate During Recording (TIB) 123 bpm       Heart Rate Observations: Event Type # Events   Bradycardia 0 Lowest HR Scored: N/A  Sinus Tachycardia During Sleep 0 Highest HR Scored: N/A  Narrow Complex Tachycardia 0 Highest HR Scored: N/A  Wide Complex Tachycardia 0 Highest HR Scored: N/A  Asystole 0  Longest Pause: N/A  Atrial Fibrillation 0 Duration Longest Event: N/A  Other Arrythmias   Type:   Periodic Limb Movement Data: (Primary legs unless otherwise noted) Total # Limb Movement 0 Limb Movement Index 0.0  Total # PLMS    PLMS Index     Total # PLMS Arousals    PLMS Arousal Index  Percentage Sleep Time with PLMS   min (   % sleep)  Mean Duration limb movements (secs)        Brief Summary:     Starting pressure:  4 cm of H2O Maximum Pressure: 8 cm of H2O  Mask Type: Airfit F30 Mask Size: Medium  Humidifier used: Yes Chin Strap used: No  CFlex:  BiFlex:      IPAP Level (cmH2O) EPAP Level (cmH2O) Total Duration (min) Sleep Duration (min) Sleep (%) REM (%) CA  #) OA # MA # HYP #) AHI (#/hr) RERAs # RERAs (#/hr) RDI (#/hr)  4 4 126.0 121.0 96.0 0.0 0 0 0 3 1.5 1 0.5 2.0  5 5 18.4 17.4 94.6 0.0 0 0 0 2 6.9 0 0.0 6.9  6 6  64.0 64.0 100.0 40.6 0 0 0 2 1.9 0 0.0 1.9  7 7  139.2 139.2 100.0 12.4 6 0 0 4 4.3 1 0.4 4.7  8 8  51.7 51.2 99.0 58.4 0 0 0 1 1.2 0 0.0 1.2

## 2022-06-25 ENCOUNTER — Ambulatory Visit: Payer: Medicaid Other | Admitting: Obstetrics and Gynecology

## 2022-06-25 NOTE — Progress Notes (Deleted)
PCP:  Marshell Garfinkel, MD   No chief complaint on file.    HPI:      Ms. Paige Blanchard is a 27 y.o. G2P0010 whose LMP was No LMP recorded., presents today for her annual examination.  Her menses are regular every month, lasting 5-6  Days, mod flow.  Dysmenorrhea mild, occurring first 1-2 days of flow. She does not have intermenstrual bleeding.  Sex activity: not currently active; may be interested in IUD. Did depo in past with wt gain.  Last Pap: 11/16/19 Results were no abnormalities; 04/22/17  Results were: low-grade squamous intraepithelial neoplasia (LGSIL - encompassing HPV,mild dysplasia,CIN I) /no HPV DNA tested. Repeat pap smear due.  Hx of STDs: none  Has had increased vag d/c for many months, thicky and excessive. No fishy odor but has smell. No vag irritation. Has pelvic discomfort with pushing on abd.  There is no FH of breast cancer. There is no FH of ovarian cancer. The patient does not do self-breast exams.  Tobacco use: The patient denies current or previous tobacco use. Alcohol use: none No drug use.  Exercise: moderately active  She does get adequate calcium but not Vitamin D in her diet.   Past Medical History:  Diagnosis Date   Dysmenorrhea    Migraine with aura    Vitamin D deficiency     Past Surgical History:  Procedure Laterality Date   abcessed tooth  03/14/2017   drained in ED of [facility]   HERNIA REPAIR     AGE 89   TONSILLECTOMY AND ADENOIDECTOMY     TUBES IN EAR     WISDOM TOOTH EXTRACTION  01/2017    Family History  Problem Relation Age of Onset   Diabetes Mother    Hypertension Mother    Heart disease Father    Diabetes Maternal Grandmother        TYPE 2   Diabetes Maternal Grandfather        TYPE 2   Breast cancer Other    Cancer Other        PROSTATE    Social History   Socioeconomic History   Marital status: Single    Spouse name: Not on file   Number of children: 0   Years of education: Not on file   Highest education  level: Not on file  Occupational History   Occupation: Dentist: [employer]  Tobacco Use   Smoking status: Never   Smokeless tobacco: Never  Vaping Use   Vaping Use: Never used  Substance and Sexual Activity   Alcohol use: Yes    Comment: socially   Drug use: No   Sexual activity: Not Currently    Birth control/protection: Condom  Other Topics Concern   Not on file  Social History Narrative   Not on file   Social Determinants of Health   Financial Resource Strain: Low Risk  (08/19/2018)   Overall Financial Resource Strain (CARDIA)    Difficulty of Paying Living Expenses: Not hard at all  Food Insecurity: No Food Insecurity (08/19/2018)   Hunger Vital Sign    Worried About Running Out of Food in the Last Year: Never true    Ran Out of Food in the Last Year: Never true  Transportation Needs: No Transportation Needs (08/19/2018)   PRAPARE - Administrator, Civil Service (Medical): No    Lack of Transportation (Non-Medical): No  Physical Activity: Insufficiently Active (08/19/2018)  Exercise Vital Sign    Days of Exercise per Week: 7 days    Minutes of Exercise per Session: 20 min  Stress: No Stress Concern Present (08/19/2018)   [employer] of Occupational Health - Occupational Stress Questionnaire    Feeling of Stress : Not at all  Social Connections: Unknown (08/19/2018)   Social Connection and Isolation Panel [NHANES]    Frequency of Communication with Friends and Family: More than three times a week    Frequency of Social Gatherings with Friends and Family: More than three times a week    Attends Religious Services: More than 4 times per year    Active Member of [employer] or Organizations: Not on file    Attends Banker Meetings: Not on file    Marital Status: Not on file  Intimate Partner Violence: Not At Risk (08/19/2018)   Humiliation, Afraid, Rape, and Kick questionnaire    Fear of Current or Ex-Partner: No    Emotionally  Abused: No    Physically Abused: No    Sexually Abused: No    No current outpatient medications on file.     ROS:  Review of Systems  Constitutional:  Negative for fatigue, fever and unexpected weight change.  Respiratory:  Negative for cough, shortness of breath and wheezing.   Cardiovascular:  Negative for chest pain, palpitations and leg swelling.  Gastrointestinal:  Negative for blood in stool, constipation, diarrhea, nausea and vomiting.  Endocrine: Negative for cold intolerance, heat intolerance and polyuria.  Genitourinary:  Positive for vaginal discharge. Negative for dyspareunia, dysuria, flank pain, frequency, genital sores, hematuria, menstrual problem, pelvic pain, urgency, vaginal bleeding and vaginal pain.  Musculoskeletal:  Negative for back pain, joint swelling and myalgias.  Skin:  Negative for rash.  Neurological:  Negative for dizziness, syncope, light-headedness, numbness and headaches.  Hematological:  Negative for adenopathy.  Psychiatric/Behavioral:  Negative for agitation, confusion, sleep disturbance and suicidal ideas. The patient is not nervous/anxious.    BREAST: No symptoms   Objective: There were no vitals taken for this visit.   Physical Exam Constitutional:      Appearance: She is well-developed.  Genitourinary:     Vulva normal.     Genitourinary Comments: VAG D/C LOOKS WNL     Vaginal discharge present.     No vaginal erythema or tenderness.      Right Adnexa: not tender and no mass present.    Left Adnexa: not tender and no mass present.    No cervical polyp.     Uterus is not enlarged or tender.  Breasts:    Right: No mass, nipple discharge, skin change or tenderness.     Left: No mass, nipple discharge, skin change or tenderness.  Neck:     Thyroid: No thyromegaly.  Cardiovascular:     Rate and Rhythm: Normal rate and regular rhythm.     Heart sounds: Normal heart sounds. No murmur heard. Pulmonary:     Effort: Pulmonary  effort is normal.     Breath sounds: Normal breath sounds.  Abdominal:     Palpations: Abdomen is soft.     Tenderness: There is abdominal tenderness in the right lower quadrant, suprapubic area and left lower quadrant. There is no guarding or rebound.  Musculoskeletal:        General: Normal range of motion.     Cervical back: Normal range of motion.  Neurological:     General: No focal deficit present.     Mental  Status: She is alert and oriented to person, place, and time.     Cranial Nerves: No cranial nerve deficit.  Skin:    General: Skin is warm and dry.  Psychiatric:        Mood and Affect: Mood normal.        Behavior: Behavior normal.        Thought Content: Thought content normal.        Judgment: Judgment normal.  Vitals reviewed.     Results: No results found for this or any previous visit (from the past 24 hour(s)).   Assessment/Plan: Encounter for annual routine gynecological examination  Cervical cancer screening - Plan: Cytology - PAP  Screening for STD (sexually transmitted disease) - Plan: Cytology - PAP  Vaginal discharge - Plan: NuSwab Vaginitis (VG), POCT Wet Prep with KOH; neg wet prep, check STDs and culture. Will f/u if pos. If neg, reassurance re: normal physiologic d/c.   Encounter for other general counseling or advice on contraception--IUD discussed. Pt to f/u if desires. Most likely mirena   GYN counsel adequate intake of calcium and vitamin D, diet and exercise     F/U  No follow-ups on file.  Bernie Fobes B. [place] Markgraf, PA-C 06/25/2022 12:52 PM

## 2022-07-06 ENCOUNTER — Emergency Department
Admission: EM | Admit: 2022-07-06 | Discharge: 2022-07-06 | Disposition: A | Discharge status: Home / Self Care | Payer: No Typology Code available for payment source | Attending: Emergency Medicine | Admitting: Emergency Medicine

## 2022-07-06 ENCOUNTER — Emergency Department: Payer: No Typology Code available for payment source

## 2022-07-06 ENCOUNTER — Other Ambulatory Visit: Payer: Self-pay

## 2022-07-06 DIAGNOSIS — R112 Nausea with vomiting, unspecified: Secondary | ICD-10-CM | POA: Insufficient documentation

## 2022-07-06 DIAGNOSIS — R079 Chest pain, unspecified: Secondary | ICD-10-CM | POA: Diagnosis not present

## 2022-07-06 DIAGNOSIS — R1013 Epigastric pain: Secondary | ICD-10-CM | POA: Insufficient documentation

## 2022-07-06 DIAGNOSIS — K8020 Calculus of gallbladder without cholecystitis without obstruction: Secondary | ICD-10-CM

## 2022-07-06 LAB — CBC
HCT: 41.2 % (ref 36.0–46.0)
Hemoglobin: 13.6 g/dL (ref 12.0–15.0)
MCH: 31.6 pg (ref 26.0–34.0)
MCHC: 33 g/dL (ref 30.0–36.0)
MCV: 95.8 fL (ref 80.0–100.0)
Platelets: 233 10*3/uL (ref 150–400)
RBC: 4.3 MIL/uL (ref 3.87–5.11)
RDW: 14.1 % (ref 11.5–15.5)
WBC: 5.4 10*3/uL (ref 4.0–10.5)
nRBC: 0 % (ref 0.0–0.2)

## 2022-07-06 LAB — HEPATIC FUNCTION PANEL
ALT: 14 U/L (ref 0–44)
AST: 21 U/L (ref 15–41)
Albumin: 4 g/dL (ref 3.5–5.0)
Alkaline Phosphatase: 47 U/L (ref 38–126)
Bilirubin, Direct: <0.1 mg/dL (ref 0.0–0.2)
Total Bilirubin: 0.5 mg/dL (ref 0.3–1.2)
Total Protein: 7.6 g/dL (ref 6.5–8.1)

## 2022-07-06 LAB — BASIC METABOLIC PANEL
Anion gap: 10 (ref 5–15)
BUN: 23 mg/dL — ABNORMAL HIGH (ref 6–20)
CO2: 22 mmol/L (ref 22–32)
Calcium: 9.2 mg/dL (ref 8.9–10.3)
Chloride: 105 mmol/L (ref 98–111)
Creatinine, Ser: 0.73 mg/dL (ref 0.44–1.00)
GFR, Estimated: >60 mL/min (ref >60)
Glucose, Bld: 107 mg/dL — ABNORMAL HIGH (ref 70–99)
Potassium: 4.1 mmol/L (ref 3.5–5.1)
Sodium: 137 mmol/L (ref 135–145)

## 2022-07-06 LAB — TROPONIN I (HIGH SENSITIVITY): Troponin I (High Sensitivity): 3 ng/L (ref <18)

## 2022-07-06 LAB — LIPASE, BLOOD: Lipase: 45 U/L (ref 11–51)

## 2022-07-06 MED ORDER — FENTANYL CITRATE PF 50 MCG/ML IJ SOSY
PREFILLED_SYRINGE | INTRAMUSCULAR | Status: AC
  Filled 2022-07-06: qty 1

## 2022-07-06 MED ORDER — LACTATED RINGERS IV BOLUS
1000.0000 mL | Freq: Once | INTRAVENOUS | Status: AC
  Administered 2022-07-06: 1000 mL via INTRAVENOUS

## 2022-07-06 MED ORDER — KETOROLAC TROMETHAMINE 30 MG/ML IJ SOLN
15.0000 mg | Freq: Once | INTRAMUSCULAR | Status: AC
  Administered 2022-07-06: 15 mg via INTRAVENOUS
  Filled 2022-07-06: qty 1

## 2022-07-06 MED ORDER — HYDROCODONE-ACETAMINOPHEN 5-325 MG PO TABS: 1.0000 | ORAL_TABLET | Freq: Four times a day (QID) | ORAL | 0 refills | Status: DC | PRN

## 2022-07-06 MED ORDER — OXYCODONE-ACETAMINOPHEN 5-325 MG PO TABS: 1.0000 | ORAL_TABLET | Freq: Once | ORAL | Status: DC

## 2022-07-06 MED ORDER — HALOPERIDOL LACTATE 5 MG/ML IJ SOLN: 2.5000 mg | Freq: Once | INTRAMUSCULAR | Status: DC

## 2022-07-06 MED ORDER — FENTANYL CITRATE PF 50 MCG/ML IJ SOSY
50.0000 ug | PREFILLED_SYRINGE | Freq: Once | INTRAMUSCULAR | Status: AC
  Administered 2022-07-06: 50 ug via INTRAVENOUS
  Filled 2022-07-06: qty 1

## 2022-07-06 MED ORDER — ONDANSETRON HCL 4 MG/2ML IJ SOLN
4.0000 mg | Freq: Once | INTRAMUSCULAR | Status: AC
  Administered 2022-07-06: 4 mg via INTRAVENOUS
  Filled 2022-07-06: qty 2

## 2022-07-06 MED ORDER — DROPERIDOL 2.5 MG/ML IJ SOLN
2.5000 mg | Freq: Once | INTRAMUSCULAR | Status: AC
  Administered 2022-07-06: 2.5 mg via INTRAVENOUS
  Filled 2022-07-06: qty 2

## 2022-07-06 MED ORDER — DIPHENHYDRAMINE HCL 50 MG/ML IJ SOLN: 25.0000 mg | Freq: Once | INTRAMUSCULAR | Status: DC

## 2022-07-06 MED ORDER — ONDANSETRON 4 MG PO TBDP: 4.0000 mg | ORAL_TABLET | Freq: Three times a day (TID) | ORAL | 0 refills | Status: DC | PRN

## 2022-07-06 NOTE — ED Notes (Signed)
RN into assess pt's pain. Pt asleep upon arrival. Call light within reach.

## 2022-07-06 NOTE — ED Provider Notes (Signed)
[facility] Provider Note    Event Date/Time   First MD Initiated Contact with Patient 07/06/22 [phone]     (approximate)   History   Chest Pain   HPI  Paige Blanchard is a 27 y.o. female who presents to the ED for evaluation of Chest Pain   Morbidly obese patient presents with mom for evaluation of severe epigastric pain, nausea and vomiting.  Pain woke her from sleep just in the past couple hours.  Nausea and emesis on arrival to the ED.  No chest pain, syncope, diarrhea, urinary changes.  Was "normal" yesterday   Physical Exam   Triage Vital Signs: ED Triage Vitals  Enc Vitals Group     BP 07/06/22 0615 (!) 151/122     Pulse Rate 07/06/22 0615 (!) 102     Resp 07/06/22 0615 (!) 22     Temp 07/06/22 0615 99.1 F (37.3 C)     Temp Source 07/06/22 0615 Oral     SpO2 07/06/22 0615 98 %     Weight 07/06/22 0607 (!) 320 lb (145.2 kg)     Height 07/06/22 0607 5\' 4"  (1.626 m)     Head Circumference --      Peak Flow --      Pain Score 07/06/22 0606 10     Pain Loc --      Pain Edu? --      Excl. in GC? --     Most recent vital signs: Vitals:   07/06/22 0615  BP: (!) 151/122  Pulse: (!) 102  Resp: (!) 22  Temp: 99.1 F (37.3 C)  SpO2: 98%    General: Awake, no distress.  Obviously uncomfortable standing and pacing the room CV:  Good peripheral perfusion.  Resp:  Normal effort.  Abd:  No distention.  Epigastric tenderness without peritoneal features.  Lower abdomen is benign MSK:  No deformity noted.  Neuro:  No focal deficits appreciated. Other:     ED Results / Procedures / Treatments   Labs (all labs ordered are listed, but only abnormal results are displayed) Labs Reviewed  CBC  BASIC METABOLIC PANEL  LIPASE, BLOOD  HEPATIC FUNCTION PANEL  POC URINE PREG, ED  TROPONIN I (HIGH SENSITIVITY)    EKG Sinus rhythm with a rate of 89 bpm.  Normal axis and intervals.  No clear signs of acute ischemia.  RADIOLOGY   Official  radiology report(s): No results found.  PROCEDURES and INTERVENTIONS:  Procedures  Medications  fentaNYL (SUBLIMAZE) 50 MCG/ML injection (has no administration in time range)  fentaNYL (SUBLIMAZE) injection 50 mcg (50 mcg Intravenous Given 07/06/22 0636)  ondansetron (ZOFRAN) injection 4 mg (4 mg Intravenous Given 07/06/22 0636)  lactated ringers bolus 1,000 mL (1,000 mLs Intravenous New Bag/Given 07/06/22 9604)     IMPRESSION / MDM / ASSESSMENT AND PLAN / ED COURSE  I reviewed the triage vital signs and the nursing notes.  Differential diagnosis includes, but is not limited to, biliary colic, gastritis, pancreatitis, pneumothorax  {Patient presents with symptoms of an acute illness or injury that is potentially life-threatening.  Morbidly obese patient presents with severe epigastric discomfort, nausea and emesis.  EKG reassuring and I doubt cardiopulmonary derangements.  We will check blood work and RUQ ultrasound.      FINAL CLINICAL IMPRESSION(S) / ED DIAGNOSES   Final diagnoses:  Epigastric pain     Rx / DC Orders   ED Discharge Orders     None  Note:  This document was prepared using Dragon voice recognition software and may include unintentional dictation errors.   Delton Prairie, MD 07/06/22 [phone]

## 2022-07-06 NOTE — ED Notes (Signed)
RN to answer pt's call light. Pt states they had no nausea from graham crackers and ginger ale.

## 2022-07-06 NOTE — ED Notes (Signed)
Pt provided graham crackers and ginger ale for PO challenge.

## 2022-07-06 NOTE — ED Provider Notes (Signed)
27 yo F here with severe epigastric pain. Pt in significant pain on arrival, given droperidol at time of sign out and has had excellent effect. Toradol given as well and pt has no ongoing pain. US obtained, reviewed, shows stones but no signs of cholecystitis. She has normal LFTs, bili, and WBC - do not suspect cholecystitis or significant symptomatic biliary colic. Lipase is normal. Will give analgesia, diet instructions, and refer to surgery as outpt. No indication for admission at this time. Denies any urinary sx, do not suspect UTI or stone. EKG nonischemic and CXR clear - doubt referred cardiac etiology.   Shaune Pollack, MD 07/06/22 [phone]

## 2022-07-06 NOTE — ED Notes (Signed)
EDP at bedside  

## 2022-07-06 NOTE — Discharge Instructions (Signed)
As we discussed, your symptoms might be due to gallstones.  Follow the diet changes recommended in the paperwork.  Take over-the-counter Ibuprofen 600 mg for mild pain.  Take the prescribed medications for severe pain and nausea as needed.  Follow-up with General Surgery in the next 1-2 weeks  Avoid foods that are high in fat, spicy, or fried

## 2022-07-06 NOTE — ED Triage Notes (Signed)
Pt to ED for centralized chest pain woke pt up out of sleep 30 mins ago. Denies n/v/shob. Pt tearful in triage.  Reports had BM and burped this am with no relief.  Describes pain as burning.  Radiates to back.

## 2022-07-23 ENCOUNTER — Ambulatory Visit: Payer: Self-pay | Admitting: General Surgery

## 2022-07-23 NOTE — H&P (Signed)
PATIENT PROFILE: Paige Blanchard is a 27 y.o. female who presents to the Clinic for consultation at the request of Dr. Eli Phillips for evaluation of cholelithiasis.  PCP:  Herb Grays, MD  HISTORY OF PRESENT ILLNESS: Paige Blanchard reports she had an episode of severe right upper quadrant pain on 07/06/2022.  She endorsed that the pain was right under the rib cage on the right side.  The pain radiated to her right back.  The pain woke her up at night.  She went to the ED for further evaluation and being concerned about heart attacks due to family history of cardiac complications.  At the ED EKG and cardiac workup was negative.  She had an ultrasound of the abdomen that shows cholelithiasis without sign of cholecystitis.  Labs were within normal limits.  She received pain medication and that alleviated the pain.  Patient cannot identify any aggravating factors.  Patient has had 1 more episode of milder pain that he was resolved with oxycodone as well.  I personally evaluated the images of the abdomen ultrasound.   PROBLEM LIST: Problem List  Never Reviewed          Noted   Morbid obesity with BMI of 50.0-59.9 (HCC) (Chronic) 09/18/2015    GENERAL REVIEW OF SYSTEMS:   General ROS: negative for - chills, fatigue, fever, weight gain or weight loss Allergy and Immunology ROS: negative for - hives  Hematological and Lymphatic ROS: negative for - bleeding problems or bruising, negative for palpable nodes Endocrine ROS: negative for - heat or cold intolerance, hair changes Respiratory ROS: negative for - cough, shortness of breath or wheezing Cardiovascular ROS: no chest pain or palpitations GI ROS: negative for nausea, vomiting, diarrhea, constipation.  Positive for abdominal pain Musculoskeletal ROS: negative for - joint swelling or muscle pain Neurological ROS: negative for - confusion, syncope Dermatological ROS: negative for pruritus and rash Psychiatric: negative for anxiety, depression, difficulty  sleeping and memory loss  MEDICATIONS: Current Outpatient Medications  Medication Sig Dispense Refill   FLUoxetine (PROZAC) 20 MG capsule Take 1 capsule by mouth once daily     metFORMIN (GLUCOPHAGE-XR) 500 MG XR tablet Take 500 mg by mouth 2 (two) times daily with meals     No current facility-administered medications for this visit.    ALLERGIES: Peanut and Phentermine  PAST MEDICAL HISTORY: Past Medical History:  Diagnosis Date   Vitamin D deficiency     PAST SURGICAL HISTORY: Past Surgical History:  Procedure Laterality Date   HERNIA REPAIR       FAMILY HISTORY: Family History  Problem Relation Name Age of Onset   Myocardial Infarction (Heart attack) Father       SOCIAL HISTORY: Social History   Socioeconomic History   Marital status: Unknown  Tobacco Use   Smoking status: Never   Smokeless tobacco: Never  Substance and Sexual Activity   Alcohol use: Yes    Comment: socially   Social Determinants of Health   Financial Resource Strain: Low Risk  (06/03/2022)   [facility]   Overall Financial Resource Strain (CARDIA)    Difficulty of Paying Living Expenses: Not hard at all  Food Insecurity: No Food Insecurity (06/03/2022)   Received from [facility]   Hunger Vital Sign    Worried About Running Out of Food in the Last Year: Never true    Ran Out of Food in the Last Year: Never true  Transportation Needs: No Transportation Needs (06/03/2022)   Received  from [facility]   PRAPARE - Transportation    Lack of Transportation (Medical): No    Lack of Transportation (Non-Medical): No    PHYSICAL EXAM: Vitals:   07/23/22 1357  BP: 127/79  Pulse: 102   Body mass index is 56.1 kg/m. Weight: (!) 148.2 kg (326 lb 12.8 oz)   GENERAL: Alert, active, oriented x3  HEENT: Pupils equal reactive to light. Extraocular movements are intact. Sclera clear. Palpebral conjunctiva normal red color.Pharynx clear.  NECK: Supple with no palpable  mass and no adenopathy.  LUNGS: Sound clear with no rales rhonchi or wheezes.  HEART: Regular rhythm S1 and S2 without murmur.  ABDOMEN: Soft and depressible, nontender with no palpable mass, no hepatomegaly.   EXTREMITIES: Well-developed well-nourished symmetrical with no dependent edema.  NEUROLOGICAL: Awake alert oriented, facial expression symmetrical, moving all extremities.  REVIEW OF DATA: I have reviewed the following data today: No visits with results within 3 Month(s) from this visit.  Latest known visit with results is:  Admission on 09/18/2015, Discharged on 09/19/2015  Component Date Value   Color 09/18/2015 Dark Yellow    Clarity 09/18/2015 Cloudy (!)    Specific Gravity 09/18/2015 >=1.030    pH, Urine 09/18/2015 5.0    Protein, Urinalysis 09/18/2015 Trace (!)    Glucose, Urinalysis 09/18/2015 Negative    Ketones, Urinalysis 09/18/2015 1+ (!)    Blood, Urinalysis 09/18/2015 Negative    Nitrite, Urinalysis 09/18/2015 Negative    Leukocytes, Urinalysis 09/18/2015 Negative    Bilirubin, Urinalysis 09/18/2015 1+ (!)    Urobilinogen, Urinalysis 09/18/2015 1.0    Human Chorionic Gonadotr* 09/18/2015 Negative    Cocaine Metabolite, Urine 09/18/2015 Negative    Cannabinoid (THC) Urine 09/18/2015 Negative    Amphetamine 09/18/2015 Negative    Methamphetamines, Urine 09/18/2015 Negative    Barbiturates, Urine 09/18/2015 Negative    Benzodiazepine, Urine 09/18/2015 Negative    Opiates, Urine 09/18/2015 Negative    Phencyclidine (PCP), [place]* 09/18/2015 Negative    Methadone, Urine 09/18/2015 Negative    Oxycodone 09/18/2015 Negative    Propoxyphene, Ur 09/18/2015 Negative    Tricyclic Antidepressant* 09/18/2015 Negative      ASSESSMENT: Paige Blanchard is a 27 y.o. female presenting for consultation for cholelithiasis.    Patient was oriented about the diagnosis of cholelithiasis. Also oriented about what is the gallbladder, its anatomy and function and the implications  of having stones. The patient was oriented about the treatment alternatives (observation vs cholecystectomy). Patient was oriented that a low percentage of patient will continue to have similar pain symptoms even after the gallbladder is removed. Surgical technique (open vs laparoscopic) was discussed. It was also discussed the goals of the surgery (decrease the pain episodes and avoid the risk of cholecystitis) and the risk of surgery including: bleeding, infection, common bile duct injury, stone retention, injury to other organs such as bowel, liver, stomach, other complications such as hernia, bowel obstruction among others. Also discussed with patient about anesthesia and its complications such as: reaction to medications, pneumonia, heart complications, death, among others.   Patient currently being evaluated in [facility] bariatric center.  She is getting workup for obstructive sleep apnea.  She does not know when she is can have her breast surgery.  I discussed with patient that there is an alternative if she feels that the surgery will be soon enough to wait until they do the surgery where they can do the cholecystectomy doing the same surgery.  She does not think that is going to  be that soon and she is scared of having another attack.  She will prefer to proceed with cholecystectomy at this moment on the wait for her parotic surgery.  Cholelithiasis without cholecystitis [K80.20]  PLAN: 1.  Robotic assisted laparoscopic cholecystectomy (13086) 2.  Do not take aspirin 5 days before the procedure 3.  Contact us if has any question or concern.   Patient verbalized understanding, all questions were answered, and were agreeable with the plan outlined above.     Paige Shiver, MD  Electronically signed by Paige Shiver, MD

## 2022-07-27 ENCOUNTER — Inpatient Hospital Stay
Admission: RE | Admit: 2022-07-27 | Discharge: 2022-07-27 | Disposition: A | Discharge status: Home / Self Care | Payer: Medicaid Other | Source: Ambulatory Visit

## 2022-07-27 ENCOUNTER — Encounter: Payer: Self-pay | Admitting: General Surgery

## 2022-07-27 HISTORY — DX: Anxiety disorder, unspecified: F41.9

## 2022-07-27 HISTORY — DX: Obesity, unspecified: E66.9

## 2022-07-27 HISTORY — DX: Depression, unspecified: F32.A

## 2022-07-27 NOTE — Patient Instructions (Addendum)
Your procedure is scheduled on: 07/29/2022  Report to the Registration Desk on the 1st floor of the [facility]. To find out your arrival time, please call [phone] between 1PM - 3PM on: 07/28/2022  If your arrival time is 6:00 am, do not arrive before that time as the [facility] entrance doors do not open until 6:00 am.  REMEMBER: Instructions that are not followed completely may result in serious medical risk, up to and including death; or upon the discretion of your surgeon and anesthesiologist your surgery may need to be rescheduled.  Do not eat food after midnight the night before surgery.  No gum chewing or hard candies.  One week prior to surgery: Stop Anti-inflammatories (NSAIDS) such as Advil, Aleve, Ibuprofen, Motrin, Naproxen, Naprosyn and Aspirin based products such as Excedrin, Goody's Powder, BC Powder. Stop ANY OVER THE COUNTER supplements until after surgery. You may however, continue to take Tylenol if needed for pain up until the day of surgery.  Continue taking all prescribed medications with the exception of the following:                 metFORMIN (GLUCOPHAGE- holf for two days prior to surgery   TAKE ONLY THESE MEDICATIONS THE MORNING OF SURGERY WITH A SIP OF WATER:  FLUoxetine (PROZAC)    No Alcohol for 24 hours before or after surgery.  No Smoking including e-cigarettes for 24 hours before surgery.  No chewable tobacco products for at least 6 hours before surgery.  No nicotine patches on the day of surgery.  Do not use any "recreational" drugs for at least a week (preferably 2 weeks) before your surgery.  Please be advised that the combination of cocaine and anesthesia may have negative outcomes, up to and including death. If you test positive for cocaine, your surgery will be cancelled.  On the morning of surgery brush your teeth with toothpaste and water, you may rinse your mouth with mouthwash if you wish. Do not swallow any toothpaste or  mouthwash.  Use CHG Soap or wipes as directed on instruction sheet.  Do not wear jewelry, make-up, hairpins, clips or nail polish.  Do not wear lotions, powders, or perfumes.   Do not shave body hair from the neck down 48 hours before surgery.  Contact lenses, hearing aids and dentures may not be worn into surgery.  Do not bring valuables to the hospital. [facility] is not responsible for any missing/lost belongings or valuables.   Bring your C-PAP to the hospital in case you may have to spend the night.   Notify your doctor if there is any change in your medical condition (cold, fever, infection).  Wear comfortable clothing (specific to your surgery type) to the hospital.  After surgery, you can help prevent lung complications by doing breathing exercises.  Take deep breaths and cough every 1-2 hours. Your doctor may order a device called an Incentive Spirometer to help you take deep breaths. When coughing or sneezing, hold a pillow firmly against your incision with both hands. This is called "splinting." Doing this helps protect your incision. It also decreases belly discomfort.  If you are being admitted to the hospital overnight, leave your suitcase in the car. After surgery it may be brought to your room.  In case of increased patient census, it may be necessary for you, the patient, to continue your postoperative care in the Same Day Surgery department.  If you are being discharged the day of surgery, you will  not be allowed to drive home. You will need a responsible individual to drive you home and stay with you for 24 hours after surgery.   Please call the Pre-admissions Testing Dept. at [phone] if you have any questions about these instructions.  Surgery Visitation Policy:  Patients having surgery or a procedure may have two visitors.  Children under the age of 49 must have an adult with them who is not the patient.     Preparing for Surgery with  CHLORHEXIDINE GLUCONATE (CHG) Soap  Chlorhexidine Gluconate (CHG) Soap  o An antiseptic cleaner that kills germs and bonds with the skin to continue killing germs even after washing  o Used for showering the night before surgery and morning of surgery  Before surgery, you can play an important role by reducing the number of germs on your skin.  CHG (Chlorhexidine gluconate) soap is an antiseptic cleanser which kills germs and bonds with the skin to continue killing germs even after washing.  Please do not use if you have an allergy to CHG or antibacterial soaps. If your skin becomes reddened/irritated stop using the CHG.  1. Shower the NIGHT BEFORE SURGERY and the MORNING OF SURGERY with CHG soap.  2. If you choose to wash your hair, wash your hair first as usual with your normal shampoo.  3. After shampooing, rinse your hair and body thoroughly to remove the shampoo.  4. Use CHG as you would any other liquid soap. You can apply CHG directly to the skin and wash gently with a scrungie or a clean washcloth.  5. Apply the CHG soap to your body only from the neck down. Do not use on open wounds or open sores. Avoid contact with your eyes, ears, mouth, and genitals (private parts). Wash face and genitals (private parts) with your normal soap.  6. Wash thoroughly, paying special attention to the area where your surgery will be performed.  7. Thoroughly rinse your body with warm water.  8. Do not shower/wash with your normal soap after using and rinsing off the CHG soap.  9. Pat yourself dry with a clean towel.  10. Wear clean pajamas to bed the night before surgery.  12. Place clean sheets on your bed the night of your first shower and do not sleep with pets.  13. Shower again with the CHG soap on the day of surgery prior to arriving at the hospital.  14. Do not apply any deodorants/lotions/powders.  15. Please wear clean clothes to the hospital.

## 2022-07-27 NOTE — Progress Notes (Signed)
Talked to patient and she stated that she will re-schedule the surgery due to insurance issue. I told her that she needs to reach out to Dr.office to re-schedule it. As per PAT purposes, we will re-schedule her session as well.

## 2022-07-29 ENCOUNTER — Ambulatory Visit: Admission: RE | Admit: 2022-07-29 | Payer: Medicaid Other | Source: Home / Self Care | Admitting: General Surgery

## 2022-07-29 ENCOUNTER — Encounter: Admission: RE | Payer: Self-pay | Source: Home / Self Care

## 2022-07-29 HISTORY — DX: Calculus of gallbladder without cholecystitis without obstruction: K80.20

## 2022-07-29 SURGERY — CHOLECYSTECTOMY, ROBOT-ASSISTED, LAPAROSCOPIC
Anesthesia: General | Site: Abdomen

## 2022-10-26 ENCOUNTER — Ambulatory Visit: Payer: No Typology Code available for payment source | Admitting: Internal Medicine

## 2022-10-26 NOTE — Progress Notes (Unsigned)
Sleep Medicine   Office Visit  Patient Name: Paige Blanchard DOB: 06/21/1970 MRN [id number]    Chief Complaint: ***  Brief History:  Paige Blanchard presents for initial sleep consult with a *** history of ***. Sleep quality is ***. This is noted *** nights. The patient's bed partner reports  *** at night. The patient relates the following symptoms: *** are also present. The patient goes to sleep at *** and wakes up at ***. Sleep quality is *** when outside home environment.  Patient has noted *** of his legs at night.  The patient  relates *** behavior during the night.  The patient *** a history of psychiatric problems. The Epworth Sleepiness Score is *** out of 24 .  The patient relates  Cardiovascular risk factors include: *** The patient reports ***    ROS  General: (-) fever, (-) chills, (-) night sweat Nose and Sinuses: (-) nasal stuffiness or itchiness, (-) postnasal drip, (-) nosebleeds, (-) sinus trouble. Mouth and Throat: (-) sore throat, (-) hoarseness. Neck: (-) swollen glands, (-) enlarged thyroid, (-) neck pain. Respiratory: *** cough, *** shortness of breath, *** wheezing. Neurologic: *** numbness, *** tingling. Psychiatric: *** anxiety, *** depression Sleep behavior: ***sleep paralysis ***hypnogogic hallucinations ***dream enactment      ***vivid dreams ***cataplexy ***night terrors ***sleep walking   Current Medication: No outpatient encounter medications on file as of 02/12/2022.   No facility-administered encounter medications on file as of 02/12/2022.    Surgical History: *** The histories are not reviewed yet. Please review them in the "History" navigator section and refresh this [place].  Medical History: No past medical history on file.  Family History: Non contributory to the present illness  Social History: Social History   Socioeconomic History   Marital status: Not on file    Spouse name: Not on file   Number of children: Not on file   Years of  education: Not on file   Highest education level: Not on file  Occupational History   Not on file  Tobacco Use   Smoking status: Not on file   Smokeless tobacco: Not on file  Substance and Sexual Activity   Alcohol use: Not on file   Drug use: Not on file   Sexual activity: Not on file  Other Topics Concern   Not on file  Social History Narrative   Not on file   Social Determinants of Health   Financial Resource Strain: Not on file  Food Insecurity: Not on file  Transportation Needs: Not on file  Physical Activity: Not on file  Stress: Not on file  Social Connections: Not on file  Intimate Partner Violence: Not on file    Vital Signs: There were no vitals taken for this visit. There is no height or weight on file to calculate BMI.   Examination: General Appearance: The patient is well-developed, well-nourished, and in no distress. Neck Circumference: *** Skin: Gross inspection of skin unremarkable. Head: normocephalic, no gross deformities. Eyes: no gross deformities noted. ENT: ears appear grossly normal Neurologic: Alert and oriented. No involuntary movements.    STOP BANG RISK ASSESSMENT S (snore) Have you been told that you snore?     YES/N   T (tired) Are you often tired, fatigued, or sleepy during the day?   YES/NO  O (obstruction) Do you stop breathing, choke, or gasp during sleep? YES/NO   P (pressure) Do you have or are you being treated for high blood pressure? YES/NO   B (BMI) Is  your body index greater than 35 kg/m? YES/NO   A (age) Are you 29 years old or older? YES/NO   N (neck) Do you have a neck circumference greater than 16 inches?   YES/NO   G (gender) Are you a female? YES/NO   TOTAL STOP/BANG "YES" ANSWERS                                                                A STOP-Bang score of 2 or less is considered low risk, and a score of 5 or more is high risk for having either moderate or severe OSA. For people who score 3 or 4,  doctors may need to perform further assessment to determine how likely they are to have OSA.         EPWORTH SLEEPINESS SCALE:  Scale:  (0)= no chance of dozing; (1)= slight chance of dozing; (2)= moderate chance of dozing; (3)= high chance of dozing  Chance  Situtation    Sitting and reading: ***    Watching TV: ***    Sitting Inactive in public: ***    As a passenger in car: ***      Lying down to rest: ***    Sitting and talking: ***    Sitting quielty after lunch: ***    In a car, stopped in traffic: ***   TOTAL SCORE:   *** out of 24    SLEEP STUDIES:  ***   LABS: No results found for this or any previous visit (from the past 2160 hour(s)).  Radiology: Patient was never admitted.  No results found.  No results found.    Assessment and Plan: There are no problems to display for this patient.    PLAN OSA:   Patient evaluation suggests high risk of sleep disordered breathing due to *** Patient has comorbid cardiovascular risk factors including: *** which could be exacerbated by pathologic sleep-disordered breathing.  Suggest: *** to assess/treat the patient's sleep disordered breathing. The patient was also counselled on *** to optimize sleep health.  PLAN hypersomnia:  Patient evaluation suggests significant daytime hypersomnia.  The Epworth Sleepiness Score is elevated at *** out of 24. Patient *** drowsy driving. The patient *** MVA due to sleepiness.  The patient *** restless leg symptoms which exacerbate *** for *** nights per week. The patient *** periodic limb movements which exacerbate ***  for *** nights per week. Suggest: ***  Also suggest ***  PLAN insomnia:  Patient evaluation suggests *** insomnia. This is a chronic disorder. This has been a concern for *** and causes impaired daytime functioning. The patient exhibits comorbid ***  The history *** suggest the insomnia predates the use of hypnotic medications. The symptoms *** with the  discontinuation of these medications. There is no obvious medical, psychiatric or pharmacologic abuse issues ot account for the insomnia.  Treatment recommendations include: *** The patient should maintain a sleep log and calculate total sleep time for 1-2 weeks. Set bed and wake times for achieve 85% sleep efficiency for one week. Once this is achieved  time in bed can be gradually increased. A pharmacologic treatment approach would include a trial of *** for the next ***  months. During this time the patient is to maintain a sleep diary to  track progress.    ***  General Counseling: I have discussed the findings of the evaluation and examination with Paige Blanchard.  I have also discussed any further diagnostic evaluation thatmay be needed or ordered today. Paige Blanchard verbalizes understanding of the findings of todays visit. We also reviewed his medications today and discussed drug interactions and side effects including but not limited excessive drowsiness and altered mental states. We also discussed that there is always a risk not just to him but also people around him. he has been encouraged to call the office with any questions or concerns that should arise related to todays visit.  No orders of the defined types were placed in this encounter.       I have personally obtained a history, evaluated the patient, evaluated pertinent data, formulated the assessment and plan and placed orders.    Allyne Gee, MD [facility] Diplomate ABMS Pulmonary and Critical Care Medicine Sleep medicine

## 2022-11-10 ENCOUNTER — Other Ambulatory Visit (HOSPITAL_COMMUNITY)
Admission: RE | Admit: 2022-11-10 | Discharge: 2022-11-10 | Disposition: A | Discharge status: Home / Self Care | Payer: No Typology Code available for payment source | Source: Ambulatory Visit | Attending: Obstetrics and Gynecology | Admitting: Obstetrics and Gynecology

## 2022-11-10 ENCOUNTER — Ambulatory Visit (INDEPENDENT_AMBULATORY_CARE_PROVIDER_SITE_OTHER): Payer: No Typology Code available for payment source

## 2022-11-10 VITALS — BP 119/75 | HR 72 | Ht 64.0 in | Wt 328.3 lb

## 2022-11-10 DIAGNOSIS — Z01419 Encounter for gynecological examination (general) (routine) without abnormal findings: Secondary | ICD-10-CM | POA: Insufficient documentation

## 2022-11-10 DIAGNOSIS — Z23 Encounter for immunization: Secondary | ICD-10-CM | POA: Diagnosis not present

## 2022-11-10 DIAGNOSIS — Z113 Encounter for screening for infections with a predominantly sexual mode of transmission: Secondary | ICD-10-CM

## 2022-11-10 NOTE — Progress Notes (Signed)
Outpatient Gynecology Note: Annual Visit  Assessment/Plan:    Paige Blanchard is a 27 y.o. female G2P0010 with normal well-woman gynecologic exam.   Well woman exam - Reviewed health maintenance topics as documented below. - Most recent pap smear in 2021, NILM, pap smear collected today.. - STI screening accepted. - Pelvic floor physical therapy referral placed for stress incontinence. - Follows with PCP for other regular ab work.      Orders Placed This Encounter  Procedures   Flu vaccine trivalent PF, 6mos and older(Flulaval,Afluria,Fluarix,Fluzone)   HEP, RPR, HIV Panel   Current Outpatient Medications  Medication Instructions   FLUoxetine (PROZAC) 20 MG capsule 1 capsule, Daily   HYDROcodone-acetaminophen (NORCO/VICODIN) 5-325 MG tablet 1-2 tablets, Oral, Every 6 hours PRN    No follow-ups on file.    Subjective:    Paige Blanchard is a 27 y.o. female G2P0010 who presents for annual wellness visit.   Occupation works for [employer].    Lives with daughter.    CONCERNS? Bladder leakage.   Well Woman Visit:  GYN HISTORY:  Patient's last menstrual period was 10/22/2022 (exact date).     Menstrual History: OB History     Gravida  2   Para  0   Term  0   Preterm  0   AB  1   Living  0      SAB  1   IAB  0   Ectopic  0   Multiple  0   Live Births  0           Menarche age: [age] Patient's last menstrual period was 10/22/2022 (exact date). Period Cycle (Days): 28 Period Duration (Days): 5-7 Period Pattern: Regular Menstrual Flow: Heavy Dysmenorrhea: (!) Severe (first 4 days of cycle) Dysmenorrhea Symptoms: Cramping   Intermenstrual bleeding, spotting, or discharge? no Urinary incontinence? Yes, desires referral for pelvic floor physical therapy  Sexually active: no Number of sexual partners: n/a Gender of sexual Partners: n/a Dyspareunia? N/a Last [id number] date 2021 and was normal  History of abnormal Pap:  no Gardasil series:  thinks she did STI history: gonorrhea STI/HIV testing or immunizations needed? Yes.    Contraceptive methods: no method, abstinence  Health Maintenance > Reviewed breast self-awareness, no family history of breast cancer > History of abnormal mammogram: No > Exercise: walking, moderately active > Body mass index is 56.35 kg/m.  Working with PCP on bariatric surgery > Recent dental visit No. > Seat Belt Use: Yes.   > Texting and driving? No. > Guns in the house No. > Concern for alcohol abuse? One glass of wine per week   Tobacco or other drug use: denied. Tobacco Use: Low Risk  (11/10/2022)   Patient History    Smoking Tobacco Use: Never    Smokeless Tobacco Use: Never    Passive Exposure: Not on file     Mental Health screening: has therapist she sees regularly.   _________________________________________________________  Current Outpatient Medications  Medication Sig Dispense Refill   HYDROcodone-acetaminophen (NORCO/VICODIN) 5-325 MG tablet Take 1-2 tablets by mouth every 6 (six) hours as needed for moderate pain or severe pain (no more than 6 tabs daily). 12 tablet 0   FLUoxetine (PROZAC) 20 MG capsule Take 1 capsule by mouth daily. (Patient not taking: Reported on 11/10/2022)     No current facility-administered medications for this visit.   Allergies  Allergen Reactions   Peanut Oil Hives   Peanut-Containing Drug Products Hives  Phentermine Anxiety    Anxiety and panic attacks    Past Medical History:  Diagnosis Date   Anxiety    Cholelithiasis without cholecystitis    Depression    Dysmenorrhea    Migraine with aura    Obesity    Vitamin D deficiency    Past Surgical History:  Procedure Laterality Date   abcessed tooth  03/14/2017   drained in ED of [facility]   HERNIA REPAIR     AGE 16   TONSILLECTOMY AND ADENOIDECTOMY     TUBES IN EAR     WISDOM TOOTH EXTRACTION  01/2017   OB History     Gravida  2   Para  0   Term  0    Preterm  0   AB  1   Living  0      SAB  1   IAB  0   Ectopic  0   Multiple  0   Live Births  0          Social History   Tobacco Use   Smoking status: Never   Smokeless tobacco: Never  Substance Use Topics   Alcohol use: Yes    Comment: socially   Social History   Substance and Sexual Activity  Sexual Activity Not Currently   Birth control/protection: Condom    Immunization History  Administered Date(s) Administered   HPV Quadrivalent 08/18/2012, 02/20/2013   Hepatitis A, Adult 08/18/2012   Influenza, Seasonal, Injecte, Preservative Fre 11/10/2022   Influenza,inj,Quad PF,6+ Mos 12/11/2011, 10/10/2013, 09/28/2014, 10/25/2015, 11/18/2016, 02/22/2018   Influenza,inj,quad, With Preservative 09/28/2014   Influenza-Unspecified 12/22/2016   Moderna Sars-Covid-2 Vaccination 08/07/2020   Tdap 08/10/2008, 01/05/2017     Review Of Systems  Constitutional: Denied constitutional symptoms, night sweats, recent illness, fatigue, fever, insomnia and weight loss.  Eyes: Denied eye symptoms, eye pain, photophobia, vision change and visual disturbance.  Ears/Nose/Throat/Neck: Denied ear, nose, throat or neck symptoms, hearing loss, nasal discharge, sinus congestion and sore throat.  Cardiovascular: Denied cardiovascular symptoms, arrhythmia, chest pain/pressure, edema, exercise intolerance, orthopnea and palpitations.  Respiratory: Denied pulmonary symptoms, asthma, pleuritic pain, productive sputum, cough, dyspnea and wheezing.  Gastrointestinal: Denied, gastro-esophageal reflux, melena, nausea and vomiting.  Genitourinary: Denied genitourinary symptoms including symptomatic vaginal discharge, pelvic relaxation issues, and urinary complaints.  Musculoskeletal: Denied musculoskeletal symptoms, stiffness, swelling, muscle weakness and myalgia.  Dermatologic: Denied dermatology symptoms, rash and scar.  Neurologic: Denied neurology symptoms, dizziness, headache, neck pain and  syncope.  Psychiatric: Denied psychiatric symptoms, anxiety and depression.  Endocrine: Denied endocrine symptoms including hot flashes and night sweats.      Objective:    BP 119/75   Pulse 72   Ht 5\' 4"  (1.626 m)   Wt (!) 328 lb 4.8 oz (148.9 kg)   LMP 10/22/2022 (Exact Date)   Breastfeeding No   BMI 56.35 kg/m   Constitutional: Well-developed, well-nourished female in no acute distress Neurological: Alert and oriented to person, place, and time Psychiatric: Mood and affect appropriate Skin: No rashes or lesions Neck: Supple without masses. Trachea is midline.Thyroid is normal size without masses Lymphatics: No cervical, axillary, supraclavicular, or inguinal adenopathy noted Respiratory: Clear to auscultation bilaterally. Good air movement with normal work of breathing. Cardiovascular: Regular rate and rhythm. Extremities grossly normal, nontender with no edema; pulses regular Gastrointestinal: Soft, nontender, nondistended. No masses or hernias appreciated. No hepatosplenomegaly. No fluid wave. No rebound or guarding. Breast Exam: normal appearance, no masses or tenderness, small area of dry  rash on right breast 12:00 and 3:00 Genitourinary:         External Genitalia: Normal female genitalia    Vagina: well-rugated, no lesions.    Cervix: No lesions, normal size and consistency; no cervical motion tenderness  Adnexae: Non-palpable and non-tender Perineum/Anus: No lesions Rectal: deferred    Autumn Messing, CNM  11/10/22 12:14 PM

## 2022-11-10 NOTE — Assessment & Plan Note (Addendum)
-   Reviewed health maintenance topics as documented below. - Most recent pap smear in 2021, NILM, pap smear collected today.. - STI screening accepted. - Pelvic floor physical therapy referral placed for stress incontinence. - Follows with PCP for other regular ab work.

## 2022-11-11 LAB — HEP, RPR, HIV PANEL
HIV Screen 4th Generation wRfx: NONREACTIVE
Hepatitis B Surface Ag: NEGATIVE
RPR Ser Ql: NONREACTIVE

## 2022-11-13 LAB — CYTOLOGY - PAP
Chlamydia: NEGATIVE
Comment: NEGATIVE
Comment: NEGATIVE
Comment: NORMAL
Diagnosis: NEGATIVE
Neisseria Gonorrhea: NEGATIVE
Trichomonas: NEGATIVE

## 2023-01-01 NOTE — Progress Notes (Unsigned)
[facility] [address] [place], [place] [address]  Pulmonary Sleep Medicine   Office Visit Note  Patient Name: Paige Blanchard DOB: [birth year] MRN [id number]    Chief Complaint: Obstructive Sleep Apnea visit  Brief History:  Paige Blanchard is seen today for an initial consult for CPAP@ 8 cmH2O. The patient has a 5 year history of sleep apnea. Patient is not using PAP nightly.  The patient feels *** after sleeping with PAP.  The patient reports *** from PAP use. Reported sleepiness is  *** and the Epworth Sleepiness Score is *** out of 24. The patient *** take naps. The patient complains of the following: ***  The compliance download shows 1% compliance with an average use time of 1 hour 30 minutes. The AHI is 2.4.  The patient *** of limb movements disrupting sleep. The patient continues to require PAP therapy in order to eliminate sleep apnea.   ROS  General: (-) fever, (-) chills, (-) night sweat Nose and Sinuses: (-) nasal stuffiness or itchiness, (-) postnasal drip, (-) nosebleeds, (-) sinus trouble. Mouth and Throat: (-) sore throat, (-) hoarseness. Neck: (-) swollen glands, (-) enlarged thyroid, (-) neck pain. Respiratory: *** cough, *** shortness of breath, *** wheezing. Neurologic: *** numbness, *** tingling. Psychiatric: *** anxiety, *** depression   Current Medication: Outpatient Encounter Medications as of 01/04/2023  Medication Sig   FLUoxetine (PROZAC) 20 MG capsule Take 1 capsule by mouth daily. (Patient not taking: Reported on 11/10/2022)   HYDROcodone-acetaminophen (NORCO/VICODIN) 5-325 MG tablet Take 1-2 tablets by mouth every 6 (six) hours as needed for moderate pain or severe pain (no more than 6 tabs daily).   No facility-administered encounter medications on file as of 01/04/2023.    Surgical History: Past Surgical History:  Procedure Laterality Date   abcessed tooth  03/14/2017   drained in ED of [facility]   HERNIA REPAIR     AGE 27   TONSILLECTOMY  AND ADENOIDECTOMY     TUBES IN EAR     WISDOM TOOTH EXTRACTION  01/2017    Medical History: Past Medical History:  Diagnosis Date   Anxiety    Cholelithiasis without cholecystitis    Depression    Dysmenorrhea    Migraine with aura    Obesity    Vitamin D deficiency     Family History: Non contributory to the present illness  Social History: Social History   Socioeconomic History   Marital status: Single    Spouse name: Not on file   Number of children: 0   Years of education: Not on file   Highest education level: Not on file  Occupational History   Occupation: Dentist: [employer]  Tobacco Use   Smoking status: Never   Smokeless tobacco: Never  Vaping Use   Vaping status: Never Used  Substance and Sexual Activity   Alcohol use: Yes    Comment: socially   Drug use: No   Sexual activity: Not Currently    Birth control/protection: Condom  Other Topics Concern   Not on file  Social History Narrative   Not on file   Social Drivers of Health   Financial Resource Strain: Low Risk  (06/03/2022)   Received from [facility], [facility]   Overall Financial Resource Strain (CARDIA)    Difficulty of Paying Living Expenses: Not hard at all  Food Insecurity: No Food Insecurity (06/03/2022)   Received from [facility], [facility]  Hunger Vital Sign    Worried About Running Out of Food in the Last Year: Never true    Ran Out of Food in the Last Year: Never true  Transportation Needs: No Transportation Needs (06/03/2022)   Received from [facility], [facility]   [facility] - Transportation    Lack of Transportation (Medical): No    Lack of Transportation (Non-Medical): No  Physical Activity: Sufficiently Active (04/27/2019)   Received from [facility], [facility]   Exercise Vital Sign    Days of Exercise per Week: 3 days    Minutes of Exercise per Session: 60 min  Stress: No Stress Concern Present (04/27/2019)    Received from [facility], [facility] - Occupational Stress Questionnaire    Feeling of Stress : Not at all  Social Connections: Moderately Isolated (04/27/2019)   Received from [facility], [facility]   Social Connection and Isolation Panel [NHANES]    Frequency of Communication with Friends and Family: More than three times a week    Frequency of Social Gatherings with Friends and Family: More than three times a week    Attends Religious Services: More than 4 times per year    Active Member of [employer] or Organizations: No    Attends Banker Meetings: Never    Marital Status: Never married  Intimate Partner Violence: Not At Risk (04/27/2019)   Received from [facility], [facility]   Humiliation, Afraid, Rape, and Kick questionnaire    Fear of Current or Ex-Partner: No    Emotionally Abused: No    Physically Abused: No    Sexually Abused: No    Vital Signs: There were no vitals taken for this visit. There is no height or weight on file to calculate BMI.    Examination: General Appearance: The patient is well-developed, well-nourished, and in no distress. Neck Circumference: *** Skin: Gross inspection of skin unremarkable. Head: normocephalic, no gross deformities. Eyes: no gross deformities noted. ENT: ears appear grossly normal Neurologic: Alert and oriented. No involuntary movements.  STOP BANG RISK ASSESSMENT S (snore) Have you been told that you snore?     YES   T (tired) Are you often tired, fatigued, or sleepy during the day?   YES/NO  O (obstruction) Do you stop breathing, choke, or gasp during sleep? YES/NO   P (pressure) Do you have or are you being treated for high blood pressure? YES/NO   B (BMI) Is your body index greater than 35 kg/m? YES/NO   A (age) Are you 70 years old or older? NO   N (neck) Do you have a neck circumference greater than 16 inches?   YES/NO   G (gender)  Are you a female? NO   TOTAL STOP/BANG "YES" ANSWERS        A STOP-Bang score of 2 or less is considered low risk, and a score of 5 or more is high risk for having either moderate or severe OSA. For people who score 3 or 4, doctors may need to perform further assessment to determine how likely they are to have OSA.         EPWORTH SLEEPINESS SCALE:  Scale:  (0)= no chance of dozing; (1)= slight chance of dozing; (2)= moderate chance of dozing; (3)= high chance of dozing  Chance  Situtation    Sitting and reading: ***    Watching TV: ***  Sitting Inactive in public: ***    As a passenger in car: ***      Lying down to rest: ***    Sitting and talking: ***    Sitting quielty after lunch: ***    In a car, stopped in traffic: ***   TOTAL SCORE:   *** out of 24    SLEEP STUDIES:  PSG (12/2017 at [facility]) AHI 22/hr, Supine AHI 35.28/hr, min SpO2 84% PSG (04/2022 at [facility]) AHI 14/hr, min Spo2 78% Titration (05/2022) CPAP@ 8 cmH2O   CPAP COMPLIANCE DATA:  Date Range: 07/29/2022-11/17/2022  Average Daily Use: 1 hour 30 minutes  Median Use: 1 hour 8 minutes  Compliance for > 4 Hours: 1%  AHI: 2.4 respiratory events per hour  Days Used: 22/112 days  Mask Leak: 7  95th Percentile Pressure: 8         LABS: Recent Results (from the past 2160 hours)  Cytology - PAP     Status: None   Collection Time: 11/10/22  9:38 AM  Result Value Ref Range   Neisseria Gonorrhea Negative    Chlamydia Negative    Trichomonas Negative    Adequacy      Satisfactory for evaluation; transformation zone component PRESENT.   Diagnosis      - Negative for intraepithelial lesion or malignancy (NILM)   Comment Normal Reference Range Trichomonas - Negative    Comment Normal Reference Ranger Chlamydia - Negative    Comment      Normal Reference Range Neisseria Gonorrhea - Negative  HEP, RPR, HIV Panel     Status: None   Collection Time: 11/10/22  9:58 AM  Result Value Ref Range    Hepatitis B Surface Ag Negative Negative   RPR Ser Ql Non Reactive Non Reactive   HIV Screen 4th Generation wRfx Non Reactive Non Reactive    Comment: HIV-1/HIV-2 antibodies and HIV-1 p24 antigen were NOT detected. There is no laboratory evidence of HIV infection. HIV Negative     Radiology: No results found.  No results found.  No results found.    Assessment and Plan: Patient Active Problem List   Diagnosis Date Noted   Well woman exam 11/10/2022   Mastodynia 05/26/2017   LGSIL on Pap smear of cervix 05/21/2017   Recurrent major depressive disorder (HCC) 10/26/2014   Peanut allergy 11/24/2013   Class 3 severe obesity due to excess calories without serious comorbidity with body mass index (BMI) of 50.0 to 59.9 in adult [facility]) 11/14/2013   Migraine 12/17/2011      The patient *** tolerate PAP and reports *** benefit from PAP use. The patient was reminded how to *** and advised to ***. The patient was also counselled on ***. The compliance is ***. The AHI is ***.   ***  General Counseling: I have discussed the findings of the evaluation and examination with [facility].  I have also discussed any further diagnostic evaluation thatmay be needed or ordered today. Romell verbalizes understanding of the findings of todays visit. We also reviewed her medications today and discussed drug interactions and side effects including but not limited excessive drowsiness and altered mental states. We also discussed that there is always a risk not just to her but also people around her. she has been encouraged to call the office with any questions or concerns that should arise related to todays visit.  No orders of the defined types were placed in this encounter.       I have personally  obtained a history, examined the patient, evaluated laboratory and imaging results, formulated the assessment and plan and placed orders.  Yevonne Pax, MD [facility] Diplomate ABMS Pulmonary Critical Care  Medicine and Sleep Medicine

## 2023-01-04 ENCOUNTER — Ambulatory Visit: Payer: No Typology Code available for payment source | Admitting: Internal Medicine

## 2023-03-11 ENCOUNTER — Ambulatory Visit: Payer: No Typology Code available for payment source | Admitting: Obstetrics and Gynecology

## 2023-03-12 ENCOUNTER — Ambulatory Visit: Payer: No Typology Code available for payment source

## 2023-03-29 ENCOUNTER — Ambulatory Visit: Admitting: Obstetrics

## 2023-03-29 DIAGNOSIS — N750 Cyst of Bartholin's gland: Secondary | ICD-10-CM

## 2023-03-29 DIAGNOSIS — L0292 Furuncle, unspecified: Secondary | ICD-10-CM

## 2023-03-30 ENCOUNTER — Encounter: Payer: Self-pay | Admitting: Obstetrics

## 2023-06-04 ENCOUNTER — Ambulatory Visit (INDEPENDENT_AMBULATORY_CARE_PROVIDER_SITE_OTHER)

## 2023-06-04 VITALS — BP 105/74 | HR 75 | Ht 64.0 in | Wt 323.0 lb

## 2023-06-04 DIAGNOSIS — N946 Dysmenorrhea, unspecified: Secondary | ICD-10-CM

## 2023-06-04 NOTE — Progress Notes (Signed)
   GYN ENCOUNTER  Encounter for Dysmenorrhea   Subjective  HPI: Paige Blanchard is a 28 y.o. G2P0010 who presents today for heavy periods that are regular about every 27 days lasting about 4-5 days. She reports heavy bleeding for the first four days changing a pad every two hours with painful cramping.   She has concerns for PCOS due to heavy periods.  Past Medical History:  Diagnosis Date   Anxiety    Cholelithiasis without cholecystitis    Depression    Dysmenorrhea    Migraine with aura    Obesity    Vitamin D deficiency    Past Surgical History:  Procedure Laterality Date   abcessed tooth  03/14/2017   drained in ED of [facility]   HERNIA REPAIR     AGE 30   TONSILLECTOMY AND ADENOIDECTOMY     TUBES IN EAR     WISDOM TOOTH EXTRACTION  01/2017   OB History     Gravida  2   Para  0   Term  0   Preterm  0   AB  1   Living  0      SAB  1   IAB  0   Ectopic  0   Multiple  0   Live Births  0          Allergies  Allergen Reactions   Peanut Oil Hives   Peanut-Containing Drug Products Hives   Phentermine Anxiety    Anxiety and panic attacks    Constitutional: Denied constitutional symptoms, night sweats, recent illness, fatigue, fever, insomnia and weight loss.  Eyes: Denied eye symptoms, eye pain, photophobia, vision change and visual disturbance.  Ears/Nose/Throat/Neck: Denied ear, nose, throat or neck symptoms, hearing loss, nasal discharge, sinus congestion and sore throat.  Cardiovascular: Denied cardiovascular symptoms, arrhythmia, chest pain/pressure, edema, exercise intolerance, orthopnea and palpitations.  Respiratory: Denied pulmonary symptoms, asthma, pleuritic pain, productive sputum, cough, dyspnea and wheezing.  Gastrointestinal: Denied, gastro-esophageal reflux, melena, nausea and vomiting.  Genitourinary: Denied genitourinary symptoms including symptomatic vaginal discharge, pelvic relaxation issues, and urinary complaints.   Musculoskeletal: Denied musculoskeletal symptoms, stiffness, swelling, muscle weakness and myalgia.  Dermatologic: Denied dermatology symptoms, rash and scar.  Neurologic: Denied neurology symptoms, dizziness, headache, neck pain and syncope.  Psychiatric: Denied psychiatric symptoms, anxiety and depression.  Endocrine: Denied endocrine symptoms including hot flashes and night sweats.    Objective  BP 105/74   Pulse 75   Ht 5\' 4"  (1.626 m)   Wt (!) 146.5 kg   LMP 05/28/2023   BMI 55.44 kg/m   Physical examination   Pelvic: deffered  Vulva:   Vagina:   Support:   Urethra   Meatus   Cervix:   Anus:   Perineum:         Bimanual Deferred   Uterus:   Adnexae:   Cul-de-sac:     Assessment/Plan  Discussed regularity of cycles and provided education on the menstrual cycle. Discussed low suspicion of PCOS based on regularity of cycles. Discussed treatment options for heavy bleeding and that she may start taking 600 mg ibuprofen  every 6 hours starting one day before period begins and continuing through the first couple of days. Discussed taking the ibuprofen  through a few cycles, and if it is ineffective may consider hormonal contraceptive. F/u as needed.     Rosealee Concha, SNM

## 2024-01-19 ENCOUNTER — Other Ambulatory Visit: Payer: Self-pay

## 2024-01-24 ENCOUNTER — Institutional Professional Consult (permissible substitution) (INDEPENDENT_AMBULATORY_CARE_PROVIDER_SITE_OTHER): Admitting: Physician Assistant

## 2024-01-24 NOTE — Progress Notes (Unsigned)
 " Office: [phone]  /  Fax: [phone]   Initial Visit    Paige Blanchard was seen in clinic today to evaluate for obesity. She is interested in losing weight to improve overall health and reduce the risk of weight related complications. She presents today to review program treatment options, initial physical assessment, and evaluation.     She was referred by: {emreferby:28303}  When asked what else they would like to accomplish? She states: {EMHopetoaccomplish:28304::Adopt a healthier eating pattern and lifestyle,Improve energy levels and physical activity,Improve existing medical conditions,Improve quality of life}  When asked how has your weight affected you? She states: {EMWeightAffected:28305}  Weight history: ***  Highest weight: ***  Some associated conditions: {EMSomeConditions:28306}  Contributing factors: {EMcontributingfactors:28307}  Weight promoting medications identified: {EMWeightpromotingrx:28308}  Prior weight loss attempts: {emweightlossprograms:31590::None}  Current nutrition plan: {EMNutritionplan:28309::None}  Current level of physical activity: {EMcurrentPA:28310::None}  Current or previous pharmacotherapy: {EM previousRx:28311}  Response to medication: {EMResponsetomedication:28312}   Past medical history includes:   Past Medical History:  Diagnosis Date   Anxiety    Cholelithiasis without cholecystitis    Depression    Dysmenorrhea    Migraine with aura    Obesity    Vitamin D deficiency      Objective    There were no vitals taken for this visit. She was weighed on the bioimpedance scale: There is no height or weight on file to calculate BMI.  Body Fat%:***, Visceral Fat Rating:***, Weight trend over the last 12 months: {emweighttrend:28333}  General:  Alert, oriented and cooperative. Patient is in no acute distress.  Respiratory: Normal respiratory effort, no problems with respiration noted   Gait: able to ambulate  independently  Mental Status: Normal mood and affect. Normal behavior. Normal judgment and thought content.   DIAGNOSTIC DATA REVIEWED:  BMET    Component Value Date/Time   NA 137 07/06/2022 0609   NA 140 05/18/2012 1313   K 4.1 07/06/2022 0609   K 4.1 05/18/2012 1313   CL 105 07/06/2022 0609   CL 110 (H) 05/18/2012 1313   CO2 22 07/06/2022 0609   CO2 26 (H) 05/18/2012 1313   GLUCOSE 107 (H) 07/06/2022 0609   GLUCOSE 89 05/18/2012 1313   BUN 23 (H) 07/06/2022 0609   BUN 14 05/18/2012 1313   CREATININE 0.73 07/06/2022 0609   CREATININE 0.53 (L) 05/18/2012 1313   CALCIUM  9.2 07/06/2022 0609   CALCIUM  9.0 05/18/2012 1313   GFRNONAA >60 07/06/2022 0609   GFRAA >60 06/12/2018 0827   No results found for: HGBA1C No results found for: INSULIN CBC    Component Value Date/Time   WBC 5.4 07/06/2022 0609   RBC 4.30 07/06/2022 0609   HGB 13.6 07/06/2022 0609   HGB 12.7 02/22/2018 1513   HCT 41.2 07/06/2022 0609   HCT 36.8 02/22/2018 1513   PLT 233 07/06/2022 0609   PLT 212 02/22/2018 1513   MCV 95.8 07/06/2022 0609   MCV 93 02/22/2018 1513   MCV 93 05/18/2012 1313   MCH 31.6 07/06/2022 0609   MCHC 33.0 07/06/2022 0609   RDW 14.1 07/06/2022 0609   RDW 13.4 02/22/2018 1513   RDW 13.2 05/18/2012 1313   Iron/TIBC/Ferritin/ %Sat No results found for: IRON, TIBC, FERRITIN, IRONPCTSAT Lipid Panel  No results found for: CHOL, TRIG, HDL, CHOLHDL, VLDL, LDLCALC, LDLDIRECT Hepatic Function Panel     Component Value Date/Time   PROT 7.6 07/06/2022 0624   PROT 7.5 05/18/2012 1313   ALBUMIN 4.0 07/06/2022 0624  ALBUMIN 3.6 (L) 05/18/2012 1313   AST 21 07/06/2022 0624   AST 32 (H) 05/18/2012 1313   ALT 14 07/06/2022 0624   ALT 20 05/18/2012 1313   ALKPHOS 47 07/06/2022 0624   ALKPHOS 58 (L) 05/18/2012 1313   BILITOT 0.5 07/06/2022 0624   BILITOT 0.3 05/18/2012 1313   BILIDIR <0.1 07/06/2022 0624   IBILI NOT CALCULATED 07/06/2022 0624   No results  found for: TSH   Assessment and Plan   There are no diagnoses linked to this encounter.  Assessment and Plan Assessment & Plan         Obesity Treatment / Action Plan:  {EMobesityactionplanscribe:28314::Patient will work on garnering support from family and friends to begin weight loss journey.,Will work on eliminating or reducing the presence of highly palatable, calorie dense foods in the home.,Will complete provided nutritional and psychosocial assessment questionnaire before the next appointment.,Will be scheduled for indirect calorimetry to determine resting energy expenditure in a fasting state.  This will allow us  to create a reduced calorie, high-protein meal plan to promote loss of fat mass while preserving muscle mass.,Counseled on the health benefits of losing 5%-15% of total body weight.,Was counseled on nutritional approaches to weight loss and benefits of reducing processed foods and consuming plant-based foods and high quality protein as part of nutritional weight management.,Was counseled on pharmacotherapy and role as an adjunct in weight management. }  Obesity Education Performed Today:  She was weighed on the bioimpedance scale and results were discussed and documented in the synopsis.  We discussed obesity as a disease and the importance of a more detailed evaluation of all the factors contributing to the disease.  We discussed the importance of long term lifestyle changes which include nutrition, exercise and behavioral modifications as well as the importance of customizing this to her specific health and social needs.  We discussed the benefits of reaching a healthier weight to alleviate the symptoms of existing conditions and reduce the risks of the biomechanical, metabolic and psychological effects of obesity.  We reviewed the four pillars of obesity medicine and importance of using a multimodal approach.  We reviewed the basic principles in  weight management.   Paige Blanchard appears to be in the action stage of change and states they are ready to start intensive lifestyle modifications and behavioral modifications.  I have spent *** minutes in the care of the patient today including: {NUMBER 1-10:22536} minutes before the visit reviewing and preparing the chart. *** minutes face-to-face {emfacetoface:32598::assessing and reviewing listed medical problems as outlined in obesity care plan,providing nutritional and behavioral counseling on topics outlined in the obesity care plan,independently interpreting test results and goals of care, as described in assessment and plan,reviewing and discussing biometric information and progress} {NUMBER 1-10:22536} minutes after the visit updating chart and documentation of encounter.  Reviewed by clinician on day of visit: allergies, medications, problem list, medical history, surgical history, family history, social history, and previous encounter notes pertinent to obesity diagnosis.   Loa Idler,PA-C     "
